# Patient Record
Sex: Male | Born: 1955 | Race: Black or African American | Hispanic: No | Marital: Married | Smoking: Former smoker
Health system: Southern US, Community
[De-identification: ages and names within clinical notes are randomized; demographics above are authoritative.]

## PROBLEM LIST (undated history)

## (undated) DIAGNOSIS — M1990 Unspecified osteoarthritis, unspecified site: Secondary | ICD-10-CM

## (undated) DIAGNOSIS — Z5189 Encounter for other specified aftercare: Secondary | ICD-10-CM

## (undated) DIAGNOSIS — T7840XA Allergy, unspecified, initial encounter: Secondary | ICD-10-CM

## (undated) DIAGNOSIS — R011 Cardiac murmur, unspecified: Secondary | ICD-10-CM

## (undated) DIAGNOSIS — E785 Hyperlipidemia, unspecified: Secondary | ICD-10-CM

## (undated) DIAGNOSIS — J45909 Unspecified asthma, uncomplicated: Secondary | ICD-10-CM

## (undated) DIAGNOSIS — I10 Essential (primary) hypertension: Secondary | ICD-10-CM

## (undated) DIAGNOSIS — K5900 Constipation, unspecified: Secondary | ICD-10-CM

## (undated) HISTORY — DX: Constipation, unspecified: K59.00

## (undated) HISTORY — DX: Essential (primary) hypertension: I10

## (undated) HISTORY — DX: Allergy, unspecified, initial encounter: T78.40XA

## (undated) HISTORY — PX: APPENDECTOMY: SHX54

## (undated) HISTORY — DX: Encounter for other specified aftercare: Z51.89

## (undated) HISTORY — PX: TONSILLECTOMY: SUR1361

## (undated) HISTORY — PX: KNEE SURGERY: SHX244

## (undated) HISTORY — DX: Unspecified osteoarthritis, unspecified site: M19.90

## (undated) HISTORY — DX: Hyperlipidemia, unspecified: E78.5

## (undated) HISTORY — PX: OTHER SURGICAL HISTORY: SHX169

## (undated) HISTORY — DX: Cardiac murmur, unspecified: R01.1

## (undated) HISTORY — DX: Unspecified asthma, uncomplicated: J45.909

---

## 1980-04-30 HISTORY — PX: KNEE SURGERY: SHX244

## 2005-07-21 ENCOUNTER — Emergency Department (HOSPITAL_COMMUNITY): Admission: EM | Admit: 2005-07-21 | Discharge: 2005-07-21 | Payer: Self-pay | Admitting: Emergency Medicine

## 2006-04-30 HISTORY — PX: COLONOSCOPY: SHX174

## 2007-02-06 ENCOUNTER — Ambulatory Visit: Payer: Self-pay | Admitting: Gastroenterology

## 2007-02-17 ENCOUNTER — Ambulatory Visit: Payer: Self-pay | Admitting: Gastroenterology

## 2009-04-15 ENCOUNTER — Encounter (INDEPENDENT_AMBULATORY_CARE_PROVIDER_SITE_OTHER): Payer: Self-pay | Admitting: *Deleted

## 2009-05-09 DIAGNOSIS — E669 Obesity, unspecified: Secondary | ICD-10-CM | POA: Insufficient documentation

## 2009-05-09 DIAGNOSIS — Z8679 Personal history of other diseases of the circulatory system: Secondary | ICD-10-CM | POA: Insufficient documentation

## 2009-05-09 DIAGNOSIS — K921 Melena: Secondary | ICD-10-CM | POA: Insufficient documentation

## 2009-05-09 DIAGNOSIS — I10 Essential (primary) hypertension: Secondary | ICD-10-CM | POA: Insufficient documentation

## 2009-05-12 ENCOUNTER — Ambulatory Visit: Payer: Self-pay | Admitting: Gastroenterology

## 2009-05-17 ENCOUNTER — Ambulatory Visit: Payer: Self-pay | Admitting: Gastroenterology

## 2009-05-17 LAB — CONVERTED CEMR LAB: Fecal Occult Bld: NEGATIVE

## 2010-06-01 NOTE — Procedures (Signed)
Summary: Colon   Colonoscopy  Procedure date:  02/17/2007  Findings:      Location:  [facility].   Patient Name: Zachary [name], Zachary [name] MRN:  Procedure Procedures: Colonoscopy CPT: [phone].  Personnel: Endoscopist: Vania Rea. Jarold Motto, MD.  Referred By: Ellamae Sia, MD.  Exam Location: Exam performed in Outpatient Clinic. Outpatient  Patient Consent: Procedure, Alternatives, Risks and Benefits discussed, consent obtained, from patient. Consent was obtained by the RN.  Indications  Increased Risk Screening: For family history of colorectal neoplasia, in  grandparent  History  Current Medications: Patient is not currently taking Coumadin.  Medical/ Surgical History: Hypertension,  Pre-Exam Physical: Performed Feb 17, 2007. Cardio-pulmonary exam, Rectal exam, Abdominal exam, Extremity exam, Mental status exam WNL.  Comments: Pt. history reviewed/updated, physical exam performed prior to initiation of sedation? yes Exam Exam: Extent of exam reached: Cecum, extent intended: Cecum.  The cecum was identified by appendiceal orifice and IC valve. Time to Cecum: 00:06: 06. Time for Withdrawl: 00:04:04. Colon retroflexion performed. Images taken. ASA Classification: II. Tolerance: excellent.  Monitoring: Pulse and BP monitoring, Oximetry used. Supplemental O2 given. at 2 Liters.  Colon Prep Used Golytely for colon prep. Prep results: excellent.  Sedation Meds: Patient assessed and found to be appropriate for moderate (conscious) sedation. Sedation was managed by the Endoscopist. Monitored Anesthesia Care. Fentanyl 125 mcg. given IV. Versed 12 given IV. Benadryl 25 given IV.  Instrument(s): CF 140L. Serial [id number].  Findings - NORMAL EXAM: Cecum to Rectum. Not Seen: Polyps. AVM's. Colitis. Tumors. Melanosis. Crohn's. Diverticulosis. Hemorrhoids.   Assessment Normal examination.  Events  Unplanned Interventions: No intervention was required.   Plans Medication Plan: Continue current medications.  Patient Education: Patient given standard instructions for: Patient instructed to get routine colonoscopy every 10 years.  Disposition: After procedure patient sent to recovery. After recovery patient sent home.  Scheduling/Referral: Follow-Up prn.    This report was created from the original endoscopy report, which was reviewed and signed by the above listed endoscopist.

## 2010-06-01 NOTE — Assessment & Plan Note (Signed)
Summary: HEM POS STOOLS/YF    History of Present Illness Visit Type: consult  Primary GI MD: Sheryn Bison MD FACP FAGA Primary Provider: Robert Bellow, MD Requesting Provider: [name], MD Chief Complaint: Heme positive stool  History of Present Illness:   This patient is a 55 year old African American male referred through the courtesy of Dr. [name] for evaluation of guaiac positive stool determined at the time of physical examination of December 14. Apparently since that initial exam, the patient is return Hemoccult cards are being guaiac negative, and hemoglobin was normal at 15. The patient described occasional hard stool but no real rectal bleeding, melena, or abdominal pain or upper gastrointestinal or hepatobiliary complaints. He had a negative screening colonoscopy by myself in October 2008. He has a family history of colon cancer in his grandmother at an elderly age. He does take daily aspirin and other medications for hypertension.   GI Review of Systems      Denies abdominal pain, acid reflux, belching, bloating, chest pain, dysphagia with liquids, dysphagia with solids, heartburn, loss of appetite, nausea, vomiting, vomiting blood, weight loss, and  weight gain.      Reports heme positive stool.     Denies anal fissure, black tarry stools, change in bowel habit, constipation, diarrhea, diverticulosis, fecal incontinence, hemorrhoids, irritable bowel syndrome, jaundice, light color stool, liver problems, rectal bleeding, and  rectal pain.    Current Medications (verified): 1)  Norvasc 5 Mg Tabs (Amlodipine Besylate) .Marland Kitchen.. 1 By Mouth Qd 2)  Metoprolol Tartrate 100 Mg Tabs (Metoprolol Tartrate) .Marland Kitchen.. 1 By Mouth Qd 3)  Cardura 4 Mg Tabs (Doxazosin Mesylate) .Marland Kitchen.. 1 By Mouth Qd 4)  Aspirin 325 Mg  Tabs (Aspirin) .... Two Tablets By Mouth Daily in The Morning  Allergies (verified): 1)  ! Penicillin  Past History:  Past medical, surgical, family and social histories  (including risk factors) reviewed for relevance to current acute and chronic problems.  Past Medical History: Reviewed history from 05/09/2009 and no changes required. Current Problems:  HEART MURMUR, HX OF (ICD-V12.50) OBESITY (ICD-278.00) HEMOCCULT POSITIVE STOOL (ICD-578.1) HYPERTENSION (ICD-401.9)  Past Surgical History: Appendectomy Left Knee Arthroscopy Tonsillectomy  Family History: Reviewed history from 05/09/2009 and no changes required. Family History of Diabetes: Father Family History of Colon Cancer:Maternal Uncle  Social History: Reviewed history from 05/09/2009 and no changes required. Occupation: Unemployed---Going to school  Married Childern  Patient is a former smoker.  Alcohol Use - no Illicit Drug Use - no  Smoking Status:  quit  Review of Systems       The patient complains of anxiety-new.  The patient denies allergy/sinus, anemia, arthritis/joint pain, back pain, blood in urine, breast changes/lumps, change in vision, confusion, cough, coughing up blood, depression-new, fainting, fatigue, fever, headaches-new, hearing problems, heart murmur, heart rhythm changes, itching, muscle pains/cramps, night sweats, nosebleeds, shortness of breath, skin rash, sleeping problems, sore throat, swelling of feet/legs, swollen lymph glands, thirst - excessive, urination - excessive, urination changes/pain, urine leakage, vision changes, and voice change.    Vital Signs:  Patient profile:   55 year old male Height:      72 inches Weight:      343 pounds BMI:     46.69 BSA:     2.68 Pulse rate:   92 / minute Pulse rhythm:   regular BP sitting:   136 / 84  (left arm) Cuff size:   regular  Vitals Entered By: Ok Anis CMA (May 12, 2009 8:59 AM)  Physical  Exam  General:  Well developed, well nourished, no acute distress.He is morbidly obese with a BMI of 47. Head:  Normocephalic and atraumatic. Eyes:  PERRLA, no icterus.exam deferred to patient's  ophthalmologist.   Lungs:  Clear throughout to auscultation. Heart:  Regular rate and rhythm; no murmurs, rubs,  or bruits. Abdomen:  Soft, nontender and nondistended. No masses, hepatosplenomegaly or hernias noted. Normal bowel sounds. Extremities:  No clubbing, cyanosis, edema or deformities noted. Neurologic:  Alert and  oriented x4;  grossly normal neurologically. Skin:  Intact without significant lesions or rashes. Psych:  Alert and cooperative. Normal mood and affect.   Impression & Recommendations:  Problem # 1:  HEMOCCULT POSITIVE STOOL (ICD-578.1) Assessment Improved He relates that this was most likely traumatic. I suspect he did have a false positive Hemoccult perhaps related to aspirin use. I reviewed his colonoscopy and this was a normal exam with a good prep. I will repeat HemeSelect cards for human hemoglobin and proceed accordingly. The patient is very likely to repeat colonoscopy at this time.  Problem # 2:  OBESITY (ICD-278.00) Assessment: Unchanged Consideration possible bariatric surgery should be entertained if primary care feels this is appropriate.  Patient Instructions: 1)  Copy sent to : Dr. [name] 2)  Please continue current medications 3)  Outpatient Hemeselect cards and repeat colonoscopy-endoscopy if these are positive.Marland Kitchen  4)  The medication list was reviewed and reconciled.  All changed / newly prescribed medications were explained.  A complete medication list was provided to the patient / caregiver.

## 2010-06-01 NOTE — Letter (Signed)
Summary: [facility]: Immunoassay Fecal Occult Blood (iFOB) Order Form  [facility]  [address] [place], [place] [address]   Phone: [phone]  Fax: [phone]      [facility]: Immunoassay Fecal Occult Blood (iFOB) Order Form   May 12, 2009 MRN: [id number]   Zachary [name] [birth year]   Physicican Name: Patterson_________________________  Diagnosis Code:_792.1_________________________      Ashok Cordia RN

## 2010-09-15 NOTE — H&P (Signed)
Zachary, [name] NO.:  [id number]   MEDICAL RECORD NO.:  [id number]          PATIENT TYPE:  EMS   LOCATION:  [facility]                         FACILITY:  [facility]   PHYSICIAN:  Nicki Guadalajara, M.D.     DATE OF BIRTH:  Jun 28, 1955   DATE OF ADMISSION:  07/21/2005  DATE OF DISCHARGE:  07/21/2005                                HISTORY & PHYSICAL   CHIEF COMPLAINT:  Chest pain.   HISTORY OF PRESENT ILLNESS:  [name] is a 55 year old male who was  referred to [place] by Dr. Lesle Chris at [facility]. He has been having  chest pain on and off for a couple of days. He describes a vague, midsternal  chest pressure. It lasts less than a minute. He recently had a friend die  suddenly of a heart attack at age 86. This clearly continues to upset the  patient. The patient himself has no prior history of coronary disease or  chest pain. He did have an echocardiogram in our office in the past that  showed normal LV function with LVH.   PAST MEDICAL HISTORY:  Remarkable for hypertension. He has had a remote  appendectomy and a remote tonsillectomy. He denies diabetes or  hyperlipidemia.   CURRENT MEDICATIONS:  1.  Doxazosin 4 mg a day.  2.  Toprol-XL 100 mg a day.  3.  Hydrochlorothiazide 25 mg a day.  4.  Wellbutrin 300 mg a day.   He is allergic to PENICILLIN.   SOCIAL HISTORY:  He is married. He has two daughters 67 and 58. He is a  nonsmoker. He works as a Landscape architect.   FAMILY HISTORY:  Remarkable in that his father died at 63 of complications  of diabetes and coronary disease. His mother is alive and well. His sister  had a history of hypertension.   REVIEW OF SYSTEMS:  He does have a history of BPH. He denies any GI bleeding  or melena. He has not had syncope or sustained tachycardia. He denies any  dyspnea on exertion. He does say he has been under some stress at work and  says he worked the last 24 hours straight.   PHYSICAL EXAMINATION:  VITAL SIGNS:  Blood  pressure 134/68, pulse 80,  temperature 98.8, respirations 12.  GENERAL:  He is a well-developed, morbidly-obese African-American male in no  acute distress.  HEENT:  Normocephalic. Extraocular movements are intact. Sclerae are  nonicteric. Lids and conjunctivae are within normal limits.  NECK:  Without bruit and without JVD.  CHEST:  Clear to auscultation and percussion  CARDIAC:  Reveals regular rate and rhythm without murmur, rub, or gallop.  Normal S1 and S2.  ABDOMEN:  Morbidly obese, nontender.  EXTREMITIES:  Without edema. Distal pulses are intact.   EKG shows sinus rhythm without acute changes. Initial troponin is negative.  Sodium 138, potassium 4.3, BUN 15, creatinine 1.2. Hemoglobin 16.3.   IMPRESSION:  1.  Chest pain, some atypical features.  2.  Family history of diabetes.  3.  Hypertension.  4.  Morbid obesity.  5.  Benign prostatic  hypertrophy.   PLAN:  The patient will be seen by Dr. Tresa Endo today in the emergency room.  The patient is anxious to go home tonight to attend an affair he has  scheduled with his daughter. Dr. Tresa Endo will decide whether he can go home  from the emergency room after another set of cardiac markers and have an  outpatient Cardiolite.      Abelino Derrick, P.A.    ______________________________  Nicki Guadalajara, M.D.    Lenard Lance  D:  07/21/2005  T:  07/23/2005  Job:  [id number]

## 2011-12-18 ENCOUNTER — Other Ambulatory Visit: Payer: Self-pay | Admitting: Internal Medicine

## 2011-12-18 NOTE — Telephone Encounter (Signed)
Chart pulled [id number]

## 2011-12-19 NOTE — Telephone Encounter (Signed)
Needs office visit.

## 2012-01-12 ENCOUNTER — Other Ambulatory Visit: Payer: Self-pay | Admitting: Internal Medicine

## 2012-01-13 ENCOUNTER — Other Ambulatory Visit: Payer: Self-pay | Admitting: Internal Medicine

## 2012-01-13 NOTE — Telephone Encounter (Signed)
Need chart

## 2012-01-14 NOTE — Telephone Encounter (Signed)
Patient's chart is at the nurses station in the pa pool pile.  [facility] [id number]

## 2012-01-19 ENCOUNTER — Other Ambulatory Visit: Payer: Self-pay | Admitting: Internal Medicine

## 2012-01-20 ENCOUNTER — Telehealth: Payer: Self-pay

## 2012-01-20 MED ORDER — METOPROLOL SUCCINATE ER 100 MG PO TB24: 100.0000 mg | ORAL_TABLET | Freq: Every day | ORAL | Status: DC

## 2012-01-20 NOTE — Telephone Encounter (Signed)
Pt is needing to talk with someone about his blood pressure medication and getting refilled for another month before coming in to be seen  Best number [phone]

## 2012-01-20 NOTE — Telephone Encounter (Signed)
Metoprolol needs to be refilled for one more month, then it will be in line with his other meds. Patient will be coming in in 4 weeks for his regular check up and med refills.  Uses [facility] on [address]....has four tabs left.

## 2012-01-20 NOTE — Telephone Encounter (Signed)
Left message for patient to call back  

## 2012-01-20 NOTE — Telephone Encounter (Signed)
Rx sent 

## 2012-01-21 NOTE — Telephone Encounter (Signed)
Called patient to advise  °

## 2012-01-21 NOTE — Telephone Encounter (Signed)
Chart pulled to PA pool at nurse station [phone]

## 2012-02-09 ENCOUNTER — Ambulatory Visit (INDEPENDENT_AMBULATORY_CARE_PROVIDER_SITE_OTHER): Payer: BC Managed Care – PPO | Admitting: Internal Medicine

## 2012-02-09 VITALS — BP 135/80 | HR 93 | Temp 98.1°F | Resp 16 | Ht 72.0 in | Wt 336.0 lb

## 2012-02-09 DIAGNOSIS — L259 Unspecified contact dermatitis, unspecified cause: Secondary | ICD-10-CM

## 2012-02-09 DIAGNOSIS — L309 Dermatitis, unspecified: Secondary | ICD-10-CM

## 2012-02-09 DIAGNOSIS — E669 Obesity, unspecified: Secondary | ICD-10-CM

## 2012-02-09 DIAGNOSIS — I10 Essential (primary) hypertension: Secondary | ICD-10-CM

## 2012-02-09 DIAGNOSIS — N400 Enlarged prostate without lower urinary tract symptoms: Secondary | ICD-10-CM

## 2012-02-09 DIAGNOSIS — E785 Hyperlipidemia, unspecified: Secondary | ICD-10-CM

## 2012-02-09 LAB — COMPREHENSIVE METABOLIC PANEL
ALT: 28 U/L (ref 0–53)
AST: 22 U/L (ref 0–37)
Albumin: 4 g/dL (ref 3.5–5.2)
Alkaline Phosphatase: 50 U/L (ref 39–117)
BUN: 13 mg/dL (ref 6–23)
CO2: 27 mEq/L (ref 19–32)
Creat: 0.87 mg/dL (ref 0.50–1.35)
Potassium: 4.4 mEq/L (ref 3.5–5.3)
Sodium: 136 mEq/L (ref 135–145)

## 2012-02-09 LAB — LIPID PANEL
LDL Cholesterol: 108 mg/dL — ABNORMAL HIGH (ref 0–99)
Total CHOL/HDL Ratio: 5 Ratio
VLDL: 23 mg/dL (ref 0–40)

## 2012-02-09 LAB — POCT CBC
HCT, POC: 45.2 % (ref 43.5–53.7)
MID (cbc): 0.5 (ref 0–0.9)
RBC: 5.1 M/uL (ref 4.69–6.13)
RDW, POC: 15 %

## 2012-02-09 MED ORDER — FLUOCINONIDE-E 0.05 % EX CREA: TOPICAL_CREAM | Freq: Two times a day (BID) | CUTANEOUS | Status: DC

## 2012-02-09 MED ORDER — AMLODIPINE BESYLATE 5 MG PO TABS: 5.0000 mg | ORAL_TABLET | Freq: Every day | ORAL | Status: DC

## 2012-02-09 MED ORDER — LOSARTAN POTASSIUM-HCTZ 50-12.5 MG PO TABS: 1.0000 | ORAL_TABLET | Freq: Every day | ORAL | Status: DC

## 2012-02-09 MED ORDER — METOPROLOL SUCCINATE ER 100 MG PO TB24: 100.0000 mg | ORAL_TABLET | Freq: Every day | ORAL | Status: DC

## 2012-02-09 MED ORDER — DOXAZOSIN MESYLATE 4 MG PO TABS: 4.0000 mg | ORAL_TABLET | Freq: Every day | ORAL | Status: DC

## 2012-02-09 NOTE — Patient Instructions (Addendum)
1) Follow exercise program as discussed and be mindful with progression as to prevent injuries.     2) will send your labs with recommendations

## 2012-02-09 NOTE — Progress Notes (Addendum)
Subjective:    Patient ID: Zachary [name], male    DOB: [birth year], 56 y.o.   MRN: [id number]  HPI He says he is here to have a form filled out for the [facility] that allows him to keep driving. He was put on a list of a few years ago for hypertension for some reason that he is unclear about. He was last cleared in this office 14 months ago by Dr.Daub-He has had no followup visit since. He continues medication for hypertension. He's been unable to attain significant weight loss. He is in school full-time or part-time with a job and expects to finish in 2016.He says that leaves no time for exercise.He is uncertain about past cholesterol levels. Old lab data  in the paper chart reveal LDL approaching 150. He had a normal hemoglobin A1c in August 2012.  Medications include amlodipine Hyzaar Toprol and Cardura. The latter drug is treating benign prostate hypertrophy. It has helped with nocturia.  He is unsure about his immunizations Declines flu vaccine Unsure about Pneumovax Had colonoscopy to evaluate Hemoccult-positive stool which he reports is normal/We have no reports in this chart. Although he gets his medicines from [place] it is unclear who his primary care provider actually is.  He is a bus driver  Review of Systems  Constitutional: Negative for fever, activity change, appetite change, fatigue and unexpected weight change.  HENT: Negative for hearing loss, trouble swallowing, neck pain and dental problem.   Eyes: Negative for visual disturbance.  Respiratory: Negative for cough, choking, chest tightness and shortness of breath.   Cardiovascular: Positive for leg swelling. Negative for chest pain and palpitations.       Occasional mild edema at the end of the day  Gastrointestinal: Negative for abdominal pain, diarrhea, constipation and blood in stool.  Genitourinary: Negative for frequency, enuresis and difficulty urinating.  Musculoskeletal: Negative for back pain, joint swelling and gait  problem.  Skin: Positive for rash.       For 4 weeks he has had an itchy rash along the left aspect of the hand that is somewhat vesicular/this is not improved with over-the-counter preparations  Neurological: Negative for dizziness and headaches.  Psychiatric/Behavioral: Negative for suicidal ideas, behavioral problems, confusion, disturbed wake/sleep cycle and dysphoric mood. The patient is not nervous/anxious.        Objective:   Physical Exam Morbidly obese-336 pounds BMI 45 Filed Vitals:   02/09/12 0818  BP: 135/80  Pulse: 93  Temp: 98.1 F (36.7 C)  Resp: 16  Pulse ox 99% HEENT clear No thyromegaly Heart regular without murmurs. No carotid bruits Lungs clear Extremities with trace edema at the ankles Mood boisterous/happy Oriented to time person and place Cranial nerves intact Gait normal Skin-left hand with mildly erythematous papular confluent rash over the hypothenar area       Assessment & Plan:  Problem #1 hypertension Problem #2 morbid obesity Problem #3 BPH Problem #4 eczema Problem #5 probable hyperlipidemia  Routine labs ordered Meds ordered this encounter  Medications  . metoprolol succinate (TOPROL-XL) 100 MG 24 hr tablet    Sig: Take 1 tablet (100 mg total) by mouth daily. Take with or immediately following a meal.    Dispense:  90 tablet    Refill:  3  . losartan-hydrochlorothiazide (HYZAAR) 50-12.5 MG per tablet    Sig: Take 1 tablet by mouth daily.    Dispense:  90 tablet    Refill:  3    Needs office visit and labs  .  doxazosin (CARDURA) 4 MG tablet    Sig: Take 1 tablet (4 mg total) by mouth at bedtime.    Dispense:  90 tablet    Refill:  3    Needs office visit  . amLODipine (NORVASC) 5 MG tablet    Sig: Take 1 tablet (5 mg total) by mouth daily.    Dispense:  90 tablet    Refill:  3    Needs office visit  . fluocinonide-emollient (LIDEX-E) 0.05 % cream    Sig: Apply topically 2 (two) times daily.    Dispense:  30 g    Refill:   0   Recommend followup for full physical by appointment at 104 Patient Instructions  1) Follow exercise program as discussed(Lauren Deretha [facility] and personal trainer went over an internet based program of gradually increasing intensity) and be mindful with progression as to prevent injuries.     2) will send your labs with recommendations  3) Begin decreasing calories-I discussed with him the seriousness of his metabolic risk factors and the likelihood of progression to diabetes and the significant risk for early death.  OV

## 2012-02-09 NOTE — Addendum Note (Signed)
Addended by: Tonye Pearson on: 02/09/2012 08:29 PM   Modules accepted: Level of Service

## 2012-02-11 ENCOUNTER — Encounter: Payer: Self-pay | Admitting: Internal Medicine

## 2012-04-21 ENCOUNTER — Other Ambulatory Visit: Payer: Self-pay | Admitting: Physician Assistant

## 2012-05-24 ENCOUNTER — Ambulatory Visit (INDEPENDENT_AMBULATORY_CARE_PROVIDER_SITE_OTHER): Payer: BC Managed Care – PPO | Admitting: Family Medicine

## 2012-05-24 VITALS — BP 180/102 | HR 88 | Temp 98.2°F | Resp 18 | Wt 336.0 lb

## 2012-05-24 DIAGNOSIS — R509 Fever, unspecified: Secondary | ICD-10-CM

## 2012-05-24 DIAGNOSIS — Z23 Encounter for immunization: Secondary | ICD-10-CM

## 2012-05-24 DIAGNOSIS — I10 Essential (primary) hypertension: Secondary | ICD-10-CM

## 2012-05-24 DIAGNOSIS — J029 Acute pharyngitis, unspecified: Secondary | ICD-10-CM

## 2012-05-24 LAB — POCT RAPID STREP A (OFFICE): Rapid Strep A Screen: NEGATIVE

## 2012-05-24 NOTE — Patient Instructions (Addendum)
Your strep test was negative,  Your should receive a call or letter about your lab results within the next week to 10 days. Sore throat lozenges if needed, drink plenty of fluids Keep a record of your blood pressures outside of the office and bring them to the next office visit, and recheck if your pressures remain elevated.    Return to the clinic or go to the nearest emergency room if any of your symptoms worsen or new symptoms occur.   Sore Throat Sore throats may be caused by bacteria and viruses. They may also be caused by:  Smoking.  Pollution.  Allergies. If a sore throat is due to strep infection (a bacterial infection), you may need:  A throat swab.  A culture test to verify the strep infection. You will need one of these:  An antibiotic shot.  Oral medicine for a full 10 days. Strep infection is very contagious. A doctor should check any close contacts who have a sore throat or fever. A sore throat caused by a virus infection will usually last only 3-4 days. Antibiotics will not treat a viral sore throat.  Infectious mononucleosis (a viral disease), however, can cause a sore throat that lasts for up to 3 weeks. Mononucleosis can be diagnosed with blood tests. You must have been sick for at least 1 week in order for the test to give accurate results. HOME CARE INSTRUCTIONS   To treat a sore throat, take mild pain medicine.  Increase your fluids.  Eat a soft diet.  Do not smoke.  Gargling with warm water or salt water (1 tsp. salt in 8 oz. water) can be helpful.  Try throat sprays or lozenges or sucking on hard candy to ease the symptoms. Call your doctor if your sore throat lasts longer than 1 week.  SEEK IMMEDIATE MEDICAL CARE IF:  You have difficulty breathing.  You have increased swelling in the throat.  You have pain so severe that you are unable to swallow fluids or your saliva.  You have a severe headache, a high fever, vomiting, or a red  rash. Document Released: 05/24/2004 Document Revised: 07/09/2011 Document Reviewed: 04/03/2007 [facility] Patient Information 2013 [place], [place].

## 2012-05-24 NOTE — Progress Notes (Signed)
Subjective:    Patient ID: Zachary [name], male    DOB: 1955/08/27, 57 y.o.   MRN: [id number]  HPI Zachary [name] is a 57 y.o. male  8 days ago - noticed fever of 102 and sore throat. Swollen lymph nodes initially.  Improved ov er past week, fever resolved 2 days later, but still some sore throat - dry/scratchy now. No recent fever.   HTN - usually controlled, didn't take meds yet this morning.   No flu vaccine this year. No known sick contacts.  Tx: alkaseltzer, otc pain pills.    Review of Systems  HENT: Positive for sore throat and postnasal drip. Negative for congestion and rhinorrhea.   Respiratory: Negative for cough, chest tightness and shortness of breath.   Cardiovascular: Negative for chest pain and palpitations.  Neurological: Negative for dizziness, syncope, speech difficulty, weakness, light-headedness and headaches.       Objective:   Physical Exam  Vitals reviewed. Constitutional: He is oriented to person, place, and time. He appears well-developed and well-nourished.  HENT:  Head: Normocephalic and atraumatic.  Right Ear: Tympanic membrane, external ear and ear canal normal.  Left Ear: Tympanic membrane, external ear and ear canal normal.  Nose: No rhinorrhea.  Mouth/Throat: Mucous membranes are normal. Posterior oropharyngeal erythema present. No oropharyngeal exudate, posterior oropharyngeal edema or tonsillar abscesses.  Eyes: Conjunctivae normal are normal. Pupils are equal, round, and reactive to light.  Neck: Trachea normal and normal range of motion. Neck supple. No thyromegaly present.    Cardiovascular: Normal rate, regular rhythm, normal heart sounds and intact distal pulses.   No murmur heard. Pulmonary/Chest: Effort normal and breath sounds normal. He has no wheezes. He has no rhonchi. He has no rales.  Abdominal: Soft. There is no tenderness.  Lymphadenopathy:    He has cervical adenopathy.  Neurological: He is alert and oriented to person,  place, and time.  Skin: Skin is warm and dry. No rash noted.  Psychiatric: He has a normal mood and affect. His behavior is normal.    Results for orders placed in visit on 05/24/12  POCT RAPID STREP A (OFFICE)      Component Value Range   Rapid Strep A Screen Negative  Negative      Assessment & Plan:  Zachary [name] is a 57 y.o. male 1. Sore throat  POCT rapid strep A, Throat culture ([facility])  2. HTN (hypertension)    3. Fever     Sore throat with initial fever and swollen lymph node, late presentation now improving and afebrile, resolving LAD and other symptoms.  Negative strep testing, but will send cx.  DDX includes influenza, but testing deferred as improved and outside timeframe of Tamiflu. Flu vaccine requested and given.  Sx care, rtc precautions discussed.   HTN - off meds this am, asx.  Rtc/er precautions. Restart meds. See below.   Patient Instructions  Your strep test was negative,  Your should receive a call or letter about your lab results within the next week to 10 days. Sore throat lozenges if needed, drink plenty of fluids Keep a record of your blood pressures outside of the office and bring them to the next office visit, and recheck if your pressures remain elevated.    Return to the clinic or go to the nearest emergency room if any of your symptoms worsen or new symptoms occur.   Sore Throat Sore throats may be caused by bacteria and viruses. They may also be caused  by:  Smoking.  Pollution.  Allergies. If a sore throat is due to strep infection (a bacterial infection), you may need:  A throat swab.  A culture test to verify the strep infection. You will need one of these:  An antibiotic shot.  Oral medicine for a full 10 days. Strep infection is very contagious. A doctor should check any close contacts who have a sore throat or fever. A sore throat caused by a virus infection will usually last only 3-4 days. Antibiotics will not treat a viral sore  throat.  Infectious mononucleosis (a viral disease), however, can cause a sore throat that lasts for up to 3 weeks. Mononucleosis can be diagnosed with blood tests. You must have been sick for at least 1 week in order for the test to give accurate results. HOME CARE INSTRUCTIONS   To treat a sore throat, take mild pain medicine.  Increase your fluids.  Eat a soft diet.  Do not smoke.  Gargling with warm water or salt water (1 tsp. salt in 8 oz. water) can be helpful.  Try throat sprays or lozenges or sucking on hard candy to ease the symptoms. Call your doctor if your sore throat lasts longer than 1 week.  SEEK IMMEDIATE MEDICAL CARE IF:  You have difficulty breathing.  You have increased swelling in the throat.  You have pain so severe that you are unable to swallow fluids or your saliva.  You have a severe headache, a high fever, vomiting, or a red rash. Document Released: 05/24/2004 Document Revised: 07/09/2011 Document Reviewed: 04/03/2007 [facility] Patient Information 2013 [place], [place].

## 2012-05-26 LAB — CULTURE, GROUP A STREP

## 2012-08-20 ENCOUNTER — Other Ambulatory Visit: Payer: Self-pay | Admitting: Physician Assistant

## 2013-02-17 ENCOUNTER — Other Ambulatory Visit: Payer: Self-pay | Admitting: Internal Medicine

## 2013-02-21 ENCOUNTER — Ambulatory Visit (INDEPENDENT_AMBULATORY_CARE_PROVIDER_SITE_OTHER): Payer: BC Managed Care – PPO | Admitting: Emergency Medicine

## 2013-02-21 VITALS — BP 126/86 | HR 76 | Temp 98.1°F | Resp 18 | Ht 72.5 in | Wt 335.6 lb

## 2013-02-21 DIAGNOSIS — Z23 Encounter for immunization: Secondary | ICD-10-CM

## 2013-02-21 DIAGNOSIS — R0683 Snoring: Secondary | ICD-10-CM

## 2013-02-21 DIAGNOSIS — I10 Essential (primary) hypertension: Secondary | ICD-10-CM

## 2013-02-21 DIAGNOSIS — E785 Hyperlipidemia, unspecified: Secondary | ICD-10-CM

## 2013-02-21 DIAGNOSIS — R0609 Other forms of dyspnea: Secondary | ICD-10-CM

## 2013-02-21 DIAGNOSIS — R0989 Other specified symptoms and signs involving the circulatory and respiratory systems: Secondary | ICD-10-CM

## 2013-02-21 DIAGNOSIS — R21 Rash and other nonspecific skin eruption: Secondary | ICD-10-CM

## 2013-02-21 LAB — POCT CBC
Lymph, poc: 1.8 (ref 0.6–3.4)
MCH, POC: 28.2 pg (ref 27–31.2)
MCHC: 31.3 g/dL — AB (ref 31.8–35.4)
MCV: 90.3 fL (ref 80–97)
POC Granulocyte: 3.9 (ref 2–6.9)
Platelet Count, POC: 181 10*3/uL (ref 142–424)
RDW, POC: 15.6 %
WBC: 6.2 10*3/uL (ref 4.6–10.2)

## 2013-02-21 LAB — COMPREHENSIVE METABOLIC PANEL
AST: 26 U/L (ref 0–37)
CO2: 28 mEq/L (ref 19–32)
Chloride: 102 mEq/L (ref 96–112)
Total Bilirubin: 0.8 mg/dL (ref 0.3–1.2)
Total Protein: 7.8 g/dL (ref 6.0–8.3)

## 2013-02-21 LAB — LIPID PANEL
LDL Cholesterol: 122 mg/dL — ABNORMAL HIGH (ref 0–99)
Triglycerides: 50 mg/dL (ref <150)
VLDL: 10 mg/dL (ref 0–40)

## 2013-02-21 MED ORDER — LOSARTAN POTASSIUM-HCTZ 50-12.5 MG PO TABS: 1.0000 | ORAL_TABLET | Freq: Every day | ORAL | Status: DC

## 2013-02-21 MED ORDER — BETAMETHASONE DIPROPIONATE AUG 0.05 % EX CREA: TOPICAL_CREAM | Freq: Two times a day (BID) | CUTANEOUS | Status: DC

## 2013-02-21 MED ORDER — METOPROLOL SUCCINATE ER 100 MG PO TB24: ORAL_TABLET | ORAL | Status: DC

## 2013-02-21 MED ORDER — DOXAZOSIN MESYLATE 4 MG PO TABS: ORAL_TABLET | ORAL | Status: DC

## 2013-02-21 MED ORDER — AMLODIPINE BESYLATE 5 MG PO TABS: ORAL_TABLET | ORAL | Status: DC

## 2013-02-21 NOTE — Progress Notes (Signed)
Subjective:  This chart was scribed for Zachary Staggers, MD by [name], Medical Scribe. This patient was seen in Room 10 and the patient's care was started at 8:29 AM.    Patient ID: Zachary [name], male    DOB: Sep 12, 1955, 57 y.o.   MRN: [id number]  HPI HPI Comments: Zachary [name] is a 57 y.o. male with a history of hypertension who presents to the [facility] needing an update on his prescription blood pressure medications, a flu shot, and a follow up for his medical problems.  The patient denies having a history of DM but states that he has a family history of DM.  He states that he does not currently use insulin.  He states that his cholesterol was slightly elevated last time he came for a physical.  He states that he was tested for sleep apnea 5 years ago at a clinic across the street at [facility] but the results were negative.  The patient states that his sister has sleep apnea.  The patient states that he had an EKG "a long time ago".  The patient states that he just started walking daily to increase his level of physical activity.  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  He also states that he is currently going to school for medical management.  He states that he is otherwise in good physical health. Patient also has a rash on the lateral side of his left hand he would like checked    Past Medical History  Diagnosis Date  . Asthma   . Hypertension   . Allergy   . Arthritis    Past Surgical History  Procedure Laterality Date  . Appendectomy     Family History  Problem Relation Age of Onset  . Hypertension Mother   . Stroke Father    History   Social History  . Marital Status: Married    Spouse Name: N/A    Number of Children: N/A  . Years of Education: N/A   Occupational History  . Not on file.   Social History Main Topics  . Smoking status: Former Smoker    Quit date: 02/09/1979  . Smokeless tobacco: Not on file  . Alcohol Use: No  . Drug Use: No  . Sexual Activity: Yes   Other Topics Concern  . Not on file   Social History Narrative  . No narrative on file   Allergies  Allergen Reactions  . Penicillins Rash   Filed Vitals:   02/21/13 0816  BP: 126/86  Pulse: 76  Temp: 98.1 F (36.7 C)  TempSrc: Oral  Resp: 18  Height: 6' 0.5" (1.842 m)  Weight: 335 lb 9.6 oz (152.227 kg)  SpO2: 98%     Review of Systems     Objective:   Physical Exam HEENT exam is unremarkable. His neck is supple. His chest was clear to auscultation and percussion. His cardiac exam irregular rate without murmurs rubs or gallops. His extremities are without edema.  DIAGNOSTIC STUDIES: Oxygen Saturation is 98% on room air, normal by my interpretation.    COORDINATION OF CARE: 8:35 AM- Discussed obtaining an EKG and blood work and refilling the prescriptions for the patient's blood pressure medication.  Discussed administering a flu shot in [facility].  The patient agreed to the treatment plan.   EKG left ventricular hypertrophy. Results for  orders placed in visit on 02/21/13  POCT CBC      Result Value Range   WBC 6.2  4.6 - 10.2 K/uL   Lymph, poc 1.8  0.6 - 3.4   POC LYMPH PERCENT 29.8  10 - 50 %L   MID (cbc) 0.5  0 - 0.9   POC MID % 7.3  0 - 12 %M   POC Granulocyte 3.9  2 - 6.9   Granulocyte percent 62.9  37 - 80 %G   RBC 5.24  4.69 - 6.13 M/uL   Hemoglobin 14.8  14.1 - 18.1 g/dL   HCT, POC 16.1  09.6 - 53.7 %   MCV 90.3  80 - 97 fL   MCH, POC 28.2  27 - 31.2 pg   MCHC 31.3 (*) 31.8 - 35.4 g/dL   RDW, POC 04.5     Platelet Count, POC 181  142 - 424 K/uL   MPV 9.6  0 - 99.8 fL  EKG shows left ventricular hypertrophy     Assessment & Plan:  All meds were refilled. The area on the side of his hand looks like a contact dermatitis and he was prescribed a steroid cream. KOH was done and pending. He declines to have a sleep study at the present  time but I do suspect he has sleep apnea. Problem List Items Addressed This Visit   None     I personally performed the services described in this documentation, which was scribed in my presence. The recorded information has been reviewed and is accurate.

## 2013-02-24 ENCOUNTER — Other Ambulatory Visit: Payer: Self-pay | Admitting: Emergency Medicine

## 2013-02-24 ENCOUNTER — Telehealth: Payer: Self-pay | Admitting: Family Medicine

## 2013-02-24 DIAGNOSIS — R9431 Abnormal electrocardiogram [ECG] [EKG]: Secondary | ICD-10-CM

## 2013-02-24 NOTE — Telephone Encounter (Signed)
Faxed a copy of EKG to Dr. Nadara Eaton on 02/24/2013

## 2013-03-21 ENCOUNTER — Other Ambulatory Visit: Payer: Self-pay | Admitting: Physician Assistant

## 2013-05-26 ENCOUNTER — Other Ambulatory Visit: Payer: Self-pay | Admitting: Physician Assistant

## 2013-07-25 ENCOUNTER — Ambulatory Visit (INDEPENDENT_AMBULATORY_CARE_PROVIDER_SITE_OTHER): Payer: BC Managed Care – PPO | Admitting: Family Medicine

## 2013-07-25 ENCOUNTER — Ambulatory Visit: Payer: BC Managed Care – PPO

## 2013-07-25 ENCOUNTER — Telehealth: Payer: Self-pay

## 2013-07-25 VITALS — BP 146/94 | HR 74 | Temp 97.8°F | Resp 18 | Ht 71.0 in | Wt 343.2 lb

## 2013-07-25 DIAGNOSIS — M25561 Pain in right knee: Secondary | ICD-10-CM

## 2013-07-25 DIAGNOSIS — M129 Arthropathy, unspecified: Secondary | ICD-10-CM

## 2013-07-25 DIAGNOSIS — M545 Low back pain, unspecified: Secondary | ICD-10-CM

## 2013-07-25 DIAGNOSIS — M25569 Pain in unspecified knee: Secondary | ICD-10-CM

## 2013-07-25 DIAGNOSIS — M1990 Unspecified osteoarthritis, unspecified site: Secondary | ICD-10-CM

## 2013-07-25 MED ORDER — TRAMADOL HCL 50 MG PO TABS: 50.0000 mg | ORAL_TABLET | Freq: Three times a day (TID) | ORAL | Status: DC | PRN

## 2013-07-25 NOTE — Progress Notes (Signed)
Zachary [name] is a 58 y.o. male who presents to Urgent Care today with complaints of soreness:  1.  Low back pain:  Present since October.  Presented here at that time, was prescirbed walking therapy to lose weight but his back bothered him too much to walk.  Did not bring up with physician at that time.  Back pain described as dull aching, worse with prolonged sitting and standing.  4/10 in severity.  Radiates to Left buttock.  Relieved with OTC analgesics.  No bladder/bowel incontinence  2.  Right knee pain:   Present x 1 week.  History of MCL tear in Left knee s/p footbal injury in college, never a problem with Right knee.  Describes sharp stabbing pain over medial aspect of knee.  Relieved with OTC alleve.  Getting better.  No locking, clicking, or giving out.  No injury, occurred suddenly and worse with prolonged inactivity  PMH reviewed.  Past Medical History  Diagnosis Date  . Asthma   . Hypertension   . Allergy   . Arthritis    Past Surgical History  Procedure Laterality Date  . Appendectomy      Medications reviewed. Current Outpatient Prescriptions  Medication Sig Dispense Refill  . amLODipine (NORVASC) 5 MG tablet Take one tablet daily  30 tablet  11  . doxazosin (CARDURA) 4 MG tablet 1 tablet daily  30 tablet  11  . losartan-hydrochlorothiazide (HYZAAR) 50-12.5 MG per tablet Take 1 tablet by mouth daily.  90 tablet  3  . metoprolol succinate (TOPROL-XL) 100 MG 24 hr tablet 1 tablet daily  30 tablet  11  . augmented betamethasone dipropionate (DIPROLENE AF) 0.05 % cream Apply topically 2 (two) times daily.  30 g  0   No current facility-administered medications for this visit.    ROS as above otherwise neg.   Physical Exam:  BP 146/94  Pulse 74  Temp(Src) 97.8 F (36.6 C) (Oral)  Resp 18  Ht 5\' 11"  (1.803 m)  Wt 343 lb 3.2 oz (155.674 kg)  BMI 47.89 kg/m2  SpO2 99% Gen:  Alert, cooperative patient who appears stated age in no acute distress.  Vital signs  reviewed. MSK:   Back:  Normal skin, Spine with normal alignment and no deformity.  No tenderness to vertebral process palpation.  Paraspinous muscles are not tender and with only minimal lumbar spasm BL.   Range of motion is full at neck and only minimally decreased forward flexion of lumbar sacral regions.  Straight leg raise is negative BL. Neuro:  Sensation and motor function 5/5 bilateral lower extremities.  Patellar and Achilles  DTR's +2 patellar BL.  He is able to walk on his heels and toes without difficulty.  Knees:  Left knee with surgical scars and post posterior drawer test.   Right knee:  No effusion or redness.  Negative post/ant drawer.  No joint laxity noted.  No medial or lateral joint tenderness.  McMurray's negative.  Lachmann's negative   [facility] reading (PRIMARY) by  Dr. Mingo Amber:  Some mild bone spurring noted.  No fracture.  No joint space narrowing..   Assessment and Plan:  1.  Lumbago: - likely arthritis/wear and tear - best thing for him is weight loss and activity, discussed this and he knows this.    2.  Right knee pain: - likely arthritis.  - discussed cortisone shot but he declines - continue anti-inflammatories.  Tramadol will help with pain.   - encouraged weight loss.

## 2013-07-25 NOTE — Patient Instructions (Signed)
You have some arthritis (wear and tear) of your back as well as some very mild bone spurring.  This is likely what's causing your pain.  Weight loss and stretching are the best things for this.   Tramadol for back and knee.  Sounds mostly like inflammation and possible arthritis in your knee as well.   It was good to see you today.

## 2014-02-12 ENCOUNTER — Other Ambulatory Visit: Payer: Self-pay

## 2014-02-23 ENCOUNTER — Other Ambulatory Visit: Payer: Self-pay | Admitting: Emergency Medicine

## 2014-03-13 ENCOUNTER — Other Ambulatory Visit: Payer: Self-pay | Admitting: Emergency Medicine

## 2014-03-20 ENCOUNTER — Ambulatory Visit (INDEPENDENT_AMBULATORY_CARE_PROVIDER_SITE_OTHER): Payer: BC Managed Care – PPO | Admitting: Emergency Medicine

## 2014-03-20 VITALS — BP 138/84 | HR 106 | Temp 99.3°F | Resp 18 | Ht 72.5 in | Wt 337.8 lb

## 2014-03-20 DIAGNOSIS — Z131 Encounter for screening for diabetes mellitus: Secondary | ICD-10-CM

## 2014-03-20 DIAGNOSIS — E785 Hyperlipidemia, unspecified: Secondary | ICD-10-CM

## 2014-03-20 DIAGNOSIS — Z125 Encounter for screening for malignant neoplasm of prostate: Secondary | ICD-10-CM

## 2014-03-20 DIAGNOSIS — R21 Rash and other nonspecific skin eruption: Secondary | ICD-10-CM

## 2014-03-20 DIAGNOSIS — Z23 Encounter for immunization: Secondary | ICD-10-CM

## 2014-03-20 DIAGNOSIS — I10 Essential (primary) hypertension: Secondary | ICD-10-CM

## 2014-03-20 LAB — COMPLETE METABOLIC PANEL WITH GFR
ALBUMIN: 4 g/dL (ref 3.5–5.2)
ALK PHOS: 55 U/L (ref 39–117)
ALT: 27 U/L (ref 0–53)
AST: 22 U/L (ref 0–37)
BILIRUBIN TOTAL: 0.5 mg/dL (ref 0.2–1.2)
BUN: 15 mg/dL (ref 6–23)
CO2: 22 mEq/L (ref 19–32)
Calcium: 9.3 mg/dL (ref 8.4–10.5)
Chloride: 103 mEq/L (ref 96–112)
Creat: 0.91 mg/dL (ref 0.50–1.35)
GFR, Est African American: >89 mL/min
GFR, Est Non African American: >89 mL/min
Glucose, Bld: 120 mg/dL — ABNORMAL HIGH (ref 70–99)
POTASSIUM: 3.9 meq/L (ref 3.5–5.3)
SODIUM: 137 meq/L (ref 135–145)
TOTAL PROTEIN: 7.3 g/dL (ref 6.0–8.3)

## 2014-03-20 LAB — POCT CBC
GRANULOCYTE PERCENT: 69.4 % (ref 37–80)
HCT, POC: 43.4 % — AB (ref 43.5–53.7)
HEMOGLOBIN: 14.3 g/dL (ref 14.1–18.1)
Lymph, poc: 1.8 (ref 0.6–3.4)
MCH: 27.9 pg (ref 27–31.2)
MCHC: 32.9 g/dL (ref 31.8–35.4)
MCV: 85 fL (ref 80–97)
MID (CBC): 0.2 (ref 0–0.9)
MPV: 7.5 fL (ref 0–99.8)
PLATELET COUNT, POC: 192 10*3/uL (ref 142–424)
POC Granulocyte: 4.4 (ref 2–6.9)
POC LYMPH PERCENT: 27.6 %L (ref 10–50)
POC MID %: 3 % (ref 0–12)
RBC: 5.11 M/uL (ref 4.69–6.13)
RDW, POC: 14.7 %
WBC: 6.4 10*3/uL (ref 4.6–10.2)

## 2014-03-20 LAB — LIPID PANEL
CHOL/HDL RATIO: 4.8 ratio
CHOLESTEROL: 160 mg/dL (ref 0–200)
HDL: 33 mg/dL — AB (ref >39)
LDL Cholesterol: 117 mg/dL — ABNORMAL HIGH (ref 0–99)
Triglycerides: 52 mg/dL (ref <150)
VLDL: 10 mg/dL (ref 0–40)

## 2014-03-20 LAB — GLUCOSE, POCT (MANUAL RESULT ENTRY): POC GLUCOSE: 121 mg/dL — AB (ref 70–99)

## 2014-03-20 LAB — POCT GLYCOSYLATED HEMOGLOBIN (HGB A1C): HEMOGLOBIN A1C: 5.9

## 2014-03-20 MED ORDER — AMLODIPINE BESYLATE 5 MG PO TABS: 5.0000 mg | ORAL_TABLET | Freq: Every day | ORAL | Status: DC

## 2014-03-20 MED ORDER — BETAMETHASONE DIPROPIONATE AUG 0.05 % EX CREA: TOPICAL_CREAM | Freq: Two times a day (BID) | CUTANEOUS | Status: DC

## 2014-03-20 MED ORDER — DOXAZOSIN MESYLATE 4 MG PO TABS: 4.0000 mg | ORAL_TABLET | Freq: Every day | ORAL | Status: DC

## 2014-03-20 MED ORDER — METOPROLOL SUCCINATE ER 100 MG PO TB24: 100.0000 mg | ORAL_TABLET | Freq: Every day | ORAL | Status: DC

## 2014-03-20 MED ORDER — LOSARTAN POTASSIUM-HCTZ 50-12.5 MG PO TABS: 1.0000 | ORAL_TABLET | Freq: Every day | ORAL | Status: DC

## 2014-03-20 NOTE — Patient Instructions (Signed)
Influenza Vaccine (Flu Vaccine, Inactivated or Recombinant) 2014-2015: What You Need to Know 1. Why get vaccinated? Influenza ("flu") is a contagious disease that spreads around the [place] every winter, usually between October and May. Flu is caused by influenza viruses, and is spread mainly by coughing, sneezing, and close contact. Anyone can get flu, but the risk of getting flu is highest among children. Symptoms come on suddenly and may last several days. They can include:  fever/chills  sore throat  muscle aches  fatigue  cough  headache  runny or stuffy nose Flu can make some people much sicker than others. These people include young children, people 65 and older, pregnant women, and people with certain health conditions-such as heart, lung or kidney disease, nervous system disorders, or a weakened immune system. Flu vaccination is especially important for these people, and anyone in close contact with them. Flu can also lead to pneumonia, and make existing medical conditions worse. It can cause diarrhea and seizures in children. Each year thousands of people in the [place] die from flu, and many more are hospitalized. Flu vaccine is the best protection against flu and its complications. Flu vaccine also helps prevent spreading flu from person to person. 2. Inactivated and recombinant flu vaccines You are getting an injectable flu vaccine, which is either an "inactivated" or "recombinant" vaccine. These vaccines do not contain any live influenza virus. They are given by injection with a needle, and often called the "flu shot."  A different live, attenuated (weakened) influenza vaccine is sprayed into the nostrils. This vaccine is described in a separate Vaccine Information Statement. Flu vaccination is recommended every year. Some children 6 months through 8 years of age might need two doses during one year. Flu viruses are always changing. Each year's flu vaccine is made  to protect against 3 or 4 viruses that are likely to cause disease that year. Flu vaccine cannot prevent all cases of flu, but it is the best defense against the disease.  It takes about 2 weeks for protection to develop after the vaccination, and protection lasts several months to a year. Some illnesses that are not caused by influenza virus are often mistaken for flu. Flu vaccine will not prevent these illnesses. It can only prevent influenza. Some inactivated flu vaccine contains a very small amount of a mercury-based preservative called thimerosal. Studies have shown that thimerosal in vaccines is not harmful, but flu vaccines that do not contain a preservative are available. 3. Some people should not get this vaccine Tell the person who gives you the vaccine:  If you have any severe, life-threatening allergies. If you ever had a life-threatening allergic reaction after a dose of flu vaccine, or have a severe allergy to any part of this vaccine, including (for example) an allergy to gelatin, antibiotics, or eggs, you may be advised not to get vaccinated. Most, but not all, types of flu vaccine contain a small amount of egg protein.  If you ever had Guillain-Barr Syndrome (a severe paralyzing illness, also called GBS). Some people with a history of GBS should not get this vaccine. This should be discussed with your doctor.  If you are not feeling well. It is usually okay to get flu vaccine when you have a mild illness, but you might be advised to wait until you feel better. You should come back when you are better. 4. Risks of a vaccine reaction With a vaccine, like any medicine, there is a chance of side   effects. These are usually mild and go away on their own. Problems that could happen after any vaccine:  Brief fainting spells can happen after any medical procedure, including vaccination. Sitting or lying down for about 15 minutes can help prevent fainting, and injuries caused by a fall. Tell  your doctor if you feel dizzy, or have vision changes or ringing in the ears.  Severe shoulder pain and reduced range of motion in the arm where a shot was given can happen, very rarely, after a vaccination.  Severe allergic reactions from a vaccine are very rare, estimated at less than 1 in a million doses. If one were to occur, it would usually be within a few minutes to a few hours after the vaccination. Mild problems following inactivated flu vaccine:  soreness, redness, or swelling where the shot was given  hoarseness  sore, red or itchy eyes  cough  fever  aches  headache  itching  fatigue If these problems occur, they usually begin soon after the shot and last 1 or 2 days. Moderate problems following inactivated flu vaccine:  Young children who get inactivated flu vaccine and pneumococcal vaccine (PCV13) at the same time may be at increased risk for seizures caused by fever. Ask your doctor for more information. Tell your doctor if a child who is getting flu vaccine has ever had a seizure. Inactivated flu vaccine does not contain live flu virus, so you cannot get the flu from this vaccine. As with any medicine, there is a very remote chance of a vaccine causing a serious injury or death. The safety of vaccines is always being monitored. For more information, visit: www.cdc.gov/vaccinesafety/ 5. What if there is a serious reaction? What should I look for?  Look for anything that concerns you, such as signs of a severe allergic reaction, very high fever, or behavior changes. Signs of a severe allergic reaction can include hives, swelling of the face and throat, difficulty breathing, a fast heartbeat, dizziness, and weakness. These would start a few minutes to a few hours after the vaccination. What should I do?  If you think it is a severe allergic reaction or other emergency that can't wait, call 9-1-1 and get the person to the nearest hospital. Otherwise, call your  doctor.  Afterward, the reaction should be reported to the Vaccine Adverse Event Reporting System (VAERS). Your doctor should file this report, or you can do it yourself through the VAERS web site at www.vaers.hhs.gov, or by calling 1-800-822-7967. VAERS does not give medical advice. 6. The National Vaccine Injury Compensation Program The National Vaccine Injury Compensation Program (VICP) is a federal program that was created to compensate people who may have been injured by certain vaccines. Persons who believe they may have been injured by a vaccine can learn about the program and about filing a claim by calling 1-800-338-2382 or visiting the VICP website at www.hrsa.gov/vaccinecompensation. There is a time limit to file a claim for compensation. 7. How can I learn more?  Ask your health care provider.  Call your local or state health department.  Contact the Centers for Disease Control and Prevention (CDC):  Call 1-800-232-4636 (1-800-CDC-INFO) or  Visit CDC's website at www.cdc.gov/flu CDC Vaccine Information Statement (Interim) Inactivated Influenza Vaccine (12/16/2012) Document Released: 02/08/2006 Document Revised: 08/31/2013 Document Reviewed: 04/03/2013 [employer] Patient Information 2015 [employer]. This information is not intended to replace advice given to you by your health care provider. Make sure you discuss any questions you have with your health   care provider.  

## 2014-03-20 NOTE — Progress Notes (Addendum)
Subjective:    Patient ID: Zachary [name], male    DOB: 03-Feb-1956, 58 y.o.   MRN: [id number] This chart was scribed for Zachary Queen, MD by Cathie Hoops, ED Scribe. The patient was seen in Room 4. The patient's care was started at 8:42 AM.   03/20/2014  Medication Refill and Request for lab   HPI HPI Comments: Zachary [name] is a 58 y.o. male who presents to the [facility] complaining of chronic, moderate and gradually worsening back pain onset March 2015. Pt notes his pain is worsened with ambulation and standing straight up. He denies pain while sitting or lying down. 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 Pt was seen by Dr. Annabell Sabal on 3/28 for back pain and received x-rays at this time with normal findings.  Pt states his weight is unchanged, although he attempts to eat healthy foods but denies regular exercise. Pt's father had Type II Diabetes in his 5s. Pt denies having a family history of prostate cancer. He had a normal colonoscopy in 2009 and was told to repeat in 10 years. He would like his PSA checked at this time. Pt denies chest pain, SOB, or pain radiating his legs.  Review of Systems  Constitutional: Negative for fever and chills.  Respiratory: Negative for shortness of breath.   Cardiovascular: Negative for chest pain.  Gastrointestinal: Negative for nausea and vomiting.  Musculoskeletal: Positive for back pain.    Past Medical History  Diagnosis Date  . Asthma   . Hypertension   . Allergy   . Arthritis    Past Surgical History  Procedure Laterality Date  . Appendectomy     Allergies  Allergen Reactions  . Penicillins Rash   Current Outpatient Prescriptions  Medication Sig Dispense Refill  . amLODipine (NORVASC) 5 MG tablet Take 1 tablet (5 mg total) by mouth daily. PATIENT NEEDS OFFICE VISIT FOR ADDITIONAL REFILLS 30 tablet 0  . augmented betamethasone dipropionate (DIPROLENE AF) 0.05 % cream Apply topically  2 (two) times daily. 30 g 0  . doxazosin (CARDURA) 4 MG tablet Take 1 tablet (4 mg total) by mouth daily. PATIENT NEEDS OFFICE VISIT FOR ADDITIONAL REFILLS 30 tablet 0  . losartan-hydrochlorothiazide (HYZAAR) 50-12.5 MG per tablet Take 1 tablet by mouth daily. PATIENT NEEDS OFFICE VISIT FOR ADDITIONAL REFILLS 30 tablet 0  . metoprolol succinate (TOPROL-XL) 100 MG 24 hr tablet Take 1 tablet (100 mg total) by mouth daily. PATIENT NEEDS OFFICE VISIT FOR ADDITIONAL REFILLS 30 tablet 0  . traMADol (ULTRAM) 50 MG tablet Take 1 tablet (50 mg total) by mouth every 8 (eight) hours as needed. 30 tablet 0   No current facility-administered medications for this visit.       Objective:    BP 138/84 mmHg  Pulse 106  Temp(Src) 99.3 F (37.4 C) (Oral)  Resp 18  Ht 6' 0.5" (1.842 m)  Wt 337 lb 12.8 oz (153.225 kg)  BMI 45.16 kg/m2  SpO2 97% Physical Exam  Constitutional: He is oriented to person, place, and time. He appears well-developed and well-nourished. No distress.  HENT:  Head: Normocephalic and atraumatic.  Eyes: Conjunctivae and EOM are normal.  Neck: Neck supple. No tracheal deviation present.  Cardiovascular: Normal rate, regular rhythm and normal heart sounds.  Exam reveals no gallop and no friction rub.   No murmur heard. Pulmonary/Chest: Effort normal and breath sounds normal. No respiratory distress.  Abdominal: Soft. There  is no tenderness.  Genitourinary:  Prostate is normal sized without nodularity or tenderness.  Musculoskeletal: Normal range of motion.  Neurological: He is alert and oriented to person, place, and time.  Skin: Skin is warm and dry.  Psychiatric: He has a normal mood and affect. His behavior is normal.  Nursing note and vitals reviewed.  Lab Results  Component Value Date   CHOL 170 02/21/2013   HDL 38* 02/21/2013   LDLCALC 122* 02/21/2013   TRIG 50 02/21/2013   CHOLHDL 4.5 02/21/2013   Results for orders placed or performed in visit on 03/20/14  POCT  CBC  Result Value Ref Range   WBC 6.4 4.6 - 10.2 K/uL   Lymph, poc 1.8 0.6 - 3.4   POC LYMPH PERCENT 27.6 10 - 50 %L   MID (cbc) 0.2 0 - 0.9   POC MID % 3.0 0 - 12 %M   POC Granulocyte 4.4 2 - 6.9   Granulocyte percent 69.4 37 - 80 %G   RBC 5.11 4.69 - 6.13 M/uL   Hemoglobin 14.3 14.1 - 18.1 g/dL   HCT, POC 43.4 (A) 43.5 - 53.7 %   MCV 85.0 80 - 97 fL   MCH, POC 27.9 27 - 31.2 pg   MCHC 32.9 31.8 - 35.4 g/dL   RDW, POC 14.7 %   Platelet Count, POC 192 142 - 424 K/uL   MPV 7.5 0 - 99.8 fL  POCT glucose (manual entry)  Result Value Ref Range   POC Glucose 121 (A) 70 - 99 mg/dl  POCT glycosylated hemoglobin (Hb A1C)  Result Value Ref Range   Hemoglobin A1C 5.9     Assessment & Plan:  8:52 AM- Patient informed of current plan for treatment and evaluation and agrees with plan at this time. Patient is borderline diabetic with a glucose of 121 and A1c of 5.9. He needs to work on exercise and weight loss. PSA was done and pending. He is up-to-date on his colonoscopy. Flu shot to be given.I personally performed the services described in this documentation, which was scribed in my presence. The recorded information has been reviewed and is accurate. I personally performed the services described in this documentation, which was scribed in my presence. The recorded information has been reviewed and is accurate.

## 2014-03-23 LAB — PSA: PSA: 1.01 ng/mL (ref <=4.00)

## 2015-04-08 ENCOUNTER — Ambulatory Visit (INDEPENDENT_AMBULATORY_CARE_PROVIDER_SITE_OTHER): Payer: 59 | Admitting: Family Medicine

## 2015-04-08 VITALS — BP 138/80 | HR 103 | Temp 98.6°F | Resp 18 | Ht 72.0 in | Wt 355.0 lb

## 2015-04-08 DIAGNOSIS — E669 Obesity, unspecified: Secondary | ICD-10-CM

## 2015-04-08 DIAGNOSIS — R21 Rash and other nonspecific skin eruption: Secondary | ICD-10-CM | POA: Diagnosis not present

## 2015-04-08 DIAGNOSIS — E8881 Metabolic syndrome: Secondary | ICD-10-CM

## 2015-04-08 DIAGNOSIS — Z125 Encounter for screening for malignant neoplasm of prostate: Secondary | ICD-10-CM

## 2015-04-08 DIAGNOSIS — I10 Essential (primary) hypertension: Secondary | ICD-10-CM

## 2015-04-08 DIAGNOSIS — R7303 Prediabetes: Secondary | ICD-10-CM

## 2015-04-08 LAB — COMPREHENSIVE METABOLIC PANEL
ALK PHOS: 53 U/L (ref 40–115)
ALT: 27 U/L (ref 9–46)
AST: 24 U/L (ref 10–35)
Albumin: 4.1 g/dL (ref 3.6–5.1)
BUN: 17 mg/dL (ref 7–25)
CO2: 26 mmol/L (ref 20–31)
CREATININE: 1.04 mg/dL (ref 0.70–1.33)
Calcium: 9.3 mg/dL (ref 8.6–10.3)
Chloride: 100 mmol/L (ref 98–110)
Glucose, Bld: 126 mg/dL — ABNORMAL HIGH (ref 65–99)
Potassium: 4.2 mmol/L (ref 3.5–5.3)
SODIUM: 138 mmol/L (ref 135–146)
Total Bilirubin: 0.6 mg/dL (ref 0.2–1.2)
Total Protein: 7.7 g/dL (ref 6.1–8.1)

## 2015-04-08 LAB — HEMOGLOBIN A1C
HEMOGLOBIN A1C: 6.3 % — AB (ref <5.7)
MEAN PLASMA GLUCOSE: 134 mg/dL — AB (ref <117)

## 2015-04-08 LAB — LIPID PANEL
Cholesterol: 164 mg/dL (ref 125–200)
HDL: 42 mg/dL (ref >=40)
LDL Cholesterol: 107 mg/dL (ref <130)
Total CHOL/HDL Ratio: 3.9 Ratio (ref <=5.0)
Triglycerides: 76 mg/dL (ref <150)
VLDL: 15 mg/dL (ref <30)

## 2015-04-08 MED ORDER — BETAMETHASONE DIPROPIONATE AUG 0.05 % EX CREA: TOPICAL_CREAM | Freq: Two times a day (BID) | CUTANEOUS | Status: DC

## 2015-04-08 MED ORDER — METOPROLOL SUCCINATE ER 100 MG PO TB24: 100.0000 mg | ORAL_TABLET | Freq: Every day | ORAL | Status: DC

## 2015-04-08 MED ORDER — AMLODIPINE BESYLATE 10 MG PO TABS: 10.0000 mg | ORAL_TABLET | Freq: Every day | ORAL | Status: DC

## 2015-04-08 MED ORDER — LOSARTAN POTASSIUM-HCTZ 50-12.5 MG PO TABS: 1.0000 | ORAL_TABLET | Freq: Every day | ORAL | Status: DC

## 2015-04-08 MED ORDER — DOXAZOSIN MESYLATE 4 MG PO TABS: 4.0000 mg | ORAL_TABLET | Freq: Every day | ORAL | Status: DC

## 2015-04-08 NOTE — Progress Notes (Signed)
Subjective:  This chart was scribed for Delman Cheadle, MD by Moises Blood, Medical Scribe. This patient was seen in Room 12 and the patient's care was started 8:28 AM.   Patient ID: Zachary [name], male    DOB: 1955-09-29, 59 y.o.   MRN: [id number] Chief Complaint  Patient presents with  . Medication Refill    amlodipine, doxazosin, losartan, and metoprolol  . Other    Lab work  . Flu Vaccine   HPI Zachary [name] is a 59 y.o. male who presents to [facility] for medication refill.  Medications He has about 2 more days worth of medication left. He has swelling in his legs but it's consistent with end of the day, and would resolve after elevating his legs at night. He noticed some wheezing but believes it's from his asthma. He notes having nocturia 1-2 a night. He denies any complications with his medications.   Lab Work He also wants lab work done, including PSA, and cholesterol.  He checks his sugar outside the office periodically.  His BP is a little high today, but he states that the upper number is always around 140.   Immunizations He also requested to have a flu shot.   Past Medical History  Diagnosis Date  . Asthma   . Hypertension   . Allergy   . Arthritis    Prior to Admission medications   Medication Sig Start Date End Date Taking? Authorizing Provider  amLODipine (NORVASC) 5 MG tablet Take 1 tablet (5 mg total) by mouth daily. PATIENT NEEDS OFFICE VISIT FOR ADDITIONAL REFILLS 03/20/14  Yes Darlyne Russian, MD  augmented betamethasone dipropionate (DIPROLENE AF) 0.05 % cream Apply topically 2 (two) times daily. 03/20/14  Yes Darlyne Russian, MD  doxazosin (CARDURA) 4 MG tablet Take 1 tablet (4 mg total) by mouth daily. PATIENT NEEDS OFFICE VISIT FOR ADDITIONAL REFILLS 03/20/14  Yes Darlyne Russian, MD  losartan-hydrochlorothiazide (HYZAAR) 50-12.5 MG per tablet Take 1 tablet by mouth daily. PATIENT NEEDS OFFICE VISIT FOR ADDITIONAL REFILLS 03/20/14  Yes Darlyne Russian, MD    metoprolol succinate (TOPROL-XL) 100 MG 24 hr tablet Take 1 tablet (100 mg total) by mouth daily. PATIENT NEEDS OFFICE VISIT FOR ADDITIONAL REFILLS 03/20/14  Yes Darlyne Russian, MD  traMADol (ULTRAM) 50 MG tablet Take 1 tablet (50 mg total) by mouth every 8 (eight) hours as needed. Patient not taking: Reported on 04/08/2015 07/25/13   Alveda Reasons, MD   Allergies  Allergen Reactions  . Penicillins Rash    Review of Systems  Constitutional: Negative for fever, chills and fatigue.  Respiratory: Negative for cough, shortness of breath and wheezing.   Cardiovascular: Negative for leg swelling.  Gastrointestinal: Negative for nausea, vomiting, diarrhea and constipation.  Neurological: Negative for dizziness, weakness and light-headedness.       Objective:   Physical Exam  Constitutional: He is oriented to person, place, and time. He appears well-developed and well-nourished. No distress.  HENT:  Head: Normocephalic and atraumatic.  Eyes: EOM are normal. Pupils are equal, round, and reactive to light.  Neck: Neck supple.  Cardiovascular: Regular rhythm.  Tachycardia present.   Murmur (questionable 2/6 ejection murmur) heard. Pulmonary/Chest: Effort normal and breath sounds normal. No respiratory distress. He has no wheezes.  Musculoskeletal: Normal range of motion. He exhibits edema (2+ pedal edema on left, trace on right).  Neurological: He is alert and oriented to person, place, and time.  Skin: Skin is warm and dry.  Psychiatric: He has a normal mood and affect. His behavior is normal.  Nursing note and vitals reviewed.   BP 148/78 mmHg  Pulse 99  Temp(Src) 98.6 F (37 C)  Resp 18  Ht 6' (1.829 m)  Wt 355 lb (161.027 kg)  BMI 48.14 kg/m2  SpO2 96%     Assessment & Plan:   1. Essential hypertension - increase amlodipine from 5 to 10, refilled losartan-hctz - could double if needed - same with cardura, cont toprol 100. Start checking bp outside office and call if elev so  we can increase med.  2. Screening for prostate cancer   3. Prediabetes - a1c increased from 5.9 -> 6.3  4. OBESITY - encouraged finding ways to increase exercises despite various periotic muscle strains/arthralgias. High risk of sleep apnea but currently asymptomatic.  5. Metabolic syndrome   6. Rash and nonspecific skin eruption - refilled top steroids  Encouraged pt to make appt for CPE within 6 mos - none prior in Epic. Uses [facility] for PCP.  Orders Placed This Encounter  Procedures  . Comprehensive metabolic panel    Order Specific Question:  Has the patient fasted?    Answer:  Yes  . Lipid panel    Order Specific Question:  Has the patient fasted?    Answer:  Yes  . PSA  . Hemoglobin A1c    Meds ordered this encounter  Medications  . doxazosin (CARDURA) 4 MG tablet    Sig: Take 1 tablet (4 mg total) by mouth daily. PATIENT NEEDS OFFICE VISIT FOR ADDITIONAL REFILLS    Dispense:  30 tablet    Refill:  11  . losartan-hydrochlorothiazide (HYZAAR) 50-12.5 MG tablet    Sig: Take 1 tablet by mouth daily. PATIENT NEEDS OFFICE VISIT FOR ADDITIONAL REFILLS    Dispense:  30 tablet    Refill:  11  . metoprolol succinate (TOPROL-XL) 100 MG 24 hr tablet    Sig: Take 1 tablet (100 mg total) by mouth daily. PATIENT NEEDS OFFICE VISIT FOR ADDITIONAL REFILLS    Dispense:  30 tablet    Refill:  11  . amLODipine (NORVASC) 10 MG tablet    Sig: Take 1 tablet (10 mg total) by mouth daily. PATIENT NEEDS OFFICE VISIT FOR ADDITIONAL REFILLS    Dispense:  30 tablet    Refill:  11  . augmented betamethasone dipropionate (DIPROLENE AF) 0.05 % cream    Sig: Apply topically 2 (two) times daily.    Dispense:  30 g    Refill:  11    I personally performed the services described in this documentation, which was scribed in my presence. The recorded information has been reviewed and considered, and addended by me as needed.  Delman Cheadle, MD MPH   By signing my name below, I, Moises Blood, attest that  this documentation has been prepared under the direction and in the presence of Delman Cheadle, MD. Electronically Signed: Moises Blood, [place]. 04/08/2015 , 8:28 AM .  Results for orders placed or performed in visit on 04/08/15  Comprehensive metabolic panel  Result Value Ref Range   Sodium 138 135 - 146 mmol/L   Potassium 4.2 3.5 - 5.3 mmol/L   Chloride 100 98 - 110 mmol/L   CO2 26 20 - 31 mmol/L   Glucose, Bld 126 (H) 65 - 99 mg/dL   BUN 17 7 - 25 mg/dL   Creat 1.04 0.70 - 1.33 mg/dL   Total Bilirubin 0.6 0.2 - 1.2 mg/dL  Alkaline Phosphatase 53 40 - 115 U/L   AST 24 10 - 35 U/L   ALT 27 9 - 46 U/L   Total Protein 7.7 6.1 - 8.1 g/dL   Albumin 4.1 3.6 - 5.1 g/dL   Calcium 9.3 8.6 - 10.3 mg/dL  Lipid panel  Result Value Ref Range   Cholesterol 164 125 - 200 mg/dL   Triglycerides 76 <150 mg/dL   HDL 42 >=40 mg/dL   Total CHOL/HDL Ratio 3.9 <=5.0 Ratio   VLDL 15 <30 mg/dL   LDL Cholesterol 107 <130 mg/dL  PSA  Result Value Ref Range   PSA  <=4.00 ng/mL  Hemoglobin A1c  Result Value Ref Range   Hgb A1c MFr Bld 6.3 (H) <5.7 %   Mean Plasma Glucose 134 (H) <117 mg/dL

## 2015-04-08 NOTE — Patient Instructions (Addendum)
You are currently on 5 different blood pressure medicines - 1 of them has 2 different pills in one.  We will double your amlodipine but please start checking your blood pressure at home and call if elevated so we can increase so of the others to get you under better control.  If we can't, it is time to start looking for abnormalities that could be causing the high blood pressure (secondary causes such as sleep apnea). Remember to eliminate salt as much as possible, drink plenty of water, get some exercise daily - even just walking for 45-60 minutes, and no alcohol.  Managing Your High Blood Pressure Blood pressure is a measurement of how forceful your blood is pressing against the walls of the arteries. Arteries are muscular tubes within the circulatory system. Blood pressure does not stay the same. Blood pressure rises when you are active, excited, or nervous; and it lowers during sleep and relaxation. If the numbers measuring your blood pressure stay above normal most of the time, you are at risk for health problems. High blood pressure (hypertension) is a long-term (chronic) condition in which blood pressure is elevated. A blood pressure reading is recorded as two numbers, such as 120 over 80 (or 120/80). The first, higher number is called the systolic pressure. It is a measure of the pressure in your arteries as the heart beats. The second, lower number is called the diastolic pressure. It is a measure of the pressure in your arteries as the heart relaxes between beats.  Keeping your blood pressure in a normal range is important to your overall health and prevention of health problems, such as heart disease and stroke. When your blood pressure is uncontrolled, your heart has to work harder than normal. High blood pressure is a very common condition in adults because blood pressure tends to rise with age. Men and women are equally likely to have hypertension but at different times in life. Before age 22, men  are more likely to have hypertension. After 58 years of age, women are more likely to have it. Hypertension is especially common in African Americans. This condition often has no signs or symptoms. The cause of the condition is usually not known. Your caregiver can help you come up with a plan to keep your blood pressure in a normal, healthy range. BLOOD PRESSURE STAGES Blood pressure is classified into four stages: normal, prehypertension, stage 1, and stage 2. Your blood pressure reading will be used to determine what type of treatment, if any, is necessary. Appropriate treatment options are tied to these four stages:  Normal  Systolic pressure (mm Hg): below 120.  Diastolic pressure (mm Hg): below 80. Prehypertension  Systolic pressure (mm Hg): 120 to 139.  Diastolic pressure (mm Hg): 80 to 89. Stage1  Systolic pressure (mm Hg): 140 to 159.  Diastolic pressure (mm Hg): 90 to 99. Stage2  Systolic pressure (mm Hg): 160 or above.  Diastolic pressure (mm Hg): 100 or above. RISKS RELATED TO HIGH BLOOD PRESSURE Managing your blood pressure is an important responsibility. Uncontrolled high blood pressure can lead to:  A heart attack.  A stroke.  A weakened blood vessel (aneurysm).  Heart failure.  Kidney damage.  Eye damage.  Metabolic syndrome.  Memory and concentration problems. HOW TO MANAGE YOUR BLOOD PRESSURE Blood pressure can be managed effectively with lifestyle changes and medicines (if needed). Your caregiver will help you come up with a plan to bring your blood pressure within a normal range. Your  plan should include the following: Education  Read all information provided by your caregivers about how to control blood pressure.  Educate yourself on the latest guidelines and treatment recommendations. New research is always being done to further define the risks and treatments for high blood pressure. Lifestylechanges  Control your weight.  Avoid  smoking.  Stay physically active.  Reduce the amount of salt in your diet.  Reduce stress.  Control any chronic conditions, such as high cholesterol or diabetes.  Reduce your alcohol intake. Medicines  Several medicines (antihypertensive medicines) are available, if needed, to bring blood pressure within a normal range. Communication  Review all the medicines you take with your caregiver because there may be side effects or interactions.  Talk with your caregiver about your diet, exercise habits, and other lifestyle factors that may be contributing to high blood pressure.  See your caregiver regularly. Your caregiver can help you create and adjust your plan for managing high blood pressure. RECOMMENDATIONS FOR TREATMENT AND FOLLOW-UP  The following recommendations are based on current guidelines for managing high blood pressure in nonpregnant adults. Use these recommendations to identify the proper follow-up period or treatment option based on your blood pressure reading. You can discuss these options with your caregiver.  Systolic pressure of 123456 to [id number] or diastolic pressure of 80 to 89: Follow up with your caregiver as directed.  Systolic pressure of [id number] to 0000000 or diastolic pressure of 90 to 100: Follow up with your caregiver within 2 months.  Systolic pressure above 0000000 or diastolic pressure above [id number]: Follow up with your caregiver within 1 month.  Systolic pressure above [id number] or diastolic pressure above [id number]: Consider antihypertensive therapy; follow up with your caregiver within 1 week.  Systolic pressure above [id number] or diastolic pressure above 123456: Begin antihypertensive therapy; follow up with your caregiver within 1 week.   This information is not intended to replace advice given to you by your health care provider. Make sure you discuss any questions you have with your health care provider.   Document Released: 01/09/2012 Document Reviewed: 01/09/2012 Elsevier Interactive  Patient Education 2016 [place] DASH stands for "Dietary Approaches to Stop Hypertension." The DASH eating plan is a healthy eating plan that has been shown to reduce high blood pressure (hypertension). Additional health benefits may include reducing the risk of type 2 diabetes mellitus, heart disease, and stroke. The DASH eating plan may also help with weight loss. WHAT DO I NEED TO KNOW ABOUT THE DASH EATING PLAN? For the DASH eating plan, you will follow these general guidelines:  Choose foods with a percent daily value for sodium of less than 5% (as listed on the food label).  Use salt-free seasonings or herbs instead of table salt or sea salt.  Check with your health care provider or pharmacist before using salt substitutes.  Eat lower-sodium products, often labeled as "lower sodium" or "no salt added."  Eat fresh foods.  Eat more vegetables, fruits, and low-fat dairy products.  Choose whole grains. Look for the word "whole" as the first word in the ingredient list.  Choose fish and skinless chicken or [place] more often than red meat. Limit fish, poultry, and meat to 6 oz (170 g) each day.  Limit sweets, desserts, sugars, and sugary drinks.  Choose heart-healthy fats.  Limit cheese to 1 oz (28 g) per day.  Eat more home-cooked food and less restaurant, buffet, and fast food.  Limit fried foods.  Cook foods  using methods other than frying.  Limit canned vegetables. If you do use them, rinse them well to decrease the sodium.  When eating at a restaurant, ask that your food be prepared with less salt, or no salt if possible. WHAT FOODS CAN I EAT? Seek help from a dietitian for individual calorie needs. Grains Whole grain or whole wheat bread. Brown rice. Whole grain or whole wheat pasta. Quinoa, bulgur, and whole grain cereals. Low-sodium cereals. Corn or whole wheat flour tortillas. Whole grain cornbread. Whole grain crackers. Low-sodium  crackers. Vegetables Fresh or frozen vegetables (raw, steamed, roasted, or grilled). Low-sodium or reduced-sodium tomato and vegetable juices. Low-sodium or reduced-sodium tomato sauce and paste. Low-sodium or reduced-sodium canned vegetables.  Fruits All fresh, canned (in natural juice), or frozen fruits. Meat and Other Protein Products Ground beef (85% or leaner), grass-fed beef, or beef trimmed of fat. Skinless chicken or [place]. Ground chicken or [place]. Pork trimmed of fat. All fish and seafood. Eggs. Dried beans, peas, or lentils. Unsalted nuts and seeds. Unsalted canned beans. Dairy Low-fat dairy products, such as skim or 1% milk, 2% or reduced-fat cheeses, low-fat ricotta or cottage cheese, or plain low-fat yogurt. Low-sodium or reduced-sodium cheeses. Fats and Oils Tub margarines without trans fats. Light or reduced-fat mayonnaise and salad dressings (reduced sodium). Avocado. Safflower, olive, or canola oils. Natural peanut or almond butter. Other Unsalted popcorn and pretzels. The items listed above may not be a complete list of recommended foods or beverages. Contact your dietitian for more options. WHAT FOODS ARE NOT RECOMMENDED? Grains White bread. White pasta. White rice. Refined cornbread. Bagels and croissants. Crackers that contain trans fat. Vegetables Creamed or fried vegetables. Vegetables in a cheese sauce. Regular canned vegetables. Regular canned tomato sauce and paste. Regular tomato and vegetable juices. Fruits Dried fruits. Canned fruit in light or heavy syrup. Fruit juice. Meat and Other Protein Products Fatty cuts of meat. Ribs, chicken wings, bacon, sausage, bologna, salami, chitterlings, fatback, hot dogs, bratwurst, and packaged luncheon meats. Salted nuts and seeds. Canned beans with salt. Dairy Whole or 2% milk, cream, half-and-half, and cream cheese. Whole-fat or sweetened yogurt. Full-fat cheeses or blue cheese. Nondairy creamers and whipped toppings.  Processed cheese, cheese spreads, or cheese curds. Condiments Onion and garlic salt, seasoned salt, table salt, and sea salt. Canned and packaged gravies. Worcestershire sauce. Tartar sauce. Barbecue sauce. Teriyaki sauce. Soy sauce, including reduced sodium. Steak sauce. Fish sauce. Oyster sauce. Cocktail sauce. Horseradish. Ketchup and mustard. Meat flavorings and tenderizers. Bouillon cubes. Hot sauce. Tabasco sauce. Marinades. Taco seasonings. Relishes. Fats and Oils Butter, stick margarine, lard, shortening, ghee, and bacon fat. Coconut, palm kernel, or palm oils. Regular salad dressings. Other Pickles and olives. Salted popcorn and pretzels. The items listed above may not be a complete list of foods and beverages to avoid. Contact your dietitian for more information. WHERE CAN I FIND MORE INFORMATION? National Heart, Lung, and Blood Institute: travelstabloid.com   This information is not intended to replace advice given to you by your health care provider. Make sure you discuss any questions you have with your health care provider.   Document Released: 04/05/2011 Document Revised: 05/07/2014 Document Reviewed: 02/18/2013 Elsevier Interactive Patient Education 2016 [place]. Diet for Metabolic Syndrome Metabolic syndrome is a disorder that includes at least three of these conditions: Abdominal obesity. Too much sugar in your blood. High blood pressure. Higher than normal amount of fat (lipids) in your blood. Lower than normal level of "good" cholesterol (HDL). Following a healthy  diet can help to keep metabolic syndrome under control. It can also help to prevent the development of conditions that are associated with metabolic syndrome, such as diabetes, heart disease, and stroke. Along with exercise, a healthy diet: Helps to improve the way that the body uses insulin. Promotes weight loss. A common goal for people with this condition is to lose at  least 7 to 10 percent of their starting weight. WHAT DO I NEED TO KNOW ABOUT THIS DIET? Use the glycemic index (GI) to plan your meals. The index tells you how quickly a food will raise your blood sugar. Choose foods that have low GI values. These foods take a longer time to raise blood sugar. Keep track of how many calories you take in. Eating the right amount of calories will help your achieve a healthy weight. You may want to follow a Mediterranean diet. This diet includes lots of vegetables, lean meats or fish, whole grains, fruits, and healthy oils and fats. WHAT FOODS CAN I EAT? Grains Stone-ground whole wheat. Pumpernickel bread. Whole-grain bread, crackers, tortillas, cereal, and pasta. Unsweetened oatmeal.Bulgur.Barley.Quinoa.Brown rice or wild rice. Vegetables Lettuce. Spinach. Peas. Beets. Cauliflower. Cabbage. Broccoli. Carrots. Tomatoes. Squash. Eggplant. Herbs. Peppers. Onions. Cucumbers. Brussels sprouts. Sweet potatoes. Yams. Beans. Lentils. Fruits Berries. Apples. Oranges. Grapes. Mango. Pomegranate. Kiwi. Cherries. Meats and Other Protein Sources Seafood and shellfish. Lean meats.Poultry. Tofu. Dairy Low-fat or fat-free dairy products, such as milk, yogurt, and cheese. Beverages Water. Low-fat milk. Milk alternatives, like soy milk or almond milk. Real fruit juice. Condiments Low-sugar or sugar-free ketchup, barbecue sauce, and mayonnaise. Mustard. Relish. Fats and Oils Avocado. Canola or olive oil. Nuts and nut butters.Seeds. The items listed above may not be a complete list of recommended foods or beverages. Contact your dietitian for more options.  WHAT FOODS ARE NOT RECOMMENDED? Red meat. Palm oil and coconut oil. Processed foods. Fried foods. Alcohol. Sweetened drinks, such as iced tea and soda. Sweets. Salty foods. The items listed above may not be a complete list of foods and beverages to avoid. Contact your dietitian for more information.   This information  is not intended to replace advice given to you by your health care provider. Make sure you discuss any questions you have with your health care provider.   Document Released: 08/31/2014 Document Reviewed: 08/31/2014 Elsevier Interactive Patient Education 2016 [place]. Metabolic Syndrome Metabolic syndrome is the presence of at least three factors that increase your risk of getting cardiovascular disease and diabetes. These factors are:  High blood sugar.  High blood triglyceride level.  High blood pressure.  Low levels of good blood cholesterol (high-density lipoprotein or HDL).  Excess weight around the waist. This factor is present with a waist measurement of:  More than 40 inches in men.  More than 35 inches in women. Metabolic syndrome is sometimes called insulin resistance syndrome and syndrome X. CAUSES The exact cause is not known, but genetics and lifestyle choices play a role. RISK FACTORS You are more likely to develop metabolic syndrome if:  You eat a diet high in calories and saturated fat.  You do not exercise regularly.  You are overweight.  You have a family history of metabolic syndrome.  You are Asian.  You are older in age.  You have insulin resistance.  You use any tobacco products, including cigarettes, chewing tobacco, or electronic cigarettes. SIGNS AND SYMPTOMS Metabolic syndrome has no specific symptoms. DIAGNOSIS To make a diagnosis, your health care provider will determine whether you  have at least three of the factors that make up metabolic syndrome by:  Taking your blood pressure.  Measuring your waist.  Ordering blood tests. TREATMENT Treatment may include:  Lifestyle changes to reduce your risk for heart disease and stroke, such as:  Exercise.  Weight loss.  Maintaining a healthy diet.  Quitting the use of any tobacco products, including cigarettes, chewing tobacco, or electronic cigarettes.  Medicines that:  Help  your body to maintain glucose control.  Reduce your blood pressure and your blood triglyceride levels. HOME CARE INSTRUCTIONS  Exercise regularly.  Maintain a healthy diet.  Do not use any tobacco products, including cigarettes, chewing tobacco, or electronic cigarettes. If you need help quitting, ask your health care provider.  Keep all follow-up visits as directed by your health care provider. This is important.  Measure your waist regularly and record the measurement. To measure your waist:  Stand up straight.  Breathe out.  Wrap the measuring tape around the part of your waist that is just above your hipbones.  Read the measurement. SEEK MEDICAL CARE IF:  You feel very tired.  You develop excessive thirst.  You pass large quantities of urine.  You put on weight around your waist.  You have headaches over and over again.  You have a dizzy spell. SEEK IMMEDIATE MEDICAL CARE IF:  You develop sudden blurred vision.  You develop a sudden dizzy spell.  You have sudden trouble speaking or swallowing.  You have sudden weakness in your arm or leg.  You have chest pains or trouble breathing.  You feel like your heartbeat is abnormal.  You faint.   This information is not intended to replace advice given to you by your health care provider. Make sure you discuss any questions you have with your health care provider.   Document Released: 07/24/2007 Document Revised: 05/07/2014 Document Reviewed: 11/20/2013 Elsevier Interactive Patient Education [employer].

## 2015-04-09 LAB — PSA: PSA: 0.68 ng/mL (ref <=4.00)

## 2015-06-11 ENCOUNTER — Other Ambulatory Visit: Payer: Self-pay | Admitting: Emergency Medicine

## 2015-08-05 ENCOUNTER — Ambulatory Visit (INDEPENDENT_AMBULATORY_CARE_PROVIDER_SITE_OTHER): Payer: 59 | Admitting: Physician Assistant

## 2015-08-05 VITALS — BP 144/102 | HR 116 | Temp 98.3°F | Resp 18 | Ht 71.0 in | Wt 332.0 lb

## 2015-08-05 DIAGNOSIS — I10 Essential (primary) hypertension: Secondary | ICD-10-CM

## 2015-08-05 NOTE — Progress Notes (Signed)
[facility] [address] [place] [address] [phone]  Date:  08/05/2015   Name:  Zachary [name]   DOB:  May 22, 1955   MRN:  [id number]  PCP:  No primary care provider on file.    Chief Complaint: OTHER   History of Present Illness:  This is a 60 y.o. male with PMH HTN who is presenting needing DMV forms filled out. Pt used to be DOT certified and drove a bus. He was always seen at least yearly for high blood pressure. 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 However, yesterday he was driving his personal vehicle and was pulled over for speeding. The officer let him know his driver license was expired d/t medical issues. He went to the [facility] office and he had been put on some sort of medical list d/t his blood pressure. He is needing forms out to clear him for driving a personal vehicle. His blood pressure is generally well controlled with meds although he states he did forget to take his meds today. BP today 144/102. BP has been controlled in the past at [facility] - ranges 126-146/80-94 over the past 2-3 years. He takes hyzaar, doxazosin, metoprolol and amlodipine. He has never had any other issues with his heart. No CHF. No sob/palps/cp with rest or exertion. No hx syncope.  Review of Systems:  Review of Systems See HPI  Patient Active Problem List   Diagnosis Date Noted  . OBESITY 05/09/2009  . Essential hypertension 05/09/2009  . HEMOCCULT POSITIVE STOOL 05/09/2009  . HEART MURMUR, HX OF 05/09/2009    Prior to Admission medications   Medication Sig Start Date End Date Taking? Authorizing Provider  amLODipine (NORVASC) 10 MG tablet Take 1 tablet (10 mg total) by mouth daily. PATIENT NEEDS OFFICE VISIT FOR ADDITIONAL REFILLS 04/08/15  Yes Shawnee Knapp, MD  doxazosin (CARDURA) 4 MG tablet Take 1 tablet (4 mg total) by mouth daily. PATIENT NEEDS OFFICE VISIT FOR ADDITIONAL REFILLS 04/08/15  Yes Shawnee Knapp, MD   losartan-hydrochlorothiazide [facility]) 50-12.5 MG tablet Take 1 tablet by mouth daily. PATIENT NEEDS OFFICE VISIT FOR ADDITIONAL REFILLS 04/08/15  Yes Shawnee Knapp, MD  metoprolol succinate (TOPROL-XL) 100 MG 24 hr tablet Take 1 tablet (100 mg total) by mouth daily. PATIENT NEEDS OFFICE VISIT FOR ADDITIONAL REFILLS 04/08/15  Yes Shawnee Knapp, MD    Allergies  Allergen Reactions  . Penicillins Rash    Past Surgical History  Procedure Laterality Date  . Appendectomy      Social History  Substance Use Topics  . Smoking status: Former Smoker    Quit date: 02/09/1979  . Smokeless tobacco: None  . Alcohol Use: No    Family History  Problem Relation Age of Onset  . Hypertension Mother   . Stroke Father     Medication list has been reviewed and updated.  Physical Examination:  Physical Exam  Constitutional: He is oriented to person, place, and time. He appears well-developed and well-nourished. No distress.  HENT:  Head: Normocephalic and atraumatic.  Right Ear: Hearing normal.  Left Ear: Hearing normal.  Nose: Nose normal.  Eyes: Conjunctivae and lids are normal. Right eye exhibits no discharge. Left eye exhibits no discharge. No scleral icterus.  Pulmonary/Chest: Effort normal. No respiratory distress.  Musculoskeletal: Normal range of motion.  Neurological: He is alert and oriented to person, place, and time.  Skin: Skin is warm, dry  and intact. No lesion and no rash noted.  Psychiatric: He has a normal mood and affect. His speech is normal and behavior is normal. Thought content normal.    BP 144/102 mmHg  Pulse 116  Temp(Src) 98.3 F (36.8 C) (Oral)  Resp 18  Ht 5\' 11"  (1.803 m)  Wt 332 lb (150.594 kg)  BMI 46.33 kg/m2  SpO2 98%  Assessment and Plan:  1. Essential hypertension DMV forms filled out. There is no reason pt should have driving restrictions on his personal vehicle d/t blood pressure. 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   Benjaman Pott Drenda Freeze, MHS [facility]  08/05/2015

## 2015-10-09 IMAGING — CR DG LUMBAR SPINE COMPLETE 4+V
5 series · 5 of 5 positions shown · non-contrast
Comparison: None.

CLINICAL DATA: Low back pain.

EXAM:
LUMBAR SPINE - COMPLETE 4+ VIEW

[AP]
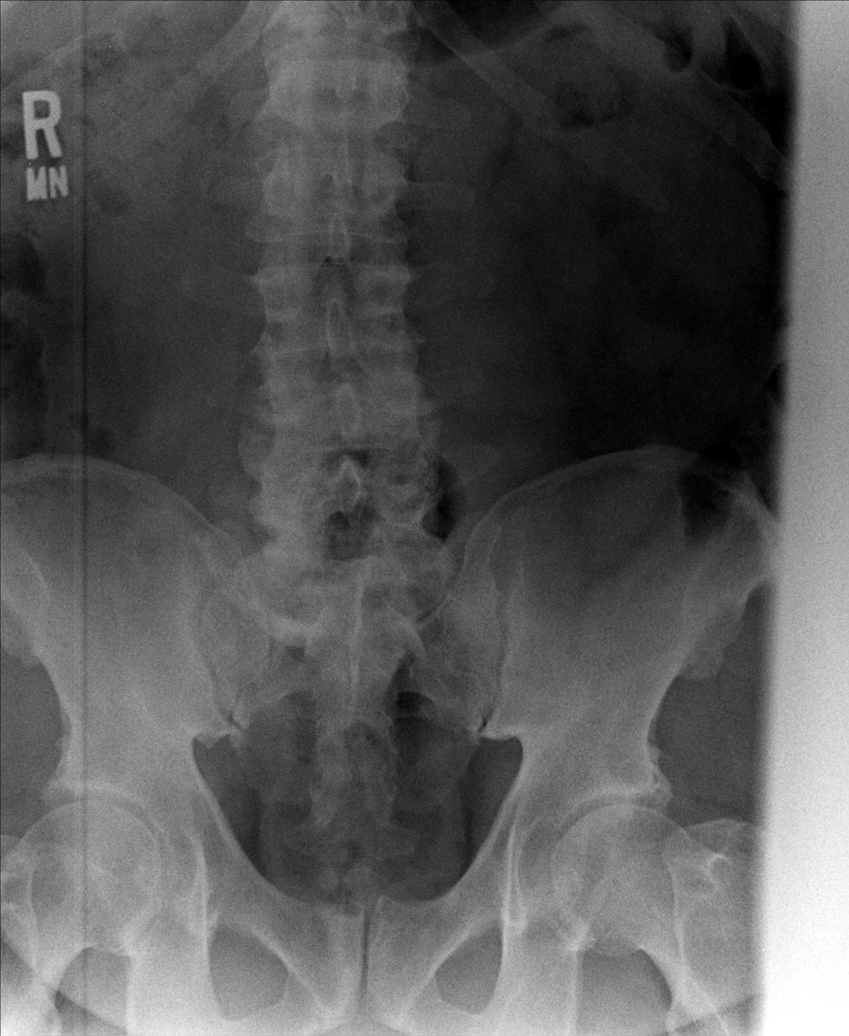

[rpo]
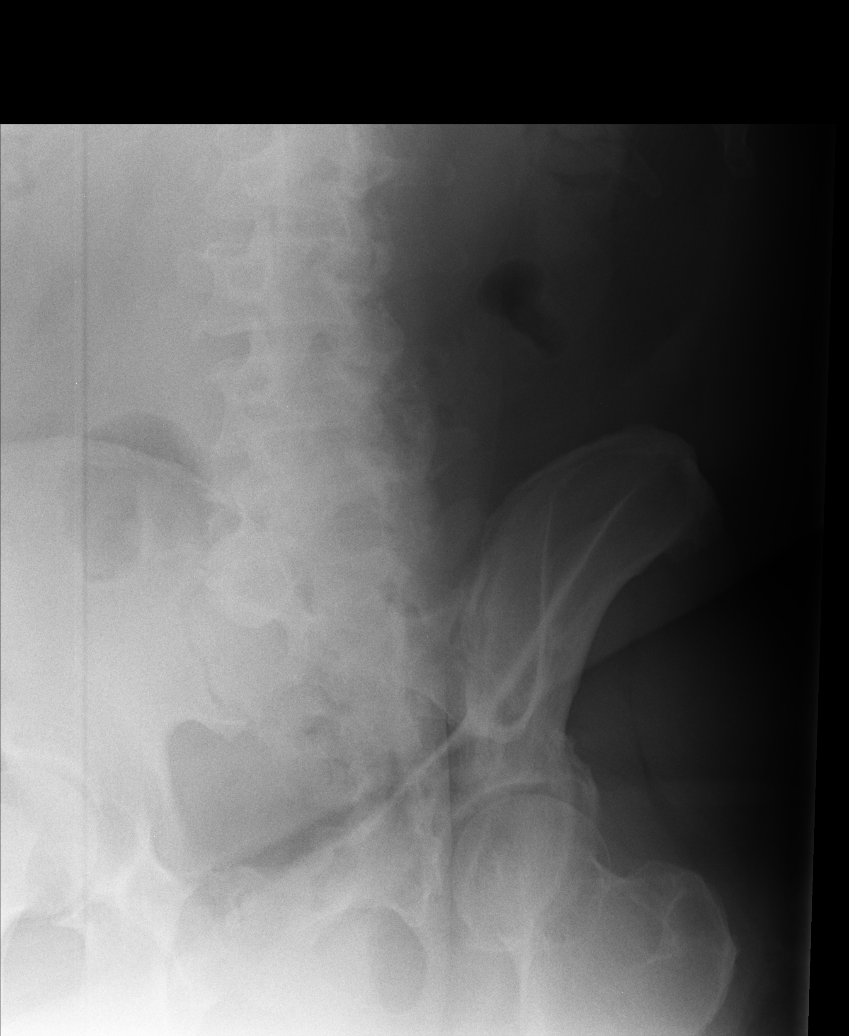

[lpo]
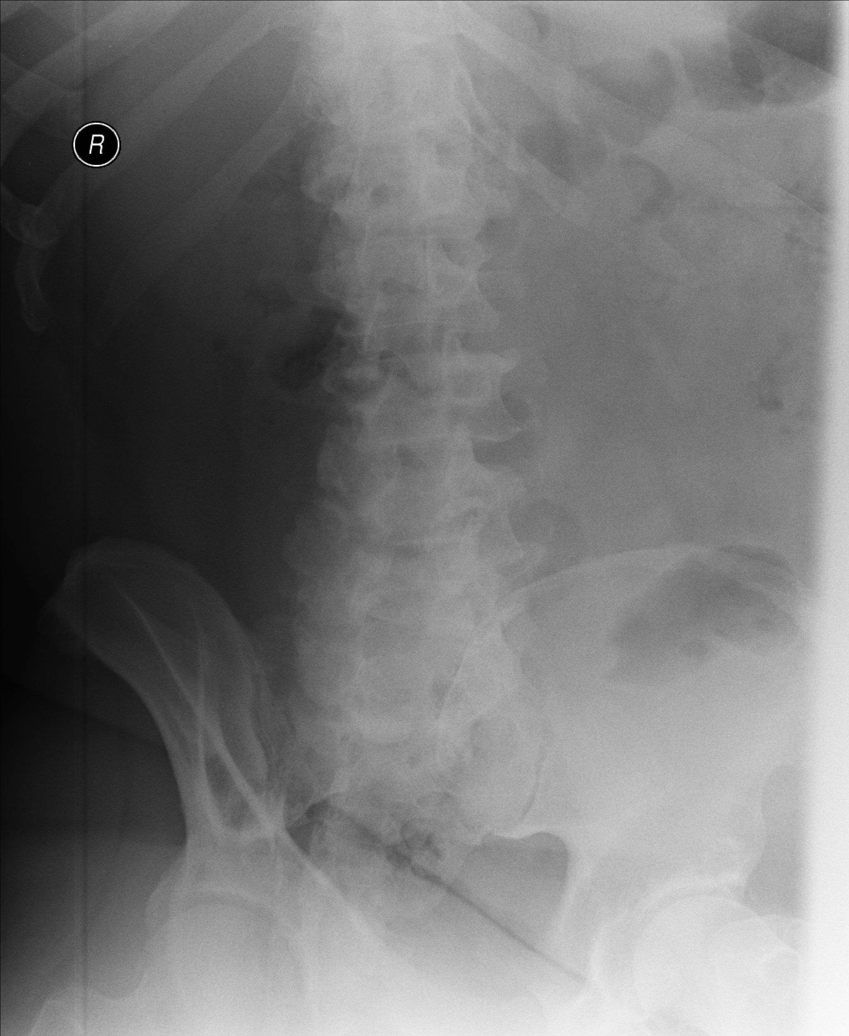

[lateral]
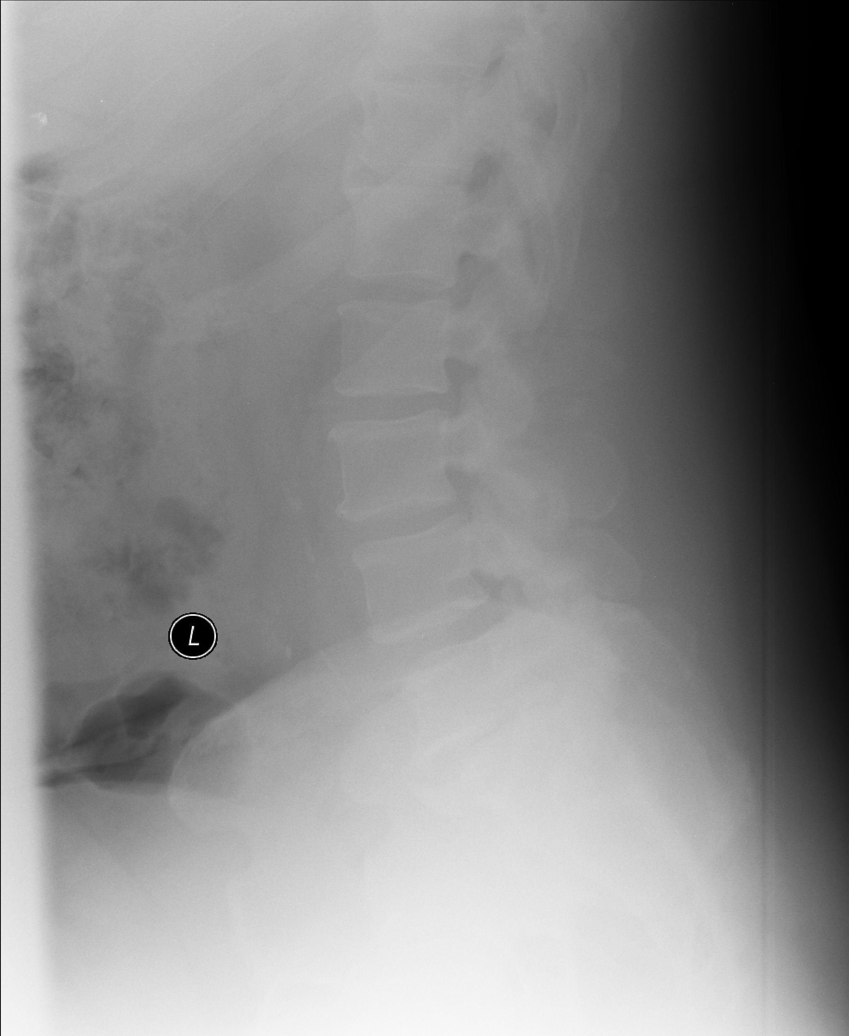

[l5 s1]
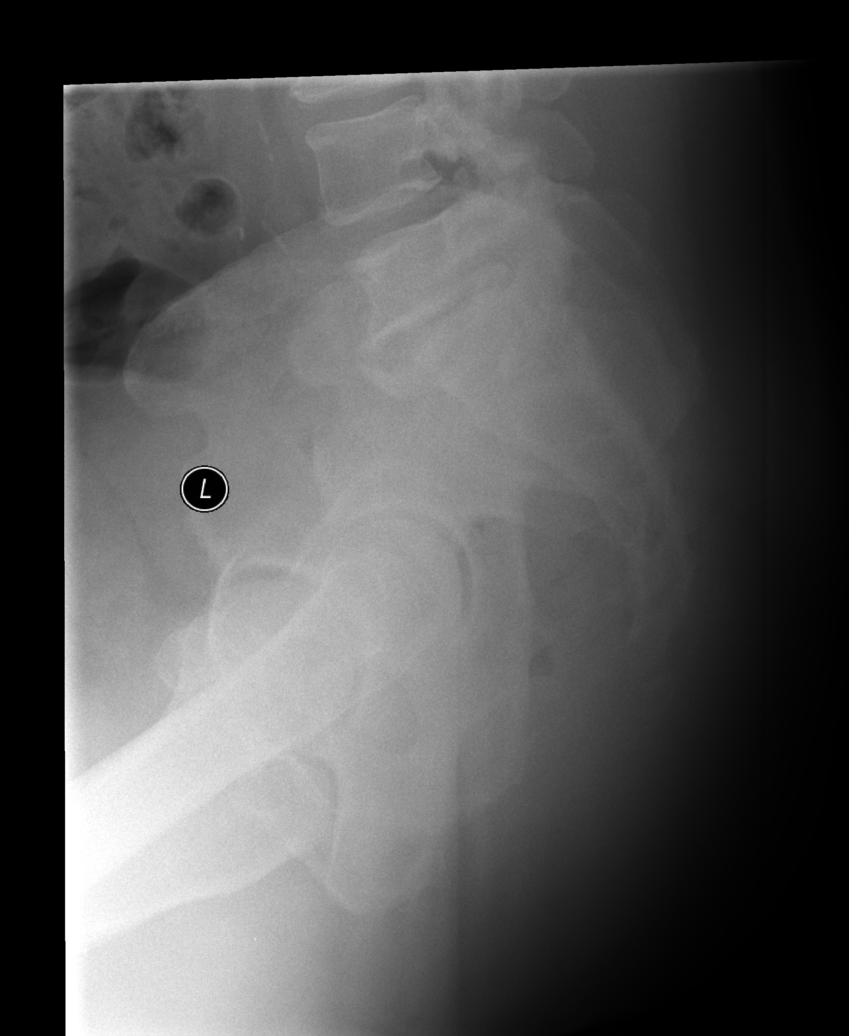

[5 of 5 positions shown; findings below may reference images not displayed]

FINDINGS: Degenerative disc disease changes at L5-S1 with disc space narrowing
and spurring. Advanced degenerative facet disease and bony
overgrowth at L3-4 through L5-S1. Normal alignment. No fracture. SI
joints are symmetric and unremarkable.
IMPRESSION: Mild to moderate degenerative disc disease at L5-S1. Advanced
degenerative facet disease in the mid and lower lumbar spine. No
acute findings.

## 2015-10-22 ENCOUNTER — Ambulatory Visit (INDEPENDENT_AMBULATORY_CARE_PROVIDER_SITE_OTHER): Payer: 59 | Admitting: Osteopathic Medicine

## 2015-10-22 VITALS — BP 125/65 | HR 115 | Temp 98.0°F | Resp 18 | Ht 71.75 in | Wt 313.5 lb

## 2015-10-22 DIAGNOSIS — R7303 Prediabetes: Secondary | ICD-10-CM

## 2015-10-22 DIAGNOSIS — N61 Mastitis without abscess: Secondary | ICD-10-CM

## 2015-10-22 DIAGNOSIS — E8881 Metabolic syndrome: Secondary | ICD-10-CM | POA: Diagnosis not present

## 2015-10-22 DIAGNOSIS — I10 Essential (primary) hypertension: Secondary | ICD-10-CM | POA: Diagnosis not present

## 2015-10-22 LAB — POCT GLYCOSYLATED HEMOGLOBIN (HGB A1C): HEMOGLOBIN A1C: 5.8

## 2015-10-22 MED ORDER — SULFAMETHOXAZOLE-TRIMETHOPRIM 800-160 MG PO TABS: 1.0000 | ORAL_TABLET | Freq: Two times a day (BID) | ORAL | Status: DC

## 2015-10-22 NOTE — Patient Instructions (Addendum)
  If antibiotics are not helping the concerning are on the left breast, and/or if you develop worsening pain or fever/chills, please call us right away, we may need to place a referral for you to have the area drained under ultrasound guidance.   Blood pressure is good - continue current medications and bring your blood pressure cuff to your next office visit.   Prediabetes - consider metformin medication, otherwise good work with diet/exercise changes, would repeat hemoglobin A1c in 6 months   IF you received an x-ray today, you will receive an invoice from [facility]. Please contact [facility] at [phone] with questions or concerns regarding your invoice.   IF you received labwork today, you will receive an invoice from [employer]. Please contact [facility] at [phone] with questions or concerns regarding your invoice.   Our billing staff will not be able to assist you with questions regarding bills from these companies.  You will be contacted with the lab results as soon as they are available. The fastest way to get your results is to activate your My Chart account. Instructions are located on the last page of this paperwork. If you have not heard from [place] regarding the results in 2 weeks, please contact this office.

## 2015-10-22 NOTE — Progress Notes (Signed)
HPI: Zachary [name] is a 60 y.o. male who presents to [facility] today for chief complaint of:  Chief Complaint  Patient presents with  . Cyst    on left side of chest-slightly painful. Noticed 2 days ago  . hypertension, patient not sure    CYST . Location:  L chest/breast tissue few centimeters from nipple on superior/lateral  . Quality: red bump, deep pain . Duration: 2 days . Context: has had these before . Modifying factors: tried to pop it on his own but just made it red/painful . Assoc signs/symptoms: no fever/chills, no nipple discharge   HTN: takes Toprol and Cardura in am, Norvasc and Hyzaar in pm.  No chest pain/pressure.   Past medical, social and family history reviewed: Past Medical History  Diagnosis Date  . Asthma   . Hypertension   . Allergy   . Arthritis    Past Surgical History  Procedure Laterality Date  . Appendectomy     Social History  Substance Use Topics  . Smoking status: Former Smoker    Quit date: 02/09/1979  . Smokeless tobacco: Not on file  . Alcohol Use: No   Family History  Problem Relation Age of Onset  . Hypertension Mother   . Stroke Father     Current Outpatient Prescriptions  Medication Sig Dispense Refill  . amLODipine (NORVASC) 10 MG tablet Take 1 tablet (10 mg total) by mouth daily. PATIENT NEEDS OFFICE VISIT FOR ADDITIONAL REFILLS 30 tablet 11  . doxazosin (CARDURA) 4 MG tablet Take 1 tablet (4 mg total) by mouth daily. PATIENT NEEDS OFFICE VISIT FOR ADDITIONAL REFILLS 30 tablet 11  . losartan-hydrochlorothiazide (HYZAAR) 50-12.5 MG tablet Take 1 tablet by mouth daily. PATIENT NEEDS OFFICE VISIT FOR ADDITIONAL REFILLS 30 tablet 11  . metoprolol succinate (TOPROL-XL) 100 MG 24 hr tablet Take 1 tablet (100 mg total) by mouth daily. PATIENT NEEDS OFFICE VISIT FOR ADDITIONAL REFILLS 30 tablet 11   No current facility-administered medications for this visit.   Allergies  Allergen Reactions  .  Penicillins Rash      Review of Systems: CONSTITUTIONAL:  No  fever, no chills HEAD/EYES/EARS/NOSE/THROAT: No  headache, no vision change, CARDIAC: No  chest pain, No  pressure, No palpitations RESPIRATORY: No  cough, No  shortness of breath MUSCULOSKELETAL: No  myalgia/arthralgia SKIN: (+) rash/wounds/concerning lesions HEM/ONC No  abnormal lymph node   Exam:  BP 125/65 mmHg  Pulse 115  Temp(Src) 98 F (36.7 C) (Oral)  Resp 18  Ht 5' 11.75" (1.822 m)  Wt 313 lb 8 oz (142.203 kg)  BMI 42.84 kg/m2  SpO2 98% Constitutional: VS see above. General Appearance: alert, well-developed, well-nourished, NAD Eyes: Normal lids and conjunctive, non-icteric sclera Ears, Nose, Mouth, Throat: MMM Neck: No masses, trachea midline. No thyroid enlargement/tenderness/mass appreciated. No lymphadenopathy Respiratory: Normal respiratory effort. no wheeze, no rhonchi, no rales Cardiovascular: S1/S2 normal, 1/6 systolic murmur, no rub/gallop auscultated. RRR. No lower extremity edema. Skin: (+) erythema/tenderness to L 2:00 breast close to nipple, no nipple discharge, no other abnormal breast lump. Skin is otherwise warm, dry, intact. No rash/ulcer. No concerning nevi or subq nodules on limited exam.   Psychiatric: Normal judgment/insight. Normal mood and affect. Oriented x3.    Results for orders placed or performed in visit on 10/22/15 (from the past 72 hour(s))  POCT glycosylated hemoglobin (Hb A1C)     Status: None   Collection Time: 10/22/15  8:49 AM  Result Value Ref  Range   Hemoglobin A1C 5.8      Labs reviewed: 03/2015 lipids, CMP, CBC, PSA ok except mild hyperglycemia  ASSESSMENT/PLAN:  Prediabetes - Plan: POCT glycosylated hemoglobin (Hb A1C), POCT glycosylated hemoglobin (Hb A1C)  Essential hypertension  Metabolic syndrome  Mastitis - Plan: sulfamethoxazole-trimethoprim (BACTRIM DS,SEPTRA DS) 800-160 MG tablet    Visit summary printed and instructions reviewed with the  patient. All questions answered. ER/RTC precautions reviewed.  Return in about 6 months (around 04/22/2016), or sooner if needed/as directed if skin infection doesn't resolve with antibiotics, for FOLLOW UP BLOOD PRESSURE and PREDIABETES.

## 2016-01-06 ENCOUNTER — Other Ambulatory Visit: Payer: Self-pay | Admitting: Osteopathic Medicine

## 2016-01-06 DIAGNOSIS — N61 Mastitis without abscess: Secondary | ICD-10-CM

## 2016-04-06 ENCOUNTER — Ambulatory Visit (INDEPENDENT_AMBULATORY_CARE_PROVIDER_SITE_OTHER): Payer: 59 | Admitting: Family Medicine

## 2016-04-06 VITALS — BP 132/80 | HR 88 | Temp 97.5°F | Resp 17 | Ht 72.5 in | Wt 323.0 lb

## 2016-04-06 DIAGNOSIS — R7303 Prediabetes: Secondary | ICD-10-CM | POA: Diagnosis not present

## 2016-04-06 DIAGNOSIS — I10 Essential (primary) hypertension: Secondary | ICD-10-CM | POA: Diagnosis not present

## 2016-04-06 MED ORDER — LOSARTAN POTASSIUM-HCTZ 50-12.5 MG PO TABS: 1.0000 | ORAL_TABLET | Freq: Every day | ORAL | 11 refills | Status: DC

## 2016-04-06 MED ORDER — DOXAZOSIN MESYLATE 4 MG PO TABS: 4.0000 mg | ORAL_TABLET | Freq: Every day | ORAL | 11 refills | Status: DC

## 2016-04-06 MED ORDER — AMLODIPINE BESYLATE 10 MG PO TABS: 10.0000 mg | ORAL_TABLET | Freq: Every day | ORAL | 11 refills | Status: DC

## 2016-04-06 MED ORDER — METOPROLOL SUCCINATE ER 100 MG PO TB24: 100.0000 mg | ORAL_TABLET | Freq: Every day | ORAL | 11 refills | Status: DC

## 2016-04-06 NOTE — Patient Instructions (Addendum)
Return for follow-up in 12 months or sooner if needed.   IF you received an x-ray today, you will receive an invoice from [facility]. Please contact [facility] at [phone] with questions or concerns regarding your invoice.   IF you received labwork today, you will receive an invoice from [employer]. Please contact [facility] at [phone] with questions or concerns regarding your invoice.   Our billing staff will not be able to assist you with questions regarding bills from these companies.  You will be contacted with the lab results as soon as they are available. The fastest way to get your results is to activate your My Chart account. Instructions are located on the last page of this paperwork. If you have not heard from [place] regarding the results in 2 weeks, please contact this office.      Hypertension Hypertension is another name for high blood pressure. High blood pressure forces your heart to work harder to pump blood. A blood pressure reading has two numbers, which includes a higher number over a lower number (example: 110/72). Follow these instructions at home:  Have your blood pressure rechecked by your doctor.  Only take medicine as told by your doctor. Follow the directions carefully. The medicine does not work as well if you skip doses. Skipping doses also puts you at risk for problems.  Do not smoke.  Monitor your blood pressure at home as told by your doctor. Contact a doctor if:  You think you are having a reaction to the medicine you are taking.  You have repeat headaches or feel dizzy.  You have puffiness (swelling) in your ankles.  You have trouble with your vision. Get help right away if:  You get a very bad headache and are confused.  You feel weak, numb, or faint.  You get chest or belly (abdominal) pain.  You throw up (vomit).  You cannot breathe very well. This information is not intended to replace  advice given to you by your health care provider. Make sure you discuss any questions you have with your health care provider. Document Released: 10/03/2007 Document Revised: 09/22/2015 Document Reviewed: 02/06/2013 Elsevier Interactive Patient Education  2017 [employer].

## 2016-04-06 NOTE — Progress Notes (Signed)
Patient ID: JADARRIAN BRUZEK, male    DOB: 05/24/1955, 60 y.o.   MRN: [id number]  PCP: Kathlen Brunswick, PA-C  Chief Complaint  Patient presents with  . Medication Refill    all on list     Subjective:   HPI 60 year old male presents for hypertension follow-up. He is established here at [facility] but presents as a new patient to me today.  Pt reports home monitoring of blood pressure with the highest systolic readings in [id number] and diastolic readings in the low 80's. He also routinely checks his pulse and reports readings in 60-70. Denies shortness of breath, chest pain, or headache.  During prior visit, pt was dx as prediabetes and since that time he routinely checks his blood sugar. Average fasting pressure is 110-120. Reports routine physical activity include push-ups and walking up and down steps. 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  Social History   Social History  . Marital status: Married    Spouse name: N/A  . Number of children: N/A  . Years of education: N/A   Occupational History  . Not on file.   Social History Main Topics  . Smoking status: Former Smoker    Quit date: 02/09/1979  . Smokeless tobacco: Not on file  . Alcohol use No  . Drug use: No  . Sexual activity: Yes   Other Topics Concern  . Not on file   Social History Narrative  . No narrative on file    Family History  Problem Relation Age of Onset  . Hypertension Mother   . Stroke Father    Review of Systems See HPI   Patient Active Problem List   Diagnosis Date Noted  . OBESITY 05/09/2009  . Essential hypertension 05/09/2009  . HEMOCCULT POSITIVE STOOL 05/09/2009  . HEART MURMUR, HX OF 05/09/2009     Prior to Admission medications   Medication Sig Start Date End Date Taking? Authorizing Provider  amLODipine (NORVASC) 10 MG tablet Take 1 tablet (10 mg total) by mouth daily. PATIENT NEEDS OFFICE VISIT FOR ADDITIONAL REFILLS 04/08/15  Yes Shawnee Knapp, MD  doxazosin (CARDURA) 4 MG tablet Take  1 tablet (4 mg total) by mouth daily. PATIENT NEEDS OFFICE VISIT FOR ADDITIONAL REFILLS 04/08/15  Yes Shawnee Knapp, MD  losartan-hydrochlorothiazide [facility]) 50-12.5 MG tablet Take 1 tablet by mouth daily. PATIENT NEEDS OFFICE VISIT FOR ADDITIONAL REFILLS 04/08/15  Yes Shawnee Knapp, MD  metoprolol succinate (TOPROL-XL) 100 MG 24 hr tablet Take 1 tablet (100 mg total) by mouth daily. PATIENT NEEDS OFFICE VISIT FOR ADDITIONAL REFILLS 04/08/15  Yes Shawnee Knapp, MD     Allergies  Allergen Reactions  . Penicillins Rash       Objective:  Physical Exam  Constitutional: He is oriented to person, place, and time. He appears well-developed and well-nourished.  HENT:  Head: Normocephalic and atraumatic.  Right Ear: External ear normal.  Left Ear: External ear normal.  Nose: Nose normal.  Eyes: Pupils are equal, round, and reactive to light.  Neck: Normal range of motion. Neck supple.  Cardiovascular: Normal rate, regular rhythm and intact distal pulses.   Murmur heard. Pulmonary/Chest: Effort normal and breath sounds normal.  Musculoskeletal: Normal range of motion.  Neurological: He is alert and oriented to person, place, and time.  Skin: Skin is warm and dry.  Psychiatric: He has a normal mood and affect. His behavior is normal. Judgment and thought content normal.    Vitals:   04/06/16 0804  BP: 132/80  Pulse: 88  Resp: 17  Temp: 97.5 F (36.4 C)    Assessment & Plan:  1. Essential hypertension 2. Prediabetes  60 year old AA male in today for a antihypertension medication refill. Hypertension appears to be well-controlled according to today's readings and patient is routinely monitoring pressures at home with the highest readings in low 123456 systolic and diastolic range [id number].  Pt was dx with prediabetes over  1 year ago and routinely checks blood sugar with readings 110-120. Checking CMP, lipid panel, thyroid function, and hemoglobin A1C today. If labs are consistent with previous,  will not make any changes to medication regimen. If lipids are elevated will consider statin therapy as patient is obese and has HTN which increases risk for a cardiovascular event.  Will follow-up with labs.  Return for follow-up in 12 months.  Carroll Sage. Kenton Kingfisher, MSN, FNP-[facility]

## 2016-04-07 LAB — CBC WITH DIFFERENTIAL/PLATELET
BASOS ABS: 0 10*3/uL (ref 0.0–0.2)
Basos: 0 %
EOS (ABSOLUTE): 0.1 10*3/uL (ref 0.0–0.4)
EOS: 1 %
HEMOGLOBIN: 15.4 g/dL (ref 13.0–17.7)
Hematocrit: 44.5 % (ref 37.5–51.0)
IMMATURE GRANS (ABS): 0 10*3/uL (ref 0.0–0.1)
IMMATURE GRANULOCYTES: 0 %
LYMPHS: 18 %
Lymphocytes Absolute: 1.1 10*3/uL (ref 0.7–3.1)
MCH: 29.4 pg (ref 26.6–33.0)
MCHC: 34.6 g/dL (ref 31.5–35.7)
MCV: 85 fL (ref 79–97)
MONOCYTES: 9 %
Monocytes Absolute: 0.5 10*3/uL (ref 0.1–0.9)
NEUTROS PCT: 72 %
Neutrophils Absolute: 4.1 10*3/uL (ref 1.4–7.0)
Platelets: 189 10*3/uL (ref 150–379)
RBC: 5.23 x10E6/uL (ref 4.14–5.80)
RDW: 15.1 % (ref 12.3–15.4)
WBC: 5.8 10*3/uL (ref 3.4–10.8)

## 2016-04-07 LAB — CMP14+EGFR
ALBUMIN: 4.4 g/dL (ref 3.5–5.5)
ALK PHOS: 59 IU/L (ref 39–117)
ALT: 28 IU/L (ref 0–44)
AST: 29 IU/L (ref 0–40)
Albumin/Globulin Ratio: 1.3 (ref 1.2–2.2)
BUN / CREAT RATIO: 21 — AB (ref 9–20)
BUN: 20 mg/dL (ref 6–24)
Bilirubin Total: 0.5 mg/dL (ref 0.0–1.2)
CALCIUM: 9.4 mg/dL (ref 8.7–10.2)
CO2: 21 mmol/L (ref 18–29)
CREATININE: 0.94 mg/dL (ref 0.76–1.27)
Chloride: 99 mmol/L (ref 96–106)
GFR calc Af Amer: 102 mL/min/{1.73_m2} (ref >59)
GFR, EST NON AFRICAN AMERICAN: 88 mL/min/{1.73_m2} (ref >59)
GLUCOSE: 116 mg/dL — AB (ref 65–99)
Globulin, Total: 3.5 g/dL (ref 1.5–4.5)
Potassium: 4.3 mmol/L (ref 3.5–5.2)
Sodium: 142 mmol/L (ref 134–144)
Total Protein: 7.9 g/dL (ref 6.0–8.5)

## 2016-04-07 LAB — LIPID PANEL
CHOLESTEROL TOTAL: 175 mg/dL (ref 100–199)
Chol/HDL Ratio: 4 ratio units (ref 0.0–5.0)
HDL: 44 mg/dL (ref >39)
LDL Calculated: 122 mg/dL — ABNORMAL HIGH (ref 0–99)
TRIGLYCERIDES: 46 mg/dL (ref 0–149)
VLDL CHOLESTEROL CAL: 9 mg/dL (ref 5–40)

## 2016-04-07 LAB — THYROID PANEL WITH TSH
Free Thyroxine Index: 1.9 (ref 1.2–4.9)
T3 Uptake Ratio: 28 % (ref 24–39)
T4 TOTAL: 6.7 ug/dL (ref 4.5–12.0)
TSH: 0.665 u[IU]/mL (ref 0.450–4.500)

## 2016-04-07 LAB — HEMOGLOBIN A1C
ESTIMATED AVERAGE GLUCOSE: 117 mg/dL
Hgb A1c MFr Bld: 5.7 % — ABNORMAL HIGH (ref 4.8–5.6)

## 2016-12-24 DIAGNOSIS — Z23 Encounter for immunization: Secondary | ICD-10-CM | POA: Diagnosis not present

## 2017-03-20 ENCOUNTER — Encounter: Payer: Self-pay | Admitting: Gastroenterology

## 2017-03-25 ENCOUNTER — Encounter: Payer: Self-pay | Admitting: Family Medicine

## 2017-03-25 ENCOUNTER — Ambulatory Visit (INDEPENDENT_AMBULATORY_CARE_PROVIDER_SITE_OTHER): Payer: 59 | Admitting: Family Medicine

## 2017-03-25 ENCOUNTER — Other Ambulatory Visit: Payer: Self-pay

## 2017-03-25 VITALS — BP 170/90 | HR 115 | Temp 97.9°F | Resp 16 | Ht 71.85 in | Wt 337.0 lb

## 2017-03-25 DIAGNOSIS — Z0000 Encounter for general adult medical examination without abnormal findings: Secondary | ICD-10-CM | POA: Diagnosis not present

## 2017-03-25 DIAGNOSIS — Z13220 Encounter for screening for lipoid disorders: Secondary | ICD-10-CM

## 2017-03-25 DIAGNOSIS — Z125 Encounter for screening for malignant neoplasm of prostate: Secondary | ICD-10-CM

## 2017-03-25 DIAGNOSIS — E66813 Obesity, class 3: Secondary | ICD-10-CM

## 2017-03-25 DIAGNOSIS — Z1211 Encounter for screening for malignant neoplasm of colon: Secondary | ICD-10-CM | POA: Diagnosis not present

## 2017-03-25 DIAGNOSIS — Z1159 Encounter for screening for other viral diseases: Secondary | ICD-10-CM

## 2017-03-25 DIAGNOSIS — Z114 Encounter for screening for human immunodeficiency virus [HIV]: Secondary | ICD-10-CM

## 2017-03-25 DIAGNOSIS — Z6841 Body Mass Index (BMI) 40.0 and over, adult: Secondary | ICD-10-CM

## 2017-03-25 DIAGNOSIS — Z23 Encounter for immunization: Secondary | ICD-10-CM | POA: Diagnosis not present

## 2017-03-25 DIAGNOSIS — R6882 Decreased libido: Secondary | ICD-10-CM

## 2017-03-25 DIAGNOSIS — N529 Male erectile dysfunction, unspecified: Secondary | ICD-10-CM

## 2017-03-25 DIAGNOSIS — R7303 Prediabetes: Secondary | ICD-10-CM | POA: Diagnosis not present

## 2017-03-25 DIAGNOSIS — I10 Essential (primary) hypertension: Secondary | ICD-10-CM

## 2017-03-25 MED ORDER — AMLODIPINE BESYLATE 10 MG PO TABS: 10.0000 mg | ORAL_TABLET | Freq: Every day | ORAL | 1 refills | Status: DC

## 2017-03-25 MED ORDER — METOPROLOL SUCCINATE ER 100 MG PO TB24: 100.0000 mg | ORAL_TABLET | Freq: Every day | ORAL | 1 refills | Status: DC

## 2017-03-25 MED ORDER — LOSARTAN POTASSIUM-HCTZ 100-12.5 MG PO TABS: 1.0000 | ORAL_TABLET | Freq: Every day | ORAL | 1 refills | Status: DC

## 2017-03-25 MED ORDER — DOXAZOSIN MESYLATE 4 MG PO TABS: 4.0000 mg | ORAL_TABLET | Freq: Every day | ORAL | 1 refills | Status: DC

## 2017-03-25 MED ORDER — ZOSTER VAC RECOMB ADJUVANTED 50 MCG/0.5ML IM SUSR: 0.5000 mL | Freq: Once | INTRAMUSCULAR | 1 refills | Status: AC

## 2017-03-25 NOTE — Addendum Note (Signed)
Addended by: Merri Ray R on: 03/25/2017 07:34 PM   Modules accepted: Level of Service

## 2017-03-25 NOTE — Progress Notes (Signed)
Subjective:  By signing my name below, I, Essence Howell, attest that this documentation has been prepared under the direction and in the presence of Wendie Agreste, MD Electronically Signed: Ladene Artist, ED Scribe 03/25/2017 at 8:26 AM.   Patient ID: Zachary [name], male    DOB: Dec 29, 1955, 61 y.o.   MRN: [id number]  Chief Complaint  Patient presents with  . Annual Exam   HPI Zachary [name] is a 61 y.o. male who presents to Primary Care at [facility] for an annual exam. H/o HTN, obesity.   HTN Last seen 03/2016 by Molli Barrows, NP. BP was controlled at 132/80 at that time. Amlodipine 10 mg, Cardura 4 mg qd, Toprol-XL 100 mg qd and Hyzaar 50-12.5 mg qd. Pt has been compliant with all medications but states he skipped a dose of Hyzaar yesterday in order to take his last dose today. He reports home BP readings of 130-140/80-85 with a pulse averaging 78 at home. Pt has noticed decreased libido for the past 7 months but is able to maintain erections; he is unsure if this is a side-effect from medications. Pt suspects that elevated BP may be due to recent pelvic, back and bilateral hand pain. Denies light-headedness, dizziness, HA, sob, cp, blood in stools, melena.  Pre-DM Lab Results  Component Value Date   HGBA1C 5.7 (H) 04/06/2016  Pt reports morning fasting glucose of 115-121 over the past 2-3 weeks, but reports checking his glucose after eating lunch on 2 different meters with a reading of 82.  Wt Readings from Last 3 Encounters:  03/25/17 (!) 337 lb (152.9 kg)  04/06/16 (!) 323 lb (146.5 kg)  10/22/15 (!) 313 lb 8 oz (142.2 kg)  Body mass index is 45.9 kg/m.   Hyperlipidemia Lab Results  Component Value Date   CHOL 175 04/06/2016   HDL 44 04/06/2016   LDLCALC 122 (H) 04/06/2016   TRIG 46 04/06/2016   CHOLHDL 4.0 04/06/2016   Lab Results  Component Value Date   ALT 28 04/06/2016   AST 29 04/06/2016   ALKPHOS 59 04/06/2016   BILITOT 0.5 04/06/2016    Arthralgias/Myalias  Pt reports recent pelvic, back and bilateral thumb pain in the mornings. He suspects pain may be arthritis. Plans to return to f/u on this issue.   CA Screening Colonoscopy: 01/2007, normal. Repeat 10 years Prostate CA Screening: Lab Results  Component Value Date   PSA 0.68 04/08/2015   PSA 1.01 03/20/2014   PSA 0.92 02/09/2012   Immunizations Immunization History  Administered Date(s) Administered  . Influenza, Seasonal, Injecte, Preservative Fre 05/24/2012  . Influenza,inj,Quad PF,6+ Mos 02/21/2013, 03/20/2014  Tetanus: due today Shingles: plans to have this done at [facility] but they ran out of vaccine Flu: done at an outside pharmacy  Hep C Screening and HIV Screening: agrees to have done at this visit  Depression Screening Depression screen [facility] 2/9 03/25/2017 04/06/2016 10/22/2015 08/05/2015 04/08/2015  Decreased Interest 0 0 0 0 0  Down, Depressed, Hopeless 0 0 0 0 0  PHQ - 2 Score 0 0 0 0 0     Visual Acuity Screening   Right eye Left eye Both eyes  Without correction:     With correction: 20/20 20/20 20/15    Exercise: walks 6 flights of stairs/daily   Patient Active Problem List   Diagnosis Date Noted  . OBESITY 05/09/2009  . Essential hypertension 05/09/2009  . HEMOCCULT POSITIVE STOOL 05/09/2009  . HEART MURMUR, HX OF 05/09/2009   Past  Medical History:  Diagnosis Date  . Allergy   . Arthritis   . Asthma   . Hypertension    Past Surgical History:  Procedure Laterality Date  . APPENDECTOMY     Allergies  Allergen Reactions  . Penicillins Rash   Prior to Admission medications   Medication Sig Start Date End Date Taking? Authorizing Provider  amLODipine (NORVASC) 10 MG tablet Take 1 tablet (10 mg total) by mouth daily. PATIENT NEEDS OFFICE VISIT FOR ADDITIONAL REFILLS 04/06/16   Scot Jun, FNP  doxazosin (CARDURA) 4 MG tablet Take 1 tablet (4 mg total) by mouth daily. PATIENT NEEDS OFFICE VISIT FOR ADDITIONAL REFILLS 04/06/16    Scot Jun, FNP  losartan-hydrochlorothiazide (HYZAAR) 50-12.5 MG tablet Take 1 tablet by mouth daily. PATIENT NEEDS OFFICE VISIT FOR ADDITIONAL REFILLS 04/06/16   Scot Jun, FNP  metoprolol succinate (TOPROL-XL) 100 MG 24 hr tablet Take 1 tablet (100 mg total) by mouth daily. PATIENT NEEDS OFFICE VISIT FOR ADDITIONAL REFILLS 04/06/16   Scot Jun, FNP   Social History   Socioeconomic History  . Marital status: Married    Spouse name: Not on file  . Number of children: Not on file  . Years of education: Not on file  . Highest education level: Not on file  Social Needs  . Financial resource strain: Not on file  . Food insecurity - worry: Not on file  . Food insecurity - inability: Not on file  . Transportation needs - medical: Not on file  . Transportation needs - non-medical: Not on file  Occupational History  . Not on file  Tobacco Use  . Smoking status: Former Smoker    Last attempt to quit: 02/09/1979    Years since quitting: 38.1  . Smokeless tobacco: Never Used  Substance and Sexual Activity  . Alcohol use: No    Alcohol/week: 0.0 oz  . Drug use: No  . Sexual activity: Yes  Other Topics Concern  . Not on file  Social History Narrative  . Not on file   Review of Systems  Constitutional: Negative for fatigue and unexpected weight change.  Eyes: Negative for visual disturbance.  Respiratory: Negative for cough, chest tightness and shortness of breath.   Cardiovascular: Negative for chest pain, palpitations and leg swelling.  Gastrointestinal: Negative for abdominal pain and blood in stool.  Musculoskeletal: Positive for arthralgias, back pain and myalgias.  Neurological: Negative for dizziness, light-headedness and headaches.      Objective:   Physical Exam  Constitutional: He is oriented to person, place, and time. He appears well-developed and well-nourished.  HENT:  Head: Normocephalic and atraumatic.  Right Ear: External ear normal.   Left Ear: External ear normal.  Mouth/Throat: Oropharynx is clear and moist.  Eyes: Conjunctivae and EOM are normal. Pupils are equal, round, and reactive to light.  Neck: Normal range of motion. Neck supple. No JVD present. Carotid bruit is not present. No thyromegaly present.  Cardiovascular: Normal rate, regular rhythm, normal heart sounds and intact distal pulses.  No murmur heard. Pulmonary/Chest: Effort normal and breath sounds normal. No respiratory distress. He has no wheezes. He has no rales.  Abdominal: Soft. He exhibits no distension. There is no tenderness. Hernia confirmed negative in the right inguinal area and confirmed negative in the left inguinal area.  Genitourinary: Prostate normal.  Musculoskeletal: Normal range of motion. He exhibits no edema or tenderness.  Lymphadenopathy:    He has no cervical adenopathy.  Neurological: He is  alert and oriented to person, place, and time. He has normal reflexes.  Skin: Skin is warm and dry.  Psychiatric: He has a normal mood and affect. His behavior is normal.  Vitals reviewed.  Vitals:   03/25/17 0812  BP: (!) 170/90  Pulse: (!) 115  Resp: 16  Temp: 97.9 F (36.6 C)  TempSrc: Oral  SpO2: 96%  Weight: (!) 337 lb (152.9 kg)  Height: 5' 11.85" (1.825 m)      Assessment & Plan:   Zachary [name] is a 61 y.o. male Annual physical exam  - -anticipatory guidance as below in AVS, screening labs above. Health maintenance items as above in HPI discussed/recommended as applicable.   Prediabetes - Plan: Hemoglobin A1c  - Importance of weight loss discussed. Recheck A1c based on elevated home readings.  Essential hypertension - Plan: Comprehensive metabolic panel, metoprolol succinate (TOPROL-XL) 100 MG 24 hr tablet, doxazosin (CARDURA) 4 MG tablet, amLODipine (NORVASC) 10 MG tablet, losartan-hydrochlorothiazide (HYZAAR) 100-12.5 MG tablet  -Elevated even with missing 1 dose yesterday suspect more medication needed by home  readings.  -Change losartan HCTZ to 100/12.5 mg combo. Continue Toprol, Cardura, and Norvasc at same doses. Labs pending  Screen for colon cancer - Plan: Ambulatory referral to Gastroenterology  Need for Tdap vaccination - Plan: Tdap vaccine greater than or equal to 7yo IM  Need for hepatitis C screening test - Plan: Hepatitis C antibody  Prostate cancer screening, Screening for prostate cancer - Plan: PSA  -We discussed pros and cons of prostate cancer screening, and after this discussion, he chose to have screening done. PSA obtained, and no concerning findings on DRE.   Encounter for screening for HIV - Plan: HIV antibody  Screening for hyperlipidemia - Plan: Lipid panel  Class 3 severe obesity with body mass index (BMI) of 45.0 to 49.9 in adult, unspecified obesity type, unspecified whether serious comorbidity present ([place])  - Weight loss discussed with diet/increased activity/exercise. If his level BMI would consider meeting with a nutritionist or bariatric specialist. Plan to follow-up in the next few weeks and can discuss further at that time.  Need for shingles vaccine - Plan: Zoster Vaccine Adjuvanted [facility]) injection sent to pharmacy  Decreased libido - Plan: Testosterone Erectile dysfunction, unspecified erectile dysfunction type - Plan: Testosterone  -Check testosterone, return to discuss symptoms further. Does admit to some increased stress/anxiety recently, maybe contributing to libido and erectile dysfunction. Also will check blood sugar as above and improved BP control may also help. Recheck next few weeks.   He also noted some various arthralgias, but plans to return for separate visit to discuss due to other problems addressed at this visit in addition to physical.   Meds ordered this encounter  Medications  . Zoster Vaccine Adjuvanted [facility]) injection    Sig: Inject 0.5 mLs into the muscle once for 1 dose. Repeat in 2-6 months.    Dispense:  0.5 mL     Refill:  1  . metoprolol succinate (TOPROL-XL) 100 MG 24 hr tablet    Sig: Take 1 tablet (100 mg total) by mouth daily.    Dispense:  90 tablet    Refill:  1  . doxazosin (CARDURA) 4 MG tablet    Sig: Take 1 tablet (4 mg total) by mouth daily.    Dispense:  90 tablet    Refill:  1  . amLODipine (NORVASC) 10 MG tablet    Sig: Take 1 tablet (10 mg total) by mouth daily.  Dispense:  90 tablet    Refill:  1  . losartan-hydrochlorothiazide (HYZAAR) 100-12.5 MG tablet    Sig: Take 1 tablet by mouth daily.    Dispense:  90 tablet    Refill:  1   Patient Instructions   Blood pressure is running a little too high. Increased dose of combo pill and recheck in next few weeks. We can discuss different aches at that time as well. Return to the clinic or go to the nearest emergency room if any of your symptoms worsen or new symptoms occur.  I will check diabetes test today. Can discuss that further next visit as well.   I will check testosterone level for decreased drive, but please return to discuss those symptoms further once we have level back.   150 minutes exercise per week minimum.  We can discuss weight loss options further next visit, such as nutritionist or meeting with bariatric specialist.   Keeping you healthy  Get these tests  Blood pressure- Have your blood pressure checked once a year by your healthcare provider.  Normal blood pressure is 120/80  Weight- Have your body mass index (BMI) calculated to screen for obesity.  BMI is a measure of body fat based on height and weight. You can also calculate your own BMI at [email or web address].  Cholesterol- Have your cholesterol checked every year.  Diabetes- Have your blood sugar checked regularly if you have high blood pressure, high cholesterol, have a family history of diabetes or if you are overweight.  Screening for Colon Cancer- Colonoscopy starting at age 80.  Screening may begin sooner depending on your family history  and other health conditions. Follow up colonoscopy as directed by your Gastroenterologist.  Screening for Prostate Cancer- Both blood work (PSA) and a rectal exam help screen for Prostate Cancer.  Screening begins at age 56 with African-American men and at age 21 with Caucasian men.  Screening may begin sooner depending on your family history.  Take these medicines  Aspirin- One aspirin daily can help prevent Heart disease and Stroke.  Flu shot- Every fall.  Tetanus- Every 10 years.  Zostavax- Once after the age of 54 to prevent Shingles.  Pneumonia shot- Once after the age of 48; if you are younger than 10, ask your healthcare provider if you need a Pneumonia shot.  Take these steps  Don't smoke- If you do smoke, talk to your doctor about quitting.  For tips on how to quit, go to www.smokefree.gov or call 1-800-QUIT-NOW.  Be physically active- Exercise 5 days a week for at least 30 minutes.  If you are not already physically active start slow and gradually work up to 30 minutes of moderate physical activity.  Examples of moderate activity include walking briskly, mowing the yard, dancing, swimming, bicycling, etc.  Eat a healthy diet- Eat a variety of healthy food such as fruits, vegetables, low fat milk, low fat cheese, yogurt, lean meant, poultry, fish, beans, tofu, etc. For more information go to www.thenutritionsource.org  Drink alcohol in moderation- Limit alcohol intake to less than two drinks a day. Never drink and drive.  Dentist- Brush and floss twice daily; visit your dentist twice a year.  Depression- Your emotional health is as important as your physical health. If you're feeling down, or losing interest in things you would normally enjoy please talk to your healthcare provider.  Eye exam- Visit your eye doctor every year.  Safe sex- If you may be exposed to a sexually transmitted infection, use  a condom.  Seat belts- Seat belts can save your life; always wear  one.  Smoke/Carbon Monoxide detectors- These detectors need to be installed on the appropriate level of your home.  Replace batteries at least once a year.  Skin cancer- When out in the sun, cover up and use sunscreen 15 SPF or higher.  Violence- If anyone is threatening you, please tell your healthcare provider.  Living Will/ Health care power of attorney- Speak with your healthcare provider and family.   IF you received an x-ray today, you will receive an invoice from [facility]. Please contact [facility] at [phone] with questions or concerns regarding your invoice.   IF you received labwork today, you will receive an invoice from [place]. Please contact [facility] at [phone] with questions or concerns regarding your invoice.   Our billing staff will not be able to assist you with questions regarding bills from these companies.  You will be contacted with the lab results as soon as they are available. The fastest way to get your results is to activate your My Chart account. Instructions are located on the last page of this paperwork. If you have not heard from [place] regarding the results in 2 weeks, please contact this office.      I personally performed the services described in this documentation, which was scribed in my presence. The recorded information has been reviewed and considered for accuracy and completeness, addended by me as needed, and agree with information above.  Signed,   Merri Ray, MD Primary Care at [facility].  03/25/17 9:17 AM

## 2017-03-25 NOTE — Patient Instructions (Addendum)
Blood pressure is running a little too high. Increased dose of combo pill and recheck in next few weeks. We can discuss different aches at that time as well. Return to the clinic or go to the nearest emergency room if any of your symptoms worsen or new symptoms occur.  I will check diabetes test today. Can discuss that further next visit as well.   I will check testosterone level for decreased drive, but please return to discuss those symptoms further once we have level back.   150 minutes exercise per week minimum.  We can discuss weight loss options further next visit, such as nutritionist or meeting with bariatric specialist.   Keeping you healthy  Get these tests  Blood pressure- Have your blood pressure checked once a year by your healthcare provider.  Normal blood pressure is 120/80  Weight- Have your body mass index (BMI) calculated to screen for obesity.  BMI is a measure of body fat based on height and weight. You can also calculate your own BMI at [email or web address].  Cholesterol- Have your cholesterol checked every year.  Diabetes- Have your blood sugar checked regularly if you have high blood pressure, high cholesterol, have a family history of diabetes or if you are overweight.  Screening for Colon Cancer- Colonoscopy starting at age 48.  Screening may begin sooner depending on your family history and other health conditions. Follow up colonoscopy as directed by your Gastroenterologist.  Screening for Prostate Cancer- Both blood work (PSA) and a rectal exam help screen for Prostate Cancer.  Screening begins at age 50 with African-American men and at age 28 with Caucasian men.  Screening may begin sooner depending on your family history.  Take these medicines  Aspirin- One aspirin daily can help prevent Heart disease and Stroke.  Flu shot- Every fall.  Tetanus- Every 10 years.  Zostavax- Once after the age of 42 to prevent Shingles.  Pneumonia shot- Once after the  age of 46; if you are younger than 61, ask your healthcare provider if you need a Pneumonia shot.  Take these steps  Don't smoke- If you do smoke, talk to your doctor about quitting.  For tips on how to quit, go to www.smokefree.gov or call 1-800-QUIT-NOW.  Be physically active- Exercise 5 days a week for at least 30 minutes.  If you are not already physically active start slow and gradually work up to 30 minutes of moderate physical activity.  Examples of moderate activity include walking briskly, mowing the yard, dancing, swimming, bicycling, etc.  Eat a healthy diet- Eat a variety of healthy food such as fruits, vegetables, low fat milk, low fat cheese, yogurt, lean meant, poultry, fish, beans, tofu, etc. For more information go to www.thenutritionsource.org  Drink alcohol in moderation- Limit alcohol intake to less than two drinks a day. Never drink and drive.  Dentist- Brush and floss twice daily; visit your dentist twice a year.  Depression- Your emotional health is as important as your physical health. If you're feeling down, or losing interest in things you would normally enjoy please talk to your healthcare provider.  Eye exam- Visit your eye doctor every year.  Safe sex- If you may be exposed to a sexually transmitted infection, use a condom.  Seat belts- Seat belts can save your life; always wear one.  Smoke/Carbon Monoxide detectors- These detectors need to be installed on the appropriate level of your home.  Replace batteries at least once a year.  Skin cancer- When out in the  sun, cover up and use sunscreen 15 SPF or higher.  Violence- If anyone is threatening you, please tell your healthcare provider.  Living Will/ Health care power of attorney- Speak with your healthcare provider and family.   IF you received an x-ray today, you will receive an invoice from [facility]. Please contact [facility] at [phone] with questions or concerns regarding  your invoice.   IF you received labwork today, you will receive an invoice from [place]. Please contact [facility] at [phone] with questions or concerns regarding your invoice.   Our billing staff will not be able to assist you with questions regarding bills from these companies.  You will be contacted with the lab results as soon as they are available. The fastest way to get your results is to activate your My Chart account. Instructions are located on the last page of this paperwork. If you have not heard from [place] regarding the results in 2 weeks, please contact this office.

## 2017-03-26 LAB — LIPID PANEL
CHOLESTEROL TOTAL: 175 mg/dL (ref 100–199)
Chol/HDL Ratio: 3.8 ratio (ref 0.0–5.0)
HDL: 46 mg/dL (ref >39)
LDL CALC: 108 mg/dL — AB (ref 0–99)
TRIGLYCERIDES: 104 mg/dL (ref 0–149)
VLDL CHOLESTEROL CAL: 21 mg/dL (ref 5–40)

## 2017-03-26 LAB — COMPREHENSIVE METABOLIC PANEL
ALK PHOS: 65 IU/L (ref 39–117)
ALT: 31 IU/L (ref 0–44)
AST: 23 IU/L (ref 0–40)
Albumin/Globulin Ratio: 1.2 (ref 1.2–2.2)
Albumin: 4.2 g/dL (ref 3.6–4.8)
BILIRUBIN TOTAL: 0.3 mg/dL (ref 0.0–1.2)
BUN/Creatinine Ratio: 18 (ref 10–24)
BUN: 17 mg/dL (ref 8–27)
CHLORIDE: 102 mmol/L (ref 96–106)
CO2: 19 mmol/L — AB (ref 20–29)
CREATININE: 0.93 mg/dL (ref 0.76–1.27)
Calcium: 9.6 mg/dL (ref 8.6–10.2)
GFR calc Af Amer: 103 mL/min/{1.73_m2} (ref >59)
GFR calc non Af Amer: 89 mL/min/{1.73_m2} (ref >59)
Globulin, Total: 3.4 g/dL (ref 1.5–4.5)
Glucose: 156 mg/dL — ABNORMAL HIGH (ref 65–99)
Potassium: 4.1 mmol/L (ref 3.5–5.2)
Sodium: 138 mmol/L (ref 134–144)
Total Protein: 7.6 g/dL (ref 6.0–8.5)

## 2017-03-26 LAB — TESTOSTERONE: TESTOSTERONE: 222 ng/dL — AB (ref 264–916)

## 2017-03-26 LAB — HEMOGLOBIN A1C
Est. average glucose Bld gHb Est-mCnc: 131 mg/dL
Hgb A1c MFr Bld: 6.2 % — ABNORMAL HIGH (ref 4.8–5.6)

## 2017-03-26 LAB — HEPATITIS C ANTIBODY: Hep C Virus Ab: <0.1 s/co ratio (ref 0.0–0.9)

## 2017-03-26 LAB — HIV ANTIBODY (ROUTINE TESTING W REFLEX): HIV Screen 4th Generation wRfx: NONREACTIVE

## 2017-03-26 LAB — PSA: Prostate Specific Ag, Serum: 0.8 ng/mL (ref 0.0–4.0)

## 2017-04-18 ENCOUNTER — Other Ambulatory Visit: Payer: Self-pay

## 2017-04-18 ENCOUNTER — Encounter: Payer: Self-pay | Admitting: Family Medicine

## 2017-04-18 ENCOUNTER — Ambulatory Visit: Payer: 59 | Admitting: Family Medicine

## 2017-04-18 VITALS — BP 132/74 | HR 80 | Temp 98.1°F | Resp 18 | Ht 71.85 in | Wt 341.0 lb

## 2017-04-18 DIAGNOSIS — M25562 Pain in left knee: Secondary | ICD-10-CM | POA: Diagnosis not present

## 2017-04-18 DIAGNOSIS — R7989 Other specified abnormal findings of blood chemistry: Secondary | ICD-10-CM

## 2017-04-18 DIAGNOSIS — G8929 Other chronic pain: Secondary | ICD-10-CM

## 2017-04-18 DIAGNOSIS — I10 Essential (primary) hypertension: Secondary | ICD-10-CM

## 2017-04-18 NOTE — Patient Instructions (Addendum)
I will recheck testosterone levels today. If still low, can meet with endocrinology.   Blood pressure looks better on new dose of meds. No more changes for now.   L knee pain may be related to some arthritis. Tylenol may be safer than nsaids. You can also try glucosamine over the counter to see if that helps.  If not improving in next 6 weeks or worse sooner - return for xrays and possible brace, or orthopaedic evaluation if needed.   Return to the clinic or go to the nearest emergency room if any of your symptoms worsen or new symptoms occur.     IF you received an x-ray today, you will receive an invoice from [facility]. Please contact [facility] at [phone] with questions or concerns regarding your invoice.   IF you received labwork today, you will receive an invoice from [place]. Please contact [facility] at [phone] with questions or concerns regarding your invoice.   Our billing staff will not be able to assist you with questions regarding bills from these companies.  You will be contacted with the lab results as soon as they are available. The fastest way to get your results is to activate your My Chart account. Instructions are located on the last page of this paperwork. If you have not heard from [place] regarding the results in 2 weeks, please contact this office.

## 2017-04-18 NOTE — Progress Notes (Signed)
Subjective:  By signing my name below, I, Essence Howell, attest that this documentation has been prepared under the direction and in the presence of Wendie Agreste, MD Electronically Signed: Ladene Artist, ED Scribe 04/18/2017 at 9:23 AM.   Patient ID: Zachary [name], male    DOB: Dec 20, 1955, 61 y.o.   MRN: [id number]  Chief Complaint  Patient presents with  . Follow-up    follow up on medication    HPI Zachary [name] is a 61 y.o. male who presents to Primary Care at [facility] for f/u. Recently seen 11/26 for CPE and other concerns addressed.  HTN Elevated at last OV. Changed Losartan-HCTZ to 100-12.5. Continued Toprol, Cardura and Norvasc at same doses. Also tachycardic at that time with pulse of 115. He checks his BP at home with readings of 117-130s/70s-80s. States he has been doing more walking up stairs. Denies light-headedness, dizziness, cp, sob, any other side-effects.  Low Testosterone Reading of 222 at last visit that was drawn at 9:20 AM. He was having decreased libido which started at age 71 and some difficulty with erections when discussed last visit. Pt is in a monogamous relationship. Denies change in relationship or at work.  Various Arthralgias Thought to be arthritis. Reports L knee pain and stiffness in the morning s/p surgery while in college. Pt reports increased pain with ambulating but his knee does not lock or give away. He takes Aspirin and ibuprofen, has not tried Tylenol. He has tried glucosamine several years ago. Unknown relief.   Patient Active Problem List   Diagnosis Date Noted  . OBESITY 05/09/2009  . Essential hypertension 05/09/2009  . HEMOCCULT POSITIVE STOOL 05/09/2009  . HEART MURMUR, HX OF 05/09/2009   Past Medical History:  Diagnosis Date  . Allergy   . Arthritis   . Asthma   . Hypertension    Past Surgical History:  Procedure Laterality Date  . APPENDECTOMY     Allergies  Allergen Reactions  . Penicillins Rash   Prior to  Admission medications   Medication Sig Start Date End Date Taking? Authorizing Provider  amLODipine (NORVASC) 10 MG tablet Take 1 tablet (10 mg total) by mouth daily. 03/25/17   Wendie Agreste, MD  doxazosin (CARDURA) 4 MG tablet Take 1 tablet (4 mg total) by mouth daily. 03/25/17   Wendie Agreste, MD  losartan-hydrochlorothiazide (HYZAAR) 100-12.5 MG tablet Take 1 tablet by mouth daily. 03/25/17   Wendie Agreste, MD  metoprolol succinate (TOPROL-XL) 100 MG 24 hr tablet Take 1 tablet (100 mg total) by mouth daily. 03/25/17   Wendie Agreste, MD   Social History   Socioeconomic History  . Marital status: Married    Spouse name: Not on file  . Number of children: Not on file  . Years of education: Not on file  . Highest education level: Not on file  Social Needs  . Financial resource strain: Not on file  . Food insecurity - worry: Not on file  . Food insecurity - inability: Not on file  . Transportation needs - medical: Not on file  . Transportation needs - non-medical: Not on file  Occupational History  . Not on file  Tobacco Use  . Smoking status: Former Smoker    Last attempt to quit: 02/09/1979    Years since quitting: 38.2  . Smokeless tobacco: Never Used  Substance and Sexual Activity  . Alcohol use: No    Alcohol/week: 0.0 oz  . Drug use: No  .  Sexual activity: Yes  Other Topics Concern  . Not on file  Social History Narrative  . Not on file   Review of Systems  Constitutional: Negative for fatigue and unexpected weight change.  Eyes: Negative for visual disturbance.  Respiratory: Negative for cough, chest tightness and shortness of breath.   Cardiovascular: Negative for chest pain, palpitations and leg swelling.  Gastrointestinal: Negative for abdominal pain and blood in stool.  Musculoskeletal: Positive for arthralgias.  Neurological: Negative for dizziness, light-headedness and headaches.      Objective:   Physical Exam  Constitutional: He is  oriented to person, place, and time. He appears well-developed and well-nourished.  HENT:  Head: Normocephalic and atraumatic.  Eyes: EOM are normal. Pupils are equal, round, and reactive to light.  Neck: No JVD present. Carotid bruit is not present.  Cardiovascular: Normal rate, regular rhythm and normal heart sounds.  No murmur heard. Pulmonary/Chest: Effort normal and breath sounds normal. He has no rales.  Musculoskeletal: He exhibits edema.  Trace non-pitting pedal edema. L knee: FROM. Skin intact. No significant effusion. Well healed scar over medial and lateral knee. No focal bony tenderness. Neg McMurray's. Neg Lachman.  Neurological: He is alert and oriented to person, place, and time.  Skin: Skin is warm and dry.  Psychiatric: He has a normal mood and affect.  Vitals reviewed.  Vitals:   04/18/17 0914  BP: 132/74  Pulse: 80  Resp: 18  Temp: 98.1 F (36.7 C)  TempSrc: Oral  SpO2: 95%  Weight: (!) 341 lb (154.7 kg)  Height: 5' 11.85" (1.825 m)      Assessment & Plan:  Zachary [name] is a 61 y.o. male Low testosterone in male - Plan: Testosterone, Free, Total, SHBG  - With associated decreased libido, erectile dysfunction.  -Slightly low on previous testing, repeat testing as above, then endocrinology eval if persistently low.  -Continue walking/exercise for weight loss to also help with blood pressure control and glycemic control.  Chronic pain of left knee  -Remote history of surgery, suspected osteoarthritis based on symptoms. Initial trial of Tylenol in preference over NSAIDs, trial of glucosamine, recheck in 6 weeks if not improving for x-rays and possible brace versus Ortho eval that time. Sooner if worse  Essential hypertension  -Improved on changed dose of losartan HCTZ. Continue same, with Norvasc and Toprol at same doses, as well as continuing Cardura 4 mg daily.   No orders of the defined types were placed in this encounter.  Patient Instructions   I  will recheck testosterone levels today. If still low, can meet with endocrinology.   Blood pressure looks better on new dose of meds. No more changes for now.   L knee pain may be related to some arthritis. Tylenol may be safer than nsaids. You can also try glucosamine over the counter to see if that helps.  If not improving in next 6 weeks or worse sooner - return for xrays and possible brace, or orthopaedic evaluation if needed.   Return to the clinic or go to the nearest emergency room if any of your symptoms worsen or new symptoms occur.     IF you received an x-ray today, you will receive an invoice from [facility]. Please contact [facility] at [phone] with questions or concerns regarding your invoice.   IF you received labwork today, you will receive an invoice from [place]. Please contact [facility] at [phone] with questions or concerns regarding your invoice.   Our billing staff  will not be able to assist you with questions regarding bills from these companies.  You will be contacted with the lab results as soon as they are available. The fastest way to get your results is to activate your My Chart account. Instructions are located on the last page of this paperwork. If you have not heard from [place] regarding the results in 2 weeks, please contact this office.     I personally performed the services described in this documentation, which was scribed in my presence. The recorded information has been reviewed and considered for accuracy and completeness, addended by me as needed, and agree with information above.  Signed,   Merri Ray, MD [facility] [place].  04/18/17 9:40 AM

## 2017-04-19 LAB — TESTOSTERONE, FREE, TOTAL, SHBG
SEX HORMONE BINDING: 64.2 nmol/L (ref 19.3–76.4)
TESTOSTERONE FREE: 7.4 pg/mL (ref 6.6–18.1)
Testosterone: 407 ng/dL (ref 264–916)

## 2017-04-24 ENCOUNTER — Encounter: Payer: Self-pay | Admitting: Family Medicine

## 2017-10-04 ENCOUNTER — Encounter: Payer: Self-pay | Admitting: Emergency Medicine

## 2017-10-04 ENCOUNTER — Other Ambulatory Visit: Payer: Self-pay

## 2017-10-04 ENCOUNTER — Ambulatory Visit: Payer: 59 | Admitting: Emergency Medicine

## 2017-10-04 VITALS — BP 142/70 | HR 83 | Temp 98.5°F | Resp 16 | Ht 71.0 in | Wt 280.0 lb

## 2017-10-04 DIAGNOSIS — R7303 Prediabetes: Secondary | ICD-10-CM

## 2017-10-04 DIAGNOSIS — I10 Essential (primary) hypertension: Secondary | ICD-10-CM | POA: Diagnosis not present

## 2017-10-04 DIAGNOSIS — R7989 Other specified abnormal findings of blood chemistry: Secondary | ICD-10-CM | POA: Diagnosis not present

## 2017-10-04 MED ORDER — METOPROLOL SUCCINATE ER 100 MG PO TB24: 100.0000 mg | ORAL_TABLET | Freq: Every day | ORAL | 1 refills | Status: DC

## 2017-10-04 MED ORDER — AMLODIPINE BESYLATE 10 MG PO TABS: 10.0000 mg | ORAL_TABLET | Freq: Every day | ORAL | 1 refills | Status: DC

## 2017-10-04 MED ORDER — LOSARTAN POTASSIUM-HCTZ 100-12.5 MG PO TABS: 1.0000 | ORAL_TABLET | Freq: Every day | ORAL | 1 refills | Status: DC

## 2017-10-04 MED ORDER — DOXAZOSIN MESYLATE 4 MG PO TABS: 4.0000 mg | ORAL_TABLET | Freq: Every day | ORAL | 1 refills | Status: DC

## 2017-10-04 NOTE — Patient Instructions (Addendum)
   IF you received an x-ray today, you will receive an invoice from [facility]. Please contact [facility] at [phone] with questions or concerns regarding your invoice.   IF you received labwork today, you will receive an invoice from [facility]. Please contact [facility] at [phone] with questions or concerns regarding your invoice.   Our billing staff will not be able to assist you with questions regarding bills from these companies.  You will be contacted with the lab results as soon as they are available. The fastest way to get your results is to activate your My Chart account. Instructions are located on the last page of this paperwork. If you have not heard from us regarding the results in 2 weeks, please contact this office.     Hypertension Hypertension, commonly called high blood pressure, is when the force of blood pumping through the arteries is too strong. The arteries are the blood vessels that carry blood from the heart throughout the body. Hypertension forces the heart to work harder to pump blood and may cause arteries to become narrow or stiff. Having untreated or uncontrolled hypertension can cause heart attacks, strokes, kidney disease, and other problems. A blood pressure reading consists of a higher number over a lower number. Ideally, your blood pressure should be below 120/80. The first ("top") number is called the systolic pressure. It is a measure of the pressure in your arteries as your heart beats. The second ("bottom") number is called the diastolic pressure. It is a measure of the pressure in your arteries as the heart relaxes. What are the causes? The cause of this condition is not known. What increases the risk? Some risk factors for high blood pressure are under your control. Others are not. Factors you can change  Smoking.  Having type 2 diabetes mellitus, high cholesterol, or both.  Not getting enough exercise or physical  activity.  Being overweight.  Having too much fat, sugar, calories, or salt (sodium) in your diet.  Drinking too much alcohol. Factors that are difficult or impossible to change  Having chronic kidney disease.  Having a family history of high blood pressure.  Age. Risk increases with age.  Race. You may be at higher risk if you are African-American.  Gender. Men are at higher risk than women before age 45. After age 65, women are at higher risk than men.  Having obstructive sleep apnea.  Stress. What are the signs or symptoms? Extremely high blood pressure (hypertensive crisis) may cause:  Headache.  Anxiety.  Shortness of breath.  Nosebleed.  Nausea and vomiting.  Severe chest pain.  Jerky movements you cannot control (seizures).  How is this diagnosed? This condition is diagnosed by measuring your blood pressure while you are seated, with your arm resting on a surface. The cuff of the blood pressure monitor will be placed directly against the skin of your upper arm at the level of your heart. It should be measured at least twice using the same arm. Certain conditions can cause a difference in blood pressure between your right and left arms. Certain factors can cause blood pressure readings to be lower or higher than normal (elevated) for a short period of time:  When your blood pressure is higher when you are in a health care provider's office than when you are at home, this is called white coat hypertension. Most people with this condition do not need medicines.  When your blood pressure is higher at home than when you   are in a health care provider's office, this is called masked hypertension. Most people with this condition may need medicines to control blood pressure.  If you have a high blood pressure reading during one visit or you have normal blood pressure with other risk factors:  You may be asked to return on a different day to have your blood pressure  checked again.  You may be asked to monitor your blood pressure at home for 1 week or longer.  If you are diagnosed with hypertension, you may have other blood or imaging tests to help your health care provider understand your overall risk for other conditions. How is this treated? This condition is treated by making healthy lifestyle changes, such as eating healthy foods, exercising more, and reducing your alcohol intake. Your health care provider may prescribe medicine if lifestyle changes are not enough to get your blood pressure under control, and if:  Your systolic blood pressure is above 130.  Your diastolic blood pressure is above 80.  Your personal target blood pressure may vary depending on your medical conditions, your age, and other factors. Follow these instructions at home: Eating and drinking  Eat a diet that is high in fiber and potassium, and low in sodium, added sugar, and fat. An example eating plan is called the DASH (Dietary Approaches to Stop Hypertension) diet. To eat this way: ? Eat plenty of fresh fruits and vegetables. Try to fill half of your plate at each meal with fruits and vegetables. ? Eat whole grains, such as whole wheat pasta, brown rice, or whole grain bread. Fill about one quarter of your plate with whole grains. ? Eat or drink low-fat dairy products, such as skim milk or low-fat yogurt. ? Avoid fatty cuts of meat, processed or cured meats, and poultry with skin. Fill about one quarter of your plate with lean proteins, such as fish, chicken without skin, beans, eggs, and tofu. ? Avoid premade and processed foods. These tend to be higher in sodium, added sugar, and fat.  Reduce your daily sodium intake. Most people with hypertension should eat less than 1,500 mg of sodium a day.  Limit alcohol intake to no more than 1 drink a day for nonpregnant women and 2 drinks a day for men. One drink equals 12 oz of beer, 5 oz of wine, or 1 oz of hard  liquor. Lifestyle  Work with your health care provider to maintain a healthy body weight or to lose weight. Ask what an ideal weight is for you.  Get at least 30 minutes of exercise that causes your heart to beat faster (aerobic exercise) most days of the week. Activities may include walking, swimming, or biking.  Include exercise to strengthen your muscles (resistance exercise), such as pilates or lifting weights, as part of your weekly exercise routine. Try to do these types of exercises for 30 minutes at least 3 days a week.  Do not use any products that contain nicotine or tobacco, such as cigarettes and e-cigarettes. If you need help quitting, ask your health care provider.  Monitor your blood pressure at home as told by your health care provider.  Keep all follow-up visits as told by your health care provider. This is important. Medicines  Take over-the-counter and prescription medicines only as told by your health care provider. Follow directions carefully. Blood pressure medicines must be taken as prescribed.  Do not skip doses of blood pressure medicine. Doing this puts you at risk for problems and   can make the medicine less effective.  Ask your health care provider about side effects or reactions to medicines that you should watch for. Contact a health care provider if:  You think you are having a reaction to a medicine you are taking.  You have headaches that keep coming back (recurring).  You feel dizzy.  You have swelling in your ankles.  You have trouble with your vision. Get help right away if:  You develop a severe headache or confusion.  You have unusual weakness or numbness.  You feel faint.  You have severe pain in your chest or abdomen.  You vomit repeatedly.  You have trouble breathing. Summary  Hypertension is when the force of blood pumping through your arteries is too strong. If this condition is not controlled, it may put you at risk for serious  complications.  Your personal target blood pressure may vary depending on your medical conditions, your age, and other factors. For most people, a normal blood pressure is less than 120/80.  Hypertension is treated with lifestyle changes, medicines, or a combination of both. Lifestyle changes include weight loss, eating a healthy, low-sodium diet, exercising more, and limiting alcohol. This information is not intended to replace advice given to you by your health care provider. Make sure you discuss any questions you have with your health care provider. Document Released: 04/16/2005 Document Revised: 03/14/2016 Document Reviewed: 03/14/2016 [employer] Interactive Patient Education  2018 [employer]  

## 2017-10-04 NOTE — Progress Notes (Signed)
Zachary [name] 62 y.o.   Chief Complaint  Patient presents with  . Medication Refill    HISTORY OF PRESENT ILLNESS: This is a 62 y.o. male with history of hypertension here for medication refill.  Also requesting blood work.  Has a history of low testosterone.  Doing well without any particular complaints.  HPI   Prior to Admission medications   Medication Sig Start Date End Date Taking? Authorizing Provider  amLODipine (NORVASC) 10 MG tablet Take 1 tablet (10 mg total) by mouth daily. 03/25/17  Yes Zachary Agreste, MD  doxazosin (CARDURA) 4 MG tablet Take 1 tablet (4 mg total) by mouth daily. 03/25/17  Yes Zachary Agreste, MD  losartan-hydrochlorothiazide (HYZAAR) 100-12.5 MG tablet Take 1 tablet by mouth daily. 03/25/17  Yes Zachary Agreste, MD  metoprolol succinate (TOPROL-XL) 100 MG 24 hr tablet Take 1 tablet (100 mg total) by mouth daily. 03/25/17  Yes Zachary Agreste, MD    Allergies  Allergen Reactions  . Penicillins Rash    Patient Active Problem List   Diagnosis Date Noted  . OBESITY 05/09/2009  . Essential hypertension 05/09/2009  . HEMOCCULT POSITIVE STOOL 05/09/2009  . HEART MURMUR, HX OF 05/09/2009    Past Medical History:  Diagnosis Date  . Allergy   . Arthritis   . Asthma   . Hypertension     Past Surgical History:  Procedure Laterality Date  . APPENDECTOMY      Social History   Socioeconomic History  . Marital status: Married    Spouse name: Not on file  . Number of children: Not on file  . Years of education: Not on file  . Highest education level: Not on file  Occupational History  . Not on file  Social Needs  . Financial resource strain: Not on file  . Food insecurity:    Worry: Not on file    Inability: Not on file  . Transportation needs:    Medical: Not on file    Non-medical: Not on file  Tobacco Use  . Smoking status: Former Smoker    Last attempt to quit: 02/09/1979    Years since quitting: 38.6  . Smokeless  tobacco: Never Used  Substance and Sexual Activity  . Alcohol use: No    Alcohol/week: 0.0 oz  . Drug use: No  . Sexual activity: Yes  Lifestyle  . Physical activity:    Days per week: Not on file    Minutes per session: Not on file  . Stress: Not on file  Relationships  . Social connections:    Talks on phone: Not on file    Gets together: Not on file    Attends religious service: Not on file    Active member of club or organization: Not on file    Attends meetings of clubs or organizations: Not on file    Relationship status: Not on file  . Intimate partner violence:    Fear of current or ex partner: Not on file    Emotionally abused: Not on file    Physically abused: Not on file    Forced sexual activity: Not on file  Other Topics Concern  . Not on file  Social History Narrative  . Not on file    Family History  Problem Relation Age of Onset  . Hypertension Mother   . Stroke Father      Review of Systems  Constitutional: Positive for weight loss. Negative for chills and fever.  HENT: Negative.  Negative for hearing loss and sore throat.   Eyes: Negative.  Negative for blurred vision and double vision.  Respiratory: Negative.  Negative for cough and shortness of breath.   Cardiovascular: Negative.  Negative for chest pain, palpitations and leg swelling.  Gastrointestinal: Negative.  Negative for abdominal pain, nausea and vomiting.  Genitourinary: Negative.  Negative for dysuria.  Musculoskeletal: Positive for back pain and joint pain.  Skin: Negative for rash.  Neurological: Negative.  Negative for dizziness and headaches.  Endo/Heme/Allergies: Negative.   All other systems reviewed and are negative.  Vitals:   10/04/17 0813  BP: (!) 142/70  Pulse: 83  Resp: 16  Temp: 98.5 F (36.9 C)  SpO2: 98%     Physical Exam  Constitutional: He is oriented to person, place, and time. He appears well-developed and well-nourished.  HENT:  Head: Normocephalic and  atraumatic.  Nose: Nose normal.  Mouth/Throat: Oropharynx is clear and moist.  Eyes: Pupils are equal, round, and reactive to light. Conjunctivae and EOM are normal.  Neck: Normal range of motion. Neck supple. No thyromegaly present.  Cardiovascular: Normal rate, regular rhythm and normal heart sounds.  Pulmonary/Chest: Effort normal and breath sounds normal.  Abdominal: Soft. He exhibits no distension. There is no tenderness.  Musculoskeletal: Normal range of motion.  Lymphadenopathy:    He has no cervical adenopathy.  Neurological: He is alert and oriented to person, place, and time. No sensory deficit. He exhibits normal muscle tone.  Skin: Skin is warm and dry. Capillary refill takes less than 2 seconds.  Psychiatric: He has a normal mood and affect. His behavior is normal.  Vitals reviewed.  A total of 25 minutes was spent in the room with the patient, greater than 50% of which was in counseling/coordination of care regarding hypertension, management, medications, need for follow-up.   ASSESSMENT & PLAN: Zachary [name] was seen today for medication refill.  Diagnoses and all orders for this visit:  Essential hypertension -     CBC with Differential/Platelet -     Comprehensive metabolic panel -     metoprolol succinate (TOPROL-XL) 100 MG 24 hr tablet; Take 1 tablet (100 mg total) by mouth daily. -     losartan-hydrochlorothiazide (HYZAAR) 100-12.5 MG tablet; Take 1 tablet by mouth daily. -     doxazosin (CARDURA) 4 MG tablet; Take 1 tablet (4 mg total) by mouth daily. -     amLODipine (NORVASC) 10 MG tablet; Take 1 tablet (10 mg total) by mouth daily.  Low testosterone in male -     TestT+TestF+SHBG  Prediabetes -     Lipid panel -     Hemoglobin A1c    Patient Instructions       IF you received an x-ray today, you will receive an invoice from [facility]. Please contact [facility] at [phone] with questions or concerns regarding your invoice.   IF  you received labwork today, you will receive an invoice from [place]. Please contact [facility] at [phone] with questions or concerns regarding your invoice.   Our billing staff will not be able to assist you with questions regarding bills from these companies.  You will be contacted with the lab results as soon as they are available. The fastest way to get your results is to activate your My Chart account. Instructions are located on the last page of this paperwork. If you have not heard from [place] regarding the results in 2 weeks, please contact this office.  Hypertension Hypertension, commonly called high blood pressure, is when the force of blood pumping through the arteries is too strong. The arteries are the blood vessels that carry blood from the heart throughout the body. Hypertension forces the heart to work harder to pump blood and may cause arteries to become narrow or stiff. Having untreated or uncontrolled hypertension can cause heart attacks, strokes, kidney disease, and other problems. A blood pressure reading consists of a higher number over a lower number. Ideally, your blood pressure should be below 120/80. The first ("top") number is called the systolic pressure. It is a measure of the pressure in your arteries as your heart beats. The second ("bottom") number is called the diastolic pressure. It is a measure of the pressure in your arteries as the heart relaxes. What are the causes? The cause of this condition is not known. What increases the risk? Some risk factors for high blood pressure are under your control. Others are not. Factors you can change  Smoking.  Having type 2 diabetes mellitus, high cholesterol, or both.  Not getting enough exercise or physical activity.  Being overweight.  Having too much fat, sugar, calories, or salt (sodium) in your diet.  Drinking too much alcohol. Factors that are difficult or impossible to change  Having chronic kidney  disease.  Having a family history of high blood pressure.  Age. Risk increases with age.  Race. You may be at higher risk if you are African-American.  Gender. Men are at higher risk than women before age 56. After age 55, women are at higher risk than men.  Having obstructive sleep apnea.  Stress. What are the signs or symptoms? Extremely high blood pressure (hypertensive crisis) may cause:  Headache.  Anxiety.  Shortness of breath.  Nosebleed.  Nausea and vomiting.  Severe chest pain.  Jerky movements you cannot control (seizures).  How is this diagnosed? This condition is diagnosed by measuring your blood pressure while you are seated, with your arm resting on a surface. The cuff of the blood pressure monitor will be placed directly against the skin of your upper arm at the level of your heart. It should be measured at least twice using the same arm. Certain conditions can cause a difference in blood pressure between your right and left arms. Certain factors can cause blood pressure readings to be lower or higher than normal (elevated) for a short period of time:  When your blood pressure is higher when you are in a health care provider's office than when you are at home, this is called white coat hypertension. Most people with this condition do not need medicines.  When your blood pressure is higher at home than when you are in a health care provider's office, this is called masked hypertension. Most people with this condition may need medicines to control blood pressure.  If you have a high blood pressure reading during one visit or you have normal blood pressure with other risk factors:  You may be asked to return on a different day to have your blood pressure checked again.  You may be asked to monitor your blood pressure at home for 1 week or longer.  If you are diagnosed with hypertension, you may have other blood or imaging tests to help your health care provider  understand your overall risk for other conditions. How is this treated? This condition is treated by making healthy lifestyle changes, such as eating healthy foods, exercising more, and reducing your alcohol intake.  Your health care provider may prescribe medicine if lifestyle changes are not enough to get your blood pressure under control, and if:  Your systolic blood pressure is above 130.  Your diastolic blood pressure is above 80.  Your personal target blood pressure may vary depending on your medical conditions, your age, and other factors. Follow these instructions at home: Eating and drinking  Eat a diet that is high in fiber and potassium, and low in sodium, added sugar, and fat. An example eating plan is called the DASH (Dietary Approaches to Stop Hypertension) diet. To eat this way: ? Eat plenty of fresh fruits and vegetables. Try to fill half of your plate at each meal with fruits and vegetables. ? Eat whole grains, such as whole wheat pasta, brown rice, or whole grain bread. Fill about one quarter of your plate with whole grains. ? Eat or drink low-fat dairy products, such as skim milk or low-fat yogurt. ? Avoid fatty cuts of meat, processed or cured meats, and poultry with skin. Fill about one quarter of your plate with lean proteins, such as fish, chicken without skin, beans, eggs, and tofu. ? Avoid premade and processed foods. These tend to be higher in sodium, added sugar, and fat.  Reduce your daily sodium intake. Most people with hypertension should eat less than 1,500 mg of sodium a day.  Limit alcohol intake to no more than 1 drink a day for nonpregnant women and 2 drinks a day for men. One drink equals 12 oz of beer, 5 oz of wine, or 1 oz of hard liquor. Lifestyle  Work with your health care provider to maintain a healthy body weight or to lose weight. Ask what an ideal weight is for you.  Get at least 30 minutes of exercise that causes your heart to beat faster  (aerobic exercise) most days of the week. Activities may include walking, swimming, or biking.  Include exercise to strengthen your muscles (resistance exercise), such as pilates or lifting weights, as part of your weekly exercise routine. Try to do these types of exercises for 30 minutes at least 3 days a week.  Do not use any products that contain nicotine or tobacco, such as cigarettes and e-cigarettes. If you need help quitting, ask your health care provider.  Monitor your blood pressure at home as told by your health care provider.  Keep all follow-up visits as told by your health care provider. This is important. Medicines  Take over-the-counter and prescription medicines only as told by your health care provider. Follow directions carefully. Blood pressure medicines must be taken as prescribed.  Do not skip doses of blood pressure medicine. Doing this puts you at risk for problems and can make the medicine less effective.  Ask your health care provider about side effects or reactions to medicines that you should watch for. Contact a health care provider if:  You think you are having a reaction to a medicine you are taking.  You have headaches that keep coming back (recurring).  You feel dizzy.  You have swelling in your ankles.  You have trouble with your vision. Get help right away if:  You develop a severe headache or confusion.  You have unusual weakness or numbness.  You feel faint.  You have severe pain in your chest or abdomen.  You vomit repeatedly.  You have trouble breathing. Summary  Hypertension is when the force of blood pumping through your arteries is too strong. If this condition is not  controlled, it may put you at risk for serious complications.  Your personal target blood pressure may vary depending on your medical conditions, your age, and other factors. For most people, a normal blood pressure is less than 120/80.  Hypertension is treated with  lifestyle changes, medicines, or a combination of both. Lifestyle changes include weight loss, eating a healthy, low-sodium diet, exercising more, and limiting alcohol. This information is not intended to replace advice given to you by your health care provider. Make sure you discuss any questions you have with your health care provider. Document Released: 04/16/2005 Document Revised: 03/14/2016 Document Reviewed: 03/14/2016 Elsevier Interactive Patient Education  2018 Elsevier Inc.      Agustina Caroli, MD [facility]

## 2017-10-07 ENCOUNTER — Encounter: Payer: Self-pay | Admitting: Emergency Medicine

## 2017-10-07 ENCOUNTER — Other Ambulatory Visit: Payer: Self-pay | Admitting: Emergency Medicine

## 2017-10-07 DIAGNOSIS — R7989 Other specified abnormal findings of blood chemistry: Secondary | ICD-10-CM

## 2017-10-08 LAB — COMPREHENSIVE METABOLIC PANEL
ALBUMIN: 4.2 g/dL (ref 3.6–4.8)
ALK PHOS: 61 IU/L (ref 39–117)
ALT: 24 IU/L (ref 0–44)
AST: 24 IU/L (ref 0–40)
Albumin/Globulin Ratio: 1.4 (ref 1.2–2.2)
BUN/Creatinine Ratio: 14 (ref 10–24)
BUN: 12 mg/dL (ref 8–27)
Bilirubin Total: 0.6 mg/dL (ref 0.0–1.2)
CALCIUM: 9.3 mg/dL (ref 8.6–10.2)
CO2: 18 mmol/L — AB (ref 20–29)
CREATININE: 0.87 mg/dL (ref 0.76–1.27)
Chloride: 104 mmol/L (ref 96–106)
GFR calc Af Amer: 108 mL/min/{1.73_m2} (ref >59)
GFR calc non Af Amer: 93 mL/min/{1.73_m2} (ref >59)
Globulin, Total: 3.1 g/dL (ref 1.5–4.5)
Glucose: 91 mg/dL (ref 65–99)
Potassium: 3.9 mmol/L (ref 3.5–5.2)
Sodium: 140 mmol/L (ref 134–144)
Total Protein: 7.3 g/dL (ref 6.0–8.5)

## 2017-10-08 LAB — HEMOGLOBIN A1C
ESTIMATED AVERAGE GLUCOSE: 114 mg/dL
HEMOGLOBIN A1C: 5.6 % (ref 4.8–5.6)

## 2017-10-08 LAB — LIPID PANEL
CHOLESTEROL TOTAL: 154 mg/dL (ref 100–199)
Chol/HDL Ratio: 2.8 ratio (ref 0.0–5.0)
HDL: 55 mg/dL (ref >39)
LDL CALC: 93 mg/dL (ref 0–99)
Triglycerides: 28 mg/dL (ref 0–149)
VLDL Cholesterol Cal: 6 mg/dL (ref 5–40)

## 2017-10-08 LAB — TESTT+TESTF+SHBG
Sex Hormone Binding: 70 nmol/L (ref 19.3–76.4)
TESTOSTERONE, TOTAL: 430.5 ng/dL (ref 264.0–916.0)
Testosterone, Free: 4.8 pg/mL — ABNORMAL LOW (ref 6.6–18.1)

## 2017-10-08 LAB — CBC WITH DIFFERENTIAL/PLATELET
BASOS ABS: 0 10*3/uL (ref 0.0–0.2)
Basos: 0 %
EOS (ABSOLUTE): 0.1 10*3/uL (ref 0.0–0.4)
Eos: 2 %
HEMOGLOBIN: 13.4 g/dL (ref 13.0–17.7)
Hematocrit: 39.7 % (ref 37.5–51.0)
IMMATURE GRANULOCYTES: 0 %
Immature Grans (Abs): 0 10*3/uL (ref 0.0–0.1)
LYMPHS ABS: 1.5 10*3/uL (ref 0.7–3.1)
Lymphs: 25 %
MCH: 28.8 pg (ref 26.6–33.0)
MCHC: 33.8 g/dL (ref 31.5–35.7)
MCV: 85 fL (ref 79–97)
MONOCYTES: 11 %
Monocytes Absolute: 0.7 10*3/uL (ref 0.1–0.9)
Neutrophils Absolute: 3.9 10*3/uL (ref 1.4–7.0)
Neutrophils: 62 %
Platelets: 185 10*3/uL (ref 150–450)
RBC: 4.65 x10E6/uL (ref 4.14–5.80)
RDW: 15.3 % (ref 12.3–15.4)
WBC: 6.2 10*3/uL (ref 3.4–10.8)

## 2017-10-28 DIAGNOSIS — E291 Testicular hypofunction: Secondary | ICD-10-CM | POA: Diagnosis not present

## 2017-10-28 DIAGNOSIS — E782 Mixed hyperlipidemia: Secondary | ICD-10-CM | POA: Diagnosis not present

## 2017-10-28 DIAGNOSIS — R6882 Decreased libido: Secondary | ICD-10-CM | POA: Diagnosis not present

## 2018-01-06 ENCOUNTER — Ambulatory Visit: Payer: 59 | Admitting: Family Medicine

## 2018-01-20 ENCOUNTER — Other Ambulatory Visit: Payer: Self-pay

## 2018-01-20 ENCOUNTER — Ambulatory Visit: Payer: 59 | Admitting: Family Medicine

## 2018-01-20 ENCOUNTER — Encounter: Payer: Self-pay | Admitting: Family Medicine

## 2018-01-20 VITALS — BP 136/68 | HR 74 | Temp 97.9°F | Ht 71.0 in | Wt 291.8 lb

## 2018-01-20 DIAGNOSIS — Z23 Encounter for immunization: Secondary | ICD-10-CM | POA: Diagnosis not present

## 2018-01-20 DIAGNOSIS — I10 Essential (primary) hypertension: Secondary | ICD-10-CM | POA: Diagnosis not present

## 2018-01-20 NOTE — Addendum Note (Signed)
Addended by: Kittie Plater, Kaysen Sefcik HUA on: 01/20/2018 09:05 AM   Modules accepted: Orders

## 2018-01-20 NOTE — Progress Notes (Signed)
I,Arielle J Pollard,acting as a scribe for Wendie Agreste, MD.,have documented all relevant documentation on the behalf of Wendie Agreste, MD,as directed by  Wendie Agreste, MD while in the presence of Wendie Agreste, MD.   Subjective:    Patient ID: Zachary [name], male    DOB: 06-Apr-1956, 62 y.o.   MRN: [id number]  Chief Complaint  Patient presents with  . Hypertension    3 m f/u     HPI Zachary [name] is a 62 y.o. male who presents to Primary Care at [facility]   HTN BP Readings from Last 3 Encounters:  01/20/18 136/68  10/04/17 (!) 142/70  04/18/17 132/74   Lab Results  Component Value Date   CREATININE 0.87 10/04/2017   BUN 12 10/04/2017   NA 140 10/04/2017   K 3.9 10/04/2017   CL 104 10/04/2017   CO2 18 (L) 10/04/2017    Lab Results  Component Value Date   CREATININE 0.87 10/04/2017   He continues on:  norvastin 10 mg qd  Cardura 4 mg qd losartin-hcl 100-12.5 mg qd Toprol-XL 100 mg qd  He denies any new side effects since his last visit. He uses an at home BP monitor and he has been getting reading of 140/70-80.    Weight/obesity: Wt Readings from Last 3 Encounters:  01/20/18 291 lb 12.8 oz (132.4 kg)  10/04/17 280 lb (127 kg)  04/18/17 (!) 341 lb (154.7 kg)    BMI Readings from Last 3 Encounters:  01/20/18 40.70 kg/m  10/04/17 39.05 kg/m  04/18/17 46.44 kg/m   He exercises 5 days per week. He eats a [employer] in the morning, but he seldomly eats fast food during the week. He tries to eat more fruits and vegetables. He is not taking testosterone. He is taking a cruise in November.    Patient Active Problem List   Diagnosis Date Noted  . OBESITY 05/09/2009  . Essential hypertension 05/09/2009  . HEMOCCULT POSITIVE STOOL 05/09/2009  . HEART MURMUR, HX OF 05/09/2009   Past Medical History:  Diagnosis Date  . Allergy   . Arthritis   . Asthma   . Hypertension    Past Surgical History:  Procedure Laterality Date  . APPENDECTOMY      Allergies  Allergen Reactions  . Penicillins Rash   Prior to Admission medications   Medication Sig Start Date End Date Taking? Authorizing Provider  amLODipine (NORVASC) 10 MG tablet Take 1 tablet (10 mg total) by mouth daily. 10/04/17   Horald Pollen, MD  doxazosin (CARDURA) 4 MG tablet Take 1 tablet (4 mg total) by mouth daily. 10/04/17   Horald Pollen, MD  losartan-hydrochlorothiazide [facility]) 100-12.5 MG tablet Take 1 tablet by mouth daily. 10/04/17   Horald Pollen, MD  metoprolol succinate (TOPROL-XL) 100 MG 24 hr tablet Take 1 tablet (100 mg total) by mouth daily. 10/04/17   Horald Pollen, MD   Social History   Socioeconomic History  . Marital status: Married    Spouse name: Not on file  . Number of children: Not on file  . Years of education: Not on file  . Highest education level: Not on file  Occupational History  . Not on file  Social Needs  . Financial resource strain: Not on file  . Food insecurity:    Worry: Not on file    Inability: Not on file  . Transportation needs:    Medical: Not on file    Non-medical:  Not on file  Tobacco Use  . Smoking status: Former Smoker    Last attempt to quit: 02/09/1979    Years since quitting: 38.9  . Smokeless tobacco: Never Used  Substance and Sexual Activity  . Alcohol use: No    Alcohol/week: 0.0 standard drinks  . Drug use: No  . Sexual activity: Yes  Lifestyle  . Physical activity:    Days per week: Not on file    Minutes per session: Not on file  . Stress: Not on file  Relationships  . Social connections:    Talks on phone: Not on file    Gets together: Not on file    Attends religious service: Not on file    Active member of club or organization: Not on file    Attends meetings of clubs or organizations: Not on file    Relationship status: Not on file  . Intimate partner violence:    Fear of current or ex partner: Not on file    Emotionally abused: Not on file    Physically  abused: Not on file    Forced sexual activity: Not on file  Other Topics Concern  . Not on file  Social History Narrative  . Not on file    Review of Systems  Constitutional: Negative for fatigue and unexpected weight change.  Eyes: Negative for visual disturbance.  Respiratory: Negative for cough, chest tightness and shortness of breath.   Cardiovascular: Negative for chest pain, palpitations and leg swelling.  Gastrointestinal: Negative for abdominal pain and blood in stool.  Neurological: Negative for dizziness, light-headedness and headaches.       Objective:   Physical Exam  Constitutional: He is oriented to person, place, and time. He appears well-developed and well-nourished.  HENT:  Head: Normocephalic and atraumatic.  Eyes: Pupils are equal, round, and reactive to light. EOM are normal.  Neck: No JVD present. Carotid bruit is not present.  Cardiovascular: Normal rate, regular rhythm and normal heart sounds.  No murmur heard. Pulmonary/Chest: Effort normal and breath sounds normal. He has no rales.  Musculoskeletal: He exhibits no edema.  Neurological: He is alert and oriented to person, place, and time.  Skin: Skin is warm and dry.  Psychiatric: He has a normal mood and affect.  Vitals reviewed.   Vitals:   01/20/18 0836 01/20/18 0838  BP: (!) 150/82 136/68  Pulse: 74   Temp: 97.9 F (36.6 C)   SpO2: 97%        Assessment & Plan:  Zachary [name] is a 62 y.o. male Essential hypertension  -Stable on recheck.  Expect this to continue to improve with weight loss.  Commended on his efforts from weight loss last year to this year.  Slight bump in weight since last visit, but will monitor with repeat testing in 3 months at a physical.  Phone number provided for gastroenterologist for him to schedule colonoscopy.  No med changes at this time.   No orders of the defined types were placed in this encounter.  Patient Instructions     Call [facility] to schedule  colonoscopy. [phone]  No change in med doses for now.  I expect those readings to improve as weight improves. Congrats on the weight loss and keep up the good work!  Recheck in 3 months for physical  If you have lab work done today you will be contacted with your lab results within the next 2 weeks.  If you have not heard from  [place] then please contact us. The fastest way to get your results is to register for My Chart.   IF you received an x-ray today, you will receive an invoice from [facility]iology. Please contact [facility] Radiology at [phone] with questions or concerns regarding your invoice.   IF you received labwork today, you will receive an invoice from [place]. Please contact [facility] at [phone] with questions or concerns regarding your invoice.   Our billing staff will not be able to assist you with questions regarding bills from these companies.  You will be contacted with the lab results as soon as they are available. The fastest way to get your results is to activate your My Chart account. Instructions are located on the last page of this paperwork. If you have not heard from [place] regarding the results in 2 weeks, please contact this office.       I personally performed the services described in this documentation, which was scribed in my presence. The recorded information has been reviewed and considered for accuracy and completeness, addended by me as needed, and agree with information above.  Signed,   Merri Ray, MD [facility].  01/20/18 8:59 AM

## 2018-01-20 NOTE — Patient Instructions (Addendum)
   Call [facility] to schedule colonoscopy. [phone]  No change in med doses for now.  I expect those readings to improve as weight improves. Congrats on the weight loss and keep up the good work!  Recheck in 3 months for physical  If you have lab work done today you will be contacted with your lab results within the next 2 weeks.  If you have not heard from [place] then please contact us. The fastest way to get your results is to register for My Chart.   IF you received an x-ray today, you will receive an invoice from [facility]. Please contact [facility] Radiology at [phone] with questions or concerns regarding your invoice.   IF you received labwork today, you will receive an invoice from [place]. Please contact [facility] at [phone] with questions or concerns regarding your invoice.   Our billing staff will not be able to assist you with questions regarding bills from these companies.  You will be contacted with the lab results as soon as they are available. The fastest way to get your results is to activate your My Chart account. Instructions are located on the last page of this paperwork. If you have not heard from [place] regarding the results in 2 weeks, please contact this office.

## 2018-04-10 ENCOUNTER — Other Ambulatory Visit: Payer: Self-pay | Admitting: Emergency Medicine

## 2018-04-10 DIAGNOSIS — I10 Essential (primary) hypertension: Secondary | ICD-10-CM

## 2018-04-11 NOTE — Telephone Encounter (Signed)
Requested Prescriptions  Pending Prescriptions Disp Refills  . losartan-hydrochlorothiazide (HYZAAR) 100-12.5 MG tablet [Pharmacy Med Name: LOSARTAN/HCT 100-12.5MG  TAB] 90 tablet 0    Sig: TAKE 1 TABLET BY MOUTH ONCE DAILY     Cardiovascular: ARB + Diuretic Combos Failed - 04/10/2018 10:36 AM      Failed - K in normal range and within 180 days    Potassium  Date Value Ref Range Status  10/04/2017 3.9 3.5 - 5.2 mmol/L Final    Comment:    Specimen received in contact with cells. No visible hemolysis present. However GLUC may be decreased and K increased. Clinical correlation indicated.          Failed - Na in normal range and within 180 days    Sodium  Date Value Ref Range Status  10/04/2017 140 134 - 144 mmol/L Final         Failed - Cr in normal range and within 180 days    Creat  Date Value Ref Range Status  04/08/2015 1.04 0.70 - 1.33 mg/dL Final   Creatinine, Ser  Date Value Ref Range Status  10/04/2017 0.87 0.76 - 1.27 mg/dL Final         Failed - Ca in normal range and within 180 days    Calcium  Date Value Ref Range Status  10/04/2017 9.3 8.6 - 10.2 mg/dL Final         Passed - Patient is not pregnant      Passed - Last BP in normal range    BP Readings from Last 1 Encounters:  01/20/18 136/68         Passed - Valid encounter within last 6 months    Recent Outpatient Visits          2 months ago Essential hypertension   [facility] [place], MD   6 months ago Essential hypertension   [facility], Ines Bloomer, MD   11 months ago Low testosterone in male   [facility], Ranell Patrick, MD   1 year ago Annual physical exam   [facility], Ranell Patrick, MD   2 years ago Essential hypertension   [facility], Carroll Sage, FNP      Future Appointments            In 1 month Carlota Raspberry, Ranell Patrick, MD [facility] at [place], [facility]

## 2018-04-29 ENCOUNTER — Other Ambulatory Visit: Payer: Self-pay | Admitting: Family Medicine

## 2018-04-29 DIAGNOSIS — I10 Essential (primary) hypertension: Secondary | ICD-10-CM

## 2018-04-29 MED ORDER — METOPROLOL SUCCINATE ER 100 MG PO TB24: 100.0000 mg | ORAL_TABLET | Freq: Every day | ORAL | 1 refills | Status: DC

## 2018-04-29 MED ORDER — DOXAZOSIN MESYLATE 4 MG PO TABS: 4.0000 mg | ORAL_TABLET | Freq: Every day | ORAL | 1 refills | Status: DC

## 2018-04-29 MED ORDER — AMLODIPINE BESYLATE 10 MG PO TABS: 10.0000 mg | ORAL_TABLET | Freq: Every day | ORAL | 1 refills | Status: DC

## 2018-04-29 NOTE — Telephone Encounter (Signed)
Copied from [place] [id number]. Topic: Quick Communication - Rx Refill/Question >> Apr 29, 2018 10:53 AM Waylan Rocher, Lumin L wrote: Medication: amLODipine (NORVASC) 10 MG tablet, doxazosin (CARDURA) 4 MG tablet, metoprolol succinate (TOPROL-XL) 100 MG 24 hr tablet  Has the patient contacted their pharmacy? Yes.   (Agent: If no, request that the patient contact the pharmacy for the refill.) (Agent: If yes, when and what did the pharmacy advise?) 0 refills  Preferred Pharmacy (with phone number or street name): [facility], [place] [address] Phone: [phone] Fax: [phone]  Agent: Please be advised that RX refills may take up to 3 business days. We ask that you follow-up with your pharmacy.

## 2018-05-13 ENCOUNTER — Encounter: Payer: 59 | Admitting: Family Medicine

## 2018-06-13 ENCOUNTER — Other Ambulatory Visit: Payer: Self-pay

## 2018-06-13 ENCOUNTER — Encounter: Payer: Self-pay | Admitting: Family Medicine

## 2018-06-13 ENCOUNTER — Ambulatory Visit (INDEPENDENT_AMBULATORY_CARE_PROVIDER_SITE_OTHER): Payer: 59 | Admitting: Family Medicine

## 2018-06-13 VITALS — BP 140/78 | HR 79 | Temp 98.3°F | Ht 71.0 in | Wt 312.2 lb

## 2018-06-13 DIAGNOSIS — R7989 Other specified abnormal findings of blood chemistry: Secondary | ICD-10-CM | POA: Diagnosis not present

## 2018-06-13 DIAGNOSIS — I10 Essential (primary) hypertension: Secondary | ICD-10-CM

## 2018-06-13 DIAGNOSIS — Z0000 Encounter for general adult medical examination without abnormal findings: Secondary | ICD-10-CM

## 2018-06-13 DIAGNOSIS — Z0001 Encounter for general adult medical examination with abnormal findings: Secondary | ICD-10-CM

## 2018-06-13 DIAGNOSIS — R7303 Prediabetes: Secondary | ICD-10-CM

## 2018-06-13 DIAGNOSIS — Z1211 Encounter for screening for malignant neoplasm of colon: Secondary | ICD-10-CM

## 2018-06-13 DIAGNOSIS — Z13220 Encounter for screening for lipoid disorders: Secondary | ICD-10-CM

## 2018-06-13 DIAGNOSIS — Z125 Encounter for screening for malignant neoplasm of prostate: Secondary | ICD-10-CM | POA: Diagnosis not present

## 2018-06-13 NOTE — Progress Notes (Signed)
Subjective:    Patient ID: Zachary [name], male    DOB: 1955/06/21, 63 y.o.   MRN: [id number]  HPI Zachary [name] is a 63 y.o. male Presents today for: Chief Complaint  Patient presents with  . Annual Exam    CPE   Hypertension: BP Readings from Last 3 Encounters:  06/13/18 140/78  01/20/18 136/68  10/04/17 (!) 142/70   Lab Results  Component Value Date   CREATININE 0.87 10/04/2017  Currently on amlodipine 10 mg daily, doxazosin 4 mg daily, losartan HCT 100/12.5 mg daily and metoprolol 100 mg daily.  No home bp readings. Feeling well, no new side effects. Not having chest pains, no dyspnea. Min edema in ankles in evening, resolves by morning.   Low testosterone: Most recent testing in June 2019 with total testosterone 430, free testosterone 4.8, SHBG 70.  Was seen by specialist - told was normal.  Obesity with prediabetes history.  Wt Readings from Last 3 Encounters:  06/13/18 (!) 312 lb 3.2 oz (141.6 kg)  01/20/18 291 lb 12.8 oz (132.4 kg)  10/04/17 280 lb (127 kg)   Body mass index is 43.54 kg/m. Unfortunately has had significant weight gain as above since September, 21 pound weight gain.  increased 11 pounds from June to September last year.  Last A1c of 5.6 in June 2019.  Has been exercising at gym 3 days per week - 1/5 hrs - same as last visit.  Went on cruise and then with holidays has gained weight. Thinks some may be muscle but overeating as well.  Max weight 360 years ago.  Fasting blood sugar -114 this morning.   Cancer screening: Due for colonoscopy - has not had.  PSA 0.8 in November 2018  Immunization History  Administered Date(s) Administered  . Influenza, Seasonal, Injecte, Preservative Fre 05/24/2012  . Influenza,inj,Quad PF,6+ Mos 02/21/2013, 03/20/2014, 01/20/2018  . Tdap 03/25/2017  no hx of shingles vaccine.  Depression screen [facility] 2/9 06/13/2018 01/20/2018 10/04/2017 04/18/2017 03/25/2017  Decreased Interest 0 0 0 0 0  Down, Depressed,  Hopeless 0 0 0 0 0  PHQ - 2 Score 0 0 0 0 0    Visual Acuity Screening   Right eye Left eye Both eyes  Without correction:     With correction: 20/30 20/15 20/20   optho exam 2 months ago.   Dental: no recent visit.   Exercise: going to gym 3 days per week. Desk job.  Some walking at night as well- 8000 steps per day.    Patient Active Problem List   Diagnosis Date Noted  . OBESITY 05/09/2009  . Essential hypertension 05/09/2009  . HEMOCCULT POSITIVE STOOL 05/09/2009  . HEART MURMUR, HX OF 05/09/2009   Past Medical History:  Diagnosis Date  . Allergy   . Arthritis   . Asthma   . Hypertension    Past Surgical History:  Procedure Laterality Date  . APPENDECTOMY     Allergies  Allergen Reactions  . Penicillins Rash   Prior to Admission medications   Medication Sig Start Date End Date Taking? Authorizing Provider  amLODipine (NORVASC) 10 MG tablet Take 1 tablet (10 mg total) by mouth daily. 04/29/18  Yes Wendie Agreste, MD  doxazosin (CARDURA) 4 MG tablet Take 1 tablet (4 mg total) by mouth daily. 04/29/18  Yes Wendie Agreste, MD  losartan-hydrochlorothiazide [facility]) 100-12.5 MG tablet TAKE 1 TABLET BY MOUTH ONCE DAILY 04/11/18  Yes Wendie Agreste, MD  metoprolol succinate (TOPROL-XL) 100 MG  24 hr tablet Take 1 tablet (100 mg total) by mouth daily. 04/29/18  Yes Wendie Agreste, MD   Social History   Socioeconomic History  . Marital status: Married    Spouse name: Not on file  . Number of children: Not on file  . Years of education: Not on file  . Highest education level: Not on file  Occupational History  . Not on file  Social Needs  . Financial resource strain: Not on file  . Food insecurity:    Worry: Not on file    Inability: Not on file  . Transportation needs:    Medical: Not on file    Non-medical: Not on file  Tobacco Use  . Smoking status: Former Smoker    Last attempt to quit: 02/09/1979    Years since quitting: 39.3  . Smokeless  tobacco: Never Used  Substance and Sexual Activity  . Alcohol use: No    Alcohol/week: 0.0 standard drinks  . Drug use: No  . Sexual activity: Yes  Lifestyle  . Physical activity:    Days per week: Not on file    Minutes per session: Not on file  . Stress: Not on file  Relationships  . Social connections:    Talks on phone: Not on file    Gets together: Not on file    Attends religious service: Not on file    Active member of club or organization: Not on file    Attends meetings of clubs or organizations: Not on file    Relationship status: Not on file  . Intimate partner violence:    Fear of current or ex partner: Not on file    Emotionally abused: Not on file    Physically abused: Not on file    Forced sexual activity: Not on file  Other Topics Concern  . Not on file  Social History Narrative  . Not on file    Review of Systems Negative other than in HPI    Objective:   Physical Exam Vitals signs reviewed.  Constitutional:      Appearance: He is well-developed.  HENT:     Head: Normocephalic and atraumatic.     Right Ear: External ear normal.     Left Ear: External ear normal.  Eyes:     Conjunctiva/sclera: Conjunctivae normal.     Pupils: Pupils are equal, round, and reactive to light.  Neck:     Musculoskeletal: Normal range of motion and neck supple.     Thyroid: No thyromegaly.  Cardiovascular:     Rate and Rhythm: Normal rate and regular rhythm.     Heart sounds: Normal heart sounds.  Pulmonary:     Effort: Pulmonary effort is normal. No respiratory distress.     Breath sounds: Normal breath sounds. No wheezing.  Abdominal:     General: There is no distension.     Palpations: Abdomen is soft.     Tenderness: There is no abdominal tenderness.     Hernia: There is no hernia in the right inguinal area or left inguinal area.  Genitourinary:    Prostate: Normal.  Musculoskeletal: Normal range of motion.        General: No tenderness.  Lymphadenopathy:       Cervical: No cervical adenopathy.  Skin:    General: Skin is warm and dry.  Neurological:     Mental Status: He is alert and oriented to person, place, and time.     Deep Tendon  Reflexes: Reflexes are normal and symmetric.  Psychiatric:        Behavior: Behavior normal.    Vitals:   06/13/18 0831 06/13/18 0841  BP: (!) 147/78 140/78  Pulse: 79   Temp: 98.3 F (36.8 C)   TempSrc: Oral   SpO2: 97%   Weight: (!) 312 lb 3.2 oz (141.6 kg)   Height: 5\' 11"  (1.803 m)        Assessment & Plan:  KILIAN SCHWARTZ is a 63 y.o. male Annual physical exam  - -anticipatory guidance as below in AVS, screening labs above. Health maintenance items as above in HPI discussed/recommended as applicable.   -Commended on exercise, continue watch diet and portion control for weight loss.  Recommended dental follow-up.  Essential hypertension  -Borderline, but stable.  Anticipate improvement with weight changes.  Low testosterone in male  -Previous testing and followed up with specialist as above, no new testing at this time.  Screening for hyperlipidemia - Plan: Comprehensive metabolic panel, Lipid panel  Screening for prostate cancer - Plan: PSA  - We discussed pros and cons of prostate cancer screening, and after this discussion, he chose to have screening done. PSA obtained, and no concerning findings on DRE.   Prediabetes - Plan: Hemoglobin A1c  -As above, commended on exercise.  Watch portion sizes and recheck in 3 months.  Screen with A1c again today.  Screen for colon cancer - Plan: Ambulatory referral to Gastroenterology   No orders of the defined types were placed in this encounter.  Patient Instructions   We will refer you for colonoscopy.  Keep up the good work with exercise. Watch diet and portions, especially at  [employer] Recheck in 3 months for repeat weight and blood pressure assessment.   I will check into obtaining shingrix vaccine for you at our office.   Please  schedule dental visit. Here are a few options:  [facility]  Address: [address], [place], [place] [address]  [email or web address]  Phone: [phone]  [facility]  Address: [address], [place], [place] [address] Phone: [phone]   Keeping you healthy  Get these tests  Blood pressure- Have your blood pressure checked once a year by your healthcare provider.  Normal blood pressure is 120/80  Weight- Have your body mass index (BMI) calculated to screen for obesity.  BMI is a measure of body fat based on height and weight. You can also calculate your own BMI at [email or web address].  Cholesterol- Have your cholesterol checked every year.  Diabetes- Have your blood sugar checked regularly if you have high blood pressure, high cholesterol, have a family history of diabetes or if you are overweight.  Screening for Colon Cancer- Colonoscopy starting at age 69.  Screening may begin sooner depending on your family history and other health conditions. Follow up colonoscopy as directed by your Gastroenterologist.  Screening for Prostate Cancer- Both blood work (PSA) and a rectal exam help screen for Prostate Cancer.  Screening begins at age 66 with African-American men and at age 8 with Caucasian men.  Screening may begin sooner depending on your family history.  Take these medicines  Aspirin- One aspirin daily can help prevent Heart disease and Stroke.  Flu shot- Every fall.  Tetanus- Every 10 years.  Zostavax- Once after the age of 101 to prevent Shingles.  Pneumonia shot- Once after the age of 46; if you are younger than 74, ask your healthcare provider if you need a Pneumonia shot.  Take these steps  Don't smoke- If you do smoke, talk to your doctor about quitting.  For tips on how to quit, go to www.smokefree.gov or call 1-800-QUIT-NOW.  Be physically active- Exercise 5 days a week for at least 30 minutes.  If you are not already  physically active start slow and gradually work up to 30 minutes of moderate physical activity.  Examples of moderate activity include walking briskly, mowing the yard, dancing, swimming, bicycling, etc.  Eat a healthy diet- Eat a variety of healthy food such as fruits, vegetables, low fat milk, low fat cheese, yogurt, lean meant, poultry, fish, beans, tofu, etc. For more information go to www.thenutritionsource.org  Drink alcohol in moderation- Limit alcohol intake to less than two drinks a day. Never drink and drive.  Dentist- Brush and floss twice daily; visit your dentist twice a year.  Depression- Your emotional health is as important as your physical health. If you're feeling down, or losing interest in things you would normally enjoy please talk to your healthcare provider.  Eye exam- Visit your eye doctor every year.  Safe sex- If you may be exposed to a sexually transmitted infection, use a condom.  Seat belts- Seat belts can save your life; always wear one.  Smoke/Carbon Monoxide detectors- These detectors need to be installed on the appropriate level of your home.  Replace batteries at least once a year.  Skin cancer- When out in the sun, cover up and use sunscreen 15 SPF or higher.  Violence- If anyone is threatening you, please tell your healthcare provider.  Living Will/ Health care power of attorney- Speak with your healthcare provider and family.  If you have lab work done today you will be contacted with your lab results within the next 2 weeks.  If you have not heard from [place] then please contact us. The fastest way to get your results is to register for My Chart.   IF you received an x-ray today, you will receive an invoice from [facility]. Please contact [facility] at [phone] with questions or concerns regarding your invoice.   IF you received labwork today, you will receive an invoice from [facility]. Please contact [facility] at [phone] with  questions or concerns regarding your invoice.   Our billing staff will not be able to assist you with questions regarding bills from these companies.  You will be contacted with the lab results as soon as they are available. The fastest way to get your results is to activate your My Chart account. Instructions are located on the last page of this paperwork. If you have not heard from [place] regarding the results in 2 weeks, please contact this office.       Signed,   Merri Ray, MD [facility] at [place].  06/14/18 1:35 PM

## 2018-06-13 NOTE — Patient Instructions (Addendum)
We will refer you for colonoscopy.  Keep up the good work with exercise. Watch diet and portions, especially at  [employer] Recheck in 3 months for repeat weight and blood pressure assessment.   I will check into obtaining shingrix vaccine for you at our office.   Please schedule dental visit. Here are a few options:  [facility]  Address: [address], [place], [place] [address]  [email or web address]  Phone: [phone]  [facility]  Address: [address] [place], [place] [address] Phone: [phone]   Keeping you healthy  Get these tests  Blood pressure- Have your blood pressure checked once a year by your healthcare provider.  Normal blood pressure is 120/80  Weight- Have your body mass index (BMI) calculated to screen for obesity.  BMI is a measure of body fat based on height and weight. You can also calculate your own BMI at [email or web address].  Cholesterol- Have your cholesterol checked every year.  Diabetes- Have your blood sugar checked regularly if you have high blood pressure, high cholesterol, have a family history of diabetes or if you are overweight.  Screening for Colon Cancer- Colonoscopy starting at age 85.  Screening may begin sooner depending on your family history and other health conditions. Follow up colonoscopy as directed by your Gastroenterologist.  Screening for Prostate Cancer- Both blood work (PSA) and a rectal exam help screen for Prostate Cancer.  Screening begins at age 25 with African-American men and at age 81 with Caucasian men.  Screening may begin sooner depending on your family history.  Take these medicines  Aspirin- One aspirin daily can help prevent Heart disease and Stroke.  Flu shot- Every fall.  Tetanus- Every 10 years.  Zostavax- Once after the age of 106 to prevent Shingles.  Pneumonia shot- Once after the age of 97; if you are younger than 40, ask your healthcare provider if you need a  Pneumonia shot.  Take these steps  Don't smoke- If you do smoke, talk to your doctor about quitting.  For tips on how to quit, go to www.smokefree.gov or call 1-800-QUIT-NOW.  Be physically active- Exercise 5 days a week for at least 30 minutes.  If you are not already physically active start slow and gradually work up to 30 minutes of moderate physical activity.  Examples of moderate activity include walking briskly, mowing the yard, dancing, swimming, bicycling, etc.  Eat a healthy diet- Eat a variety of healthy food such as fruits, vegetables, low fat milk, low fat cheese, yogurt, lean meant, poultry, fish, beans, tofu, etc. For more information go to www.thenutritionsource.org  Drink alcohol in moderation- Limit alcohol intake to less than two drinks a day. Never drink and drive.  Dentist- Brush and floss twice daily; visit your dentist twice a year.  Depression- Your emotional health is as important as your physical health. If you're feeling down, or losing interest in things you would normally enjoy please talk to your healthcare provider.  Eye exam- Visit your eye doctor every year.  Safe sex- If you may be exposed to a sexually transmitted infection, use a condom.  Seat belts- Seat belts can save your life; always wear one.  Smoke/Carbon Monoxide detectors- These detectors need to be installed on the appropriate level of your home.  Replace batteries at least once a year.  Skin cancer- When out in the sun, cover up and use sunscreen 15 SPF or higher.  Violence- If anyone is threatening you, please tell your healthcare  provider.  Living Will/ Health care power of attorney- Speak with your healthcare provider and family.  If you have lab work done today you will be contacted with your lab results within the next 2 weeks.  If you have not heard from [place] then please contact us. The fastest way to get your results is to register for My Chart.   IF you received an x-ray today, you  will receive an invoice from [facility]. Please contact [facility] at [phone] with questions or concerns regarding your invoice.   IF you received labwork today, you will receive an invoice from [facility]. Please contact [facility] at [phone] with questions or concerns regarding your invoice.   Our billing staff will not be able to assist you with questions regarding bills from these companies.  You will be contacted with the lab results as soon as they are available. The fastest way to get your results is to activate your My Chart account. Instructions are located on the last page of this paperwork. If you have not heard from [place] regarding the results in 2 weeks, please contact this office.

## 2018-06-14 LAB — HEMOGLOBIN A1C
Est. average glucose Bld gHb Est-mCnc: 117 mg/dL
Hgb A1c MFr Bld: 5.7 % — ABNORMAL HIGH (ref 4.8–5.6)

## 2018-06-14 LAB — COMPREHENSIVE METABOLIC PANEL
ALT: 26 IU/L (ref 0–44)
AST: 33 IU/L (ref 0–40)
Albumin/Globulin Ratio: 1.4 (ref 1.2–2.2)
Albumin: 4.5 g/dL (ref 3.8–4.8)
Alkaline Phosphatase: 58 IU/L (ref 39–117)
BUN/Creatinine Ratio: 24 (ref 10–24)
BUN: 20 mg/dL (ref 8–27)
Bilirubin Total: 0.7 mg/dL (ref 0.0–1.2)
CO2: 18 mmol/L — AB (ref 20–29)
CREATININE: 0.84 mg/dL (ref 0.76–1.27)
Calcium: 9.4 mg/dL (ref 8.6–10.2)
Chloride: 102 mmol/L (ref 96–106)
GFR calc Af Amer: 109 mL/min/{1.73_m2} (ref >59)
GFR, EST NON AFRICAN AMERICAN: 95 mL/min/{1.73_m2} (ref >59)
GLUCOSE: 110 mg/dL — AB (ref 65–99)
Globulin, Total: 3.2 g/dL (ref 1.5–4.5)
Potassium: 4.1 mmol/L (ref 3.5–5.2)
Sodium: 137 mmol/L (ref 134–144)
Total Protein: 7.7 g/dL (ref 6.0–8.5)

## 2018-06-14 LAB — LIPID PANEL
Chol/HDL Ratio: 3.4 ratio (ref 0.0–5.0)
Cholesterol, Total: 170 mg/dL (ref 100–199)
HDL: 50 mg/dL (ref >39)
LDL Calculated: 111 mg/dL — ABNORMAL HIGH (ref 0–99)
Triglycerides: 46 mg/dL (ref 0–149)
VLDL Cholesterol Cal: 9 mg/dL (ref 5–40)

## 2018-06-14 LAB — PSA: Prostate Specific Ag, Serum: 1 ng/mL (ref 0.0–4.0)

## 2018-07-19 ENCOUNTER — Other Ambulatory Visit: Payer: Self-pay | Admitting: Family Medicine

## 2018-07-19 DIAGNOSIS — I10 Essential (primary) hypertension: Secondary | ICD-10-CM

## 2018-09-11 ENCOUNTER — Ambulatory Visit: Payer: 59 | Admitting: Family Medicine

## 2018-10-06 ENCOUNTER — Encounter: Payer: Self-pay | Admitting: Gastroenterology

## 2018-10-06 ENCOUNTER — Telehealth (INDEPENDENT_AMBULATORY_CARE_PROVIDER_SITE_OTHER): Payer: No Typology Code available for payment source | Admitting: Family Medicine

## 2018-10-06 ENCOUNTER — Other Ambulatory Visit: Payer: Self-pay

## 2018-10-06 DIAGNOSIS — R7303 Prediabetes: Secondary | ICD-10-CM

## 2018-10-06 DIAGNOSIS — E785 Hyperlipidemia, unspecified: Secondary | ICD-10-CM

## 2018-10-06 DIAGNOSIS — I10 Essential (primary) hypertension: Secondary | ICD-10-CM

## 2018-10-06 DIAGNOSIS — Z1211 Encounter for screening for malignant neoplasm of colon: Secondary | ICD-10-CM

## 2018-10-06 MED ORDER — DOXAZOSIN MESYLATE 4 MG PO TABS: 4.0000 mg | ORAL_TABLET | Freq: Every day | ORAL | 1 refills | Status: DC

## 2018-10-06 MED ORDER — LOSARTAN POTASSIUM-HCTZ 100-12.5 MG PO TABS: 1.0000 | ORAL_TABLET | Freq: Every day | ORAL | 1 refills | Status: DC

## 2018-10-06 MED ORDER — METOPROLOL SUCCINATE ER 100 MG PO TB24: 100.0000 mg | ORAL_TABLET | Freq: Every day | ORAL | 1 refills | Status: DC

## 2018-10-06 MED ORDER — AMLODIPINE BESYLATE 10 MG PO TABS: 10.0000 mg | ORAL_TABLET | Freq: Every day | ORAL | 1 refills | Status: DC

## 2018-10-06 NOTE — Progress Notes (Signed)
CC- f/u weight and blood pressure- Patient stated his blood pressure this am was 130/82, weight about 320, and blood sugar 108-114. Patient stated he nee refills on his medication.

## 2018-10-06 NOTE — Patient Instructions (Addendum)
Keep up the good work with walking or biking for exercise most days per week with goal of 150 minutes/week.  That should help until the gym is open.  CDC.gov may provide some helpful information regarding safety once those facilities open, including use of facemask.  Follow-up in 2 months and at that time we can recheck cholesterol and prediabetes test.  Continue to try to avoid fast food as much as possible and sugar-containing beverages.  Watch portion sizes, especially at dinner as that tends to be the largest meal for most of my patients.  Let me know if there are questions prior to next visit.  Take care.     If you have lab work done today you will be contacted with your lab results within the next 2 weeks.  If you have not heard from [place] then please contact us. The fastest way to get your results is to register for My Chart.   IF you received an x-ray today, you will receive an invoice from [facility]. Please contact [facility] at [phone] with questions or concerns regarding your invoice.   IF you received labwork today, you will receive an invoice from [facility]. Please contact [facility] at [phone] with questions or concerns regarding your invoice.   Our billing staff will not be able to assist you with questions regarding bills from these companies.  You will be contacted with the lab results as soon as they are available. The fastest way to get your results is to activate your My Chart account. Instructions are located on the last page of this paperwork. If you have not heard from [place] regarding the results in 2 weeks, please contact this office.

## 2018-10-06 NOTE — Progress Notes (Signed)
Virtual Visit via Telephone Note  I connected with Zachary [name] on 10/06/18 at 8:43 AM by telephone and verified that I am speaking with the correct person using two identifiers.   I discussed the limitations, risks, security and privacy concerns of performing an evaluation and management service by telephone and the availability of in person appointments. I also discussed with the patient that there may be a patient responsible charge related to this service. The patient expressed understanding and agreed to proceed, consent obtained  Chief complaint: Hypertension, prediabetes/obesity  History of Present Illness: Zachary [name] is a 63 y.o. male   Hypertension: BP Readings from Last 3 Encounters:  06/13/18 140/78  01/20/18 136/68  10/04/17 (!) 142/70   Lab Results  Component Value Date   CREATININE 0.84 06/13/2018  Status post annual physical exam in February. Home readings 130/82. Currently on amlodipine 10 mg daily, doxazosin 4 mg daily, losartan HCTZ 100/12.5 mg daily, Toprol-XL 100 mg daily.   Prediabetes/Obesity: BMI 43 at last visit in February Wt Readings from Last 3 Encounters:  06/13/18 (!) 312 lb 3.2 oz (141.6 kg)  01/20/18 291 lb 12.8 oz (132.4 kg)  10/04/17 280 lb (127 kg)  Reports home reading of weight about 320, blood sugars 108-114 at home. Difficult to exercise without gyms being open with pandemic.  Walking in evening most days - 5000 steps, recently bought a bike.  mcmuffin in am, but no other fast food. No sweet tea/soda.  Occasional wine on W/E only.   Lab Results  Component Value Date   HGBA1C 5.7 (H) 06/13/2018   Hyperlipidemia:  Lab Results  Component Value Date   CHOL 170 06/13/2018   HDL 50 06/13/2018   LDLCALC 111 (H) 06/13/2018   TRIG 46 06/13/2018   CHOLHDL 3.4 06/13/2018   Lab Results  Component Value Date   ALT 26 06/13/2018   AST 33 06/13/2018   ALKPHOS 58 06/13/2018   BILITOT 0.7 06/13/2018  ASCVD risk score was  approximately 15% in February.  Discussed statin recommendation but planned diet changes and exercise. With pandemic.    Health maintenance: Referred for colonoscopy at February visit.plan for follow up with [facility] - needed new referral.     Patient Active Problem List   Diagnosis Date Noted  . OBESITY 05/09/2009  . Essential hypertension 05/09/2009  . HEMOCCULT POSITIVE STOOL 05/09/2009  . HEART MURMUR, HX OF 05/09/2009   Past Medical History:  Diagnosis Date  . Allergy   . Arthritis   . Asthma   . Hypertension    Past Surgical History:  Procedure Laterality Date  . APPENDECTOMY     Allergies  Allergen Reactions  . Penicillins Rash   Prior to Admission medications   Medication Sig Start Date End Date Taking? Authorizing Provider  amLODipine (NORVASC) 10 MG tablet Take 1 tablet (10 mg total) by mouth daily. 04/29/18   Wendie Agreste, MD  doxazosin (CARDURA) 4 MG tablet Take 1 tablet (4 mg total) by mouth daily. 04/29/18   Wendie Agreste, MD  losartan-hydrochlorothiazide [facility]) 100-12.5 MG tablet Take 1 tablet by mouth once daily 07/20/18   Wendie Agreste, MD  metoprolol succinate (TOPROL-XL) 100 MG 24 hr tablet Take 1 tablet (100 mg total) by mouth daily. 04/29/18   Wendie Agreste, MD   Social History   Socioeconomic History  . Marital status: Married    Spouse name: Not on file  . Number of children: Not on file  .  Years of education: Not on file  . Highest education level: Not on file  Occupational History  . Not on file  Social Needs  . Financial resource strain: Not on file  . Food insecurity:    Worry: Not on file    Inability: Not on file  . Transportation needs:    Medical: Not on file    Non-medical: Not on file  Tobacco Use  . Smoking status: Former Smoker    Last attempt to quit: 02/09/1979    Years since quitting: 39.6  . Smokeless tobacco: Never Used  Substance and Sexual Activity  . Alcohol use: No    Alcohol/week: 0.0 standard  drinks  . Drug use: No  . Sexual activity: Yes  Lifestyle  . Physical activity:    Days per week: Not on file    Minutes per session: Not on file  . Stress: Not on file  Relationships  . Social connections:    Talks on phone: Not on file    Gets together: Not on file    Attends religious service: Not on file    Active member of club or organization: Not on file    Attends meetings of clubs or organizations: Not on file    Relationship status: Not on file  . Intimate partner violence:    Fear of current or ex partner: Not on file    Emotionally abused: Not on file    Physically abused: Not on file    Forced sexual activity: Not on file  Other Topics Concern  . Not on file  Social History Narrative  . Not on file     Observations/Objective: Appropriate responses, no distress on phone.  All questions answered with understanding of plan expressed  Assessment and Plan: Essential hypertension - Plan: amLODipine (NORVASC) 10 MG tablet, doxazosin (CARDURA) 4 MG tablet, losartan-hydrochlorothiazide (HYZAAR) 100-12.5 MG tablet, metoprolol succinate (TOPROL-XL) 100 MG 24 hr tablet  -Stable on home readings, continue same regimen  Special screening for malignant neoplasms, colon - Plan: Ambulatory referral to Gastroenterology  -[facility] referral [facility] to [facility] gastroenterology  Prediabetes  -Weight has increased.  Plans to increase exercise, dietary guidance provided, recheck in office in 2 months for repeat labs  Hyperlipidemia, unspecified hyperlipidemia type  -Potential benefit of statin has been discussed given ASCVD risk, but exercise has been impacted by pandemic.  Plans to increase exercise with biking and return to gym once open. Would like to wait on meds and recheck levels in 2 months at office visit.  Follow Up Instructions: 2 months.  Patient Instructions       If you have lab work done today you will be contacted with your lab results within the next 2 weeks.  If  you have not heard from [place] then please contact us. The fastest way to get your results is to register for My Chart.   IF you received an x-ray today, you will receive an invoice from [facility]. Please contact [facility] at [phone] with questions or concerns regarding your invoice.   IF you received labwork today, you will receive an invoice from [facility]. Please contact [facility] at [phone] with questions or concerns regarding your invoice.   Our billing staff will not be able to assist you with questions regarding bills from these companies.  You will be contacted with the lab results as soon as they are available. The fastest way to get your results is to activate your My Chart account. Instructions are  located on the last page of this paperwork. If you have not heard from [place] regarding the results in 2 weeks, please contact this office.          I discussed the assessment and treatment plan with the patient. The patient was provided an opportunity to ask questions and all were answered. The patient agreed with the plan and demonstrated an understanding of the instructions.   The patient was advised to call back or seek an in-person evaluation if the symptoms worsen or if the condition fails to improve as anticipated.  I provided 17 minutes of non-face-to-face time during this encounter.  Signed,   Merri Ray, MD [facility].  10/06/18

## 2018-11-10 ENCOUNTER — Other Ambulatory Visit: Payer: Self-pay

## 2018-11-10 ENCOUNTER — Ambulatory Visit (AMBULATORY_SURGERY_CENTER): Payer: Self-pay | Admitting: *Deleted

## 2018-11-10 VITALS — Ht 72.0 in | Wt 330.0 lb

## 2018-11-10 DIAGNOSIS — Z1211 Encounter for screening for malignant neoplasm of colon: Secondary | ICD-10-CM

## 2018-11-10 MED ORDER — SUPREP BOWEL PREP KIT 17.5-3.13-1.6 GM/177ML PO SOLN: 1.0000 | Freq: Once | ORAL | 0 refills | Status: AC

## 2018-11-10 NOTE — Progress Notes (Signed)
No egg or soy allergy known to patient  No issues with past sedation with any surgeries  or procedures, no intubation problems  No diet pills per patient No home 02 use per patient  No blood thinners per patient  Pt states issues with constipation - last year issues- not daily but consistent- stools are like pebbles at times - Pt does stool  daily at least once-  occ soft, others hard pebbles - will do a 2 day Suprep   No A fib or A flutter  EMMI video sent to pt's e mail   Pt verified name, DOB, address and insurance during PV today. Pt mailed instruction packet to included paper to complete and mail back to [facility] with addressed and stamped envelope, Emmi video, copy of consent form to read and not return, and instructions. Suprep $15  coupon mailed in packet. PV completed over the phone. Pt encouraged to call with questions or issues   Pt is aware that care partner will wait in the car during procedure; if they feel like they will be too hot to wait in the car; they may wait in the lobby.  We want them to wear a mask (we do not have any that we can provide them), practice social distancing, and we will check their temperatures when they get here.  I did remind patient that their care partner needs to stay in the parking lot the entire time. Pt will wear mask into building.

## 2018-11-21 ENCOUNTER — Telehealth: Payer: Self-pay | Admitting: Gastroenterology

## 2018-11-21 NOTE — Telephone Encounter (Signed)

## 2018-11-21 NOTE — Telephone Encounter (Signed)
All answers are no. °

## 2018-11-24 ENCOUNTER — Encounter: Payer: Self-pay | Admitting: Gastroenterology

## 2018-11-24 ENCOUNTER — Ambulatory Visit (AMBULATORY_SURGERY_CENTER): Payer: No Typology Code available for payment source | Admitting: Gastroenterology

## 2018-11-24 ENCOUNTER — Other Ambulatory Visit: Payer: Self-pay

## 2018-11-24 VITALS — BP 114/54 | HR 80 | Temp 98.4°F | Resp 16 | Ht 72.0 in | Wt 330.0 lb

## 2018-11-24 DIAGNOSIS — Z1211 Encounter for screening for malignant neoplasm of colon: Secondary | ICD-10-CM

## 2018-11-24 DIAGNOSIS — D123 Benign neoplasm of transverse colon: Secondary | ICD-10-CM

## 2018-11-24 DIAGNOSIS — D122 Benign neoplasm of ascending colon: Secondary | ICD-10-CM

## 2018-11-24 MED ORDER — SODIUM CHLORIDE 0.9 % IV SOLN: 500.0000 mL | Freq: Once | INTRAVENOUS | Status: DC

## 2018-11-24 NOTE — Op Note (Signed)
[facility] Patient Name: Zachary [name] Procedure Date: 11/24/2018 11:23 AM MRN: [id number] Endoscopist: Remo Lipps P. Havery Moros , MD Age: 63 Referring MD:  Date of Birth: 09-Dec-1955 Gender: Male Account #: [id number] Procedure:                Colonoscopy Indications:              Screening for colorectal malignant neoplasm Medicines:                Monitored Anesthesia Care Procedure:                Pre-Anesthesia Assessment:                           - Prior to the procedure, a History and Physical                            was performed, and patient medications and                            allergies were reviewed. The patient's tolerance of                            previous anesthesia was also reviewed. The risks                            and benefits of the procedure and the sedation                            options and risks were discussed with the patient.                            All questions were answered, and informed consent                            was obtained. Prior Anticoagulants: The patient has                            taken no previous anticoagulant or antiplatelet                            agents. ASA Grade Assessment: III - A patient with                            severe systemic disease. After reviewing the risks                            and benefits, the patient was deemed in                            satisfactory condition to undergo the procedure.                           After obtaining informed consent, the colonoscope  was passed under direct vision. Throughout the                            procedure, the patient's blood pressure, pulse, and                            oxygen saturations were monitored continuously. The                            Colonoscope was introduced through the anus and                            advanced to the the cecum, identified by                            appendiceal orifice  and ileocecal valve. The                            colonoscopy was performed without difficulty. The                            patient tolerated the procedure well. The quality                            of the bowel preparation was good. The ileocecal                            valve, appendiceal orifice, and rectum were                            photographed. Scope In: 11:27:26 AM Scope Out: 11:48:39 AM Scope Withdrawal Time: 0 hours 15 minutes 27 seconds  Total Procedure Duration: 0 hours 21 minutes 13 seconds  Findings:                 The perianal and digital rectal examinations were                            normal.                           The terminal ileum appeared normal.                           A 3-4 mm polyp was found in the ascending colon.                            The polyp was sessile. The polyp was removed with a                            cold snare. Resection and retrieval were complete.                           A 4 mm polyp was found in the transverse colon. The  polyp was flat. The polyp was removed with a cold                            snare. Resection and retrieval were complete.                           Scattered small-mouthed diverticula were found in                            the transverse colon and left colon.                           Internal hemorrhoids were found during retroflexion.                           The exam was otherwise without abnormality. Complications:            No immediate complications. Estimated blood loss:                            Minimal. Estimated Blood Loss:     Estimated blood loss was minimal. Impression:               - The examined portion of the ileum was normal.                           - One 3 mm polyp in the ascending colon, removed                            with a cold snare. Resected and retrieved.                           - One 4 mm polyp in the transverse colon, removed                             with a cold snare. Resected and retrieved.                           - Diverticulosis in the transverse colon and in the                            left colon.                           - Internal hemorrhoids.                           - The examination was otherwise normal. Recommendation:           - Patient has a contact number available for                            emergencies. The signs and symptoms of potential  delayed complications were discussed with the                            patient. Return to normal activities tomorrow.                            Written discharge instructions were provided to the                            patient.                           - Resume previous diet.                           - Continue present medications.                           - Await pathology results. Remo Lipps P. Cliford Sequeira, MD 11/24/2018 11:52:32 AM This report has been signed electronically.

## 2018-11-24 NOTE — Progress Notes (Signed)
PT taken to PACU. Monitors in place. VSS. Report given to RN. 

## 2018-11-24 NOTE — Progress Notes (Signed)
Pt's states no medical or surgical changes since previsit or office visit. 

## 2018-11-24 NOTE — Patient Instructions (Signed)
YOU HAD AN ENDOSCOPIC PROCEDURE TODAY AT [facility]:   Refer to the procedure report that was given to you for any specific questions about what was found during the examination.  If the procedure report does not answer your questions, please call your gastroenterologist to clarify.  If you requested that your care partner not be given the details of your procedure findings, then the procedure report has been included in a sealed envelope for you to review at your convenience later.  YOU SHOULD EXPECT: Some feelings of bloating in the abdomen. Passage of more gas than usual.  Walking can help get rid of the air that was put into your GI tract during the procedure and reduce the bloating. If you had a lower endoscopy (such as a colonoscopy or flexible sigmoidoscopy) you may notice spotting of blood in your stool or on the toilet paper. If you underwent a bowel prep for your procedure, you may not have a normal bowel movement for a few days.  Please Note:  You might notice some irritation and congestion in your nose or some drainage.  This is from the oxygen used during your procedure.  There is no need for concern and it should clear up in a day or so.  SYMPTOMS TO REPORT IMMEDIATELY:   Following lower endoscopy (colonoscopy or flexible sigmoidoscopy):  Excessive amounts of blood in the stool  Significant tenderness or worsening of abdominal pains  Swelling of the abdomen that is new, acute  Fever of 100F or higher   Please see handouts given to you on Polyps, Diverticulosis and Hemorrhoids.  For urgent or emergent issues, a gastroenterologist can be reached at any hour by calling [phone].   DIET:  We do recommend a small meal at first, but then you may proceed to your regular diet.  Drink plenty of fluids but you should avoid alcoholic beverages for 24 hours.  ACTIVITY:  You should plan to take it easy for the rest of today and you should NOT DRIVE or use heavy  machinery until tomorrow (because of the sedation medicines used during the test).    FOLLOW UP: Our staff will call the number listed on your records 48-72 hours following your procedure to check on you and address any questions or concerns that you may have regarding the information given to you following your procedure. If we do not reach you, we will leave a message.  We will attempt to reach you two times.  During this call, we will ask if you have developed any symptoms of COVID 19. If you develop any symptoms (ie: fever, flu-like symptoms, shortness of breath, cough etc.) before then, please call [phone].  If you test positive for Covid 19 in the 2 weeks post procedure, please call and report this information to [place].    If any biopsies were taken you will be contacted by phone or by letter within the next 1-3 weeks.  Please call us at [phone] if you have not heard about the biopsies in 3 weeks.    SIGNATURES/CONFIDENTIALITY: You and/or your care partner have signed paperwork which will be entered into your electronic medical record.  These signatures attest to the fact that that the information above on your After Visit Summary has been reviewed and is understood.  Full responsibility of the confidentiality of this discharge information lies with you and/or your care-partner.  Thank you for letting us take care of your heatlhcare needs today.

## 2018-11-24 NOTE — Progress Notes (Signed)
Called to room to assist during endoscopic procedure.  Patient ID and intended procedure confirmed with present staff. Received instructions for my participation in the procedure from the performing physician.  

## 2018-11-24 NOTE — Progress Notes (Signed)
[place]

## 2018-11-26 ENCOUNTER — Telehealth: Payer: Self-pay

## 2018-11-26 NOTE — Telephone Encounter (Signed)
  Follow up Call-  Call back number 11/24/2018  Post procedure Call Back phone  # [phone]  Permission to leave phone message Yes  Some recent data might be hidden     Patient questions:  Do you have a fever, pain , or abdominal swelling? Yes.   Pain Score  0 *  Have you tolerated food without any problems? Yes.    Have you been able to return to your normal activities? Yes.    Do you have any questions about your discharge instructions: Diet   No. Medications  No. Follow up visit  No.  Do you have questions or concerns about your Care? No.  Actions: * If pain score is 4 or above: No action needed, pain <4.  1. Have you developed a fever since your procedure? no  2.   Have you had an respiratory symptoms (SOB or cough) since your procedure? no  3.   Have you tested positive for COVID 19 since your procedure no  4.   Have you had any family members/close contacts diagnosed with the COVID 19 since your procedure?  no   If yes to any of these questions please route to Joylene John, RN and Alphonsa Gin, Therapist, sports.

## 2019-04-13 ENCOUNTER — Telehealth (INDEPENDENT_AMBULATORY_CARE_PROVIDER_SITE_OTHER): Payer: No Typology Code available for payment source | Admitting: Family Medicine

## 2019-04-13 ENCOUNTER — Other Ambulatory Visit: Payer: Self-pay

## 2019-04-13 DIAGNOSIS — E785 Hyperlipidemia, unspecified: Secondary | ICD-10-CM

## 2019-04-13 DIAGNOSIS — I10 Essential (primary) hypertension: Secondary | ICD-10-CM | POA: Diagnosis not present

## 2019-04-13 DIAGNOSIS — R7303 Prediabetes: Secondary | ICD-10-CM | POA: Diagnosis not present

## 2019-04-13 MED ORDER — METOPROLOL SUCCINATE ER 100 MG PO TB24: 100.0000 mg | ORAL_TABLET | Freq: Every day | ORAL | 1 refills | Status: DC

## 2019-04-13 MED ORDER — AMLODIPINE BESYLATE 10 MG PO TABS: 10.0000 mg | ORAL_TABLET | Freq: Every day | ORAL | 1 refills | Status: DC

## 2019-04-13 MED ORDER — LOSARTAN POTASSIUM-HCTZ 100-25 MG PO TABS: 1.0000 | ORAL_TABLET | Freq: Every day | ORAL | 1 refills | Status: DC

## 2019-04-13 MED ORDER — DOXAZOSIN MESYLATE 4 MG PO TABS: 4.0000 mg | ORAL_TABLET | Freq: Every day | ORAL | 1 refills | Status: DC

## 2019-04-13 NOTE — Progress Notes (Signed)
Virtual Visit via Video Note  I connected with Zachary [name] on 04/13/19 at 2:14 PM by a video enabled telemedicine application and verified that I am speaking with the correct person using two identifiers.   I discussed the limitations, risks, security and privacy concerns of performing an evaluation and management service by telephone and the availability of in person appointments. I also discussed with the patient that there may be a patient responsible charge related to this service. The patient expressed understanding and agreed to proceed, consent obtained  Chief complaint:  Htn. Med refills.   History of Present Illness: Zachary [name] is a 63 y.o. male  Hypertension: Last discussed at telemedicine visit in June.  Home readings 130/82 at that time.  Was continued on amlodipine 10 mg daily, doxazosin 4 mg daily, losartan HCTZ 100/12.5 mg daily, Toprol-XL 100 mg daily. Home readings: 140 -150/85-86 Weight 335.   Wt Readings from Last 3 Encounters:  11/24/18 (!) 330 lb (149.7 kg)  11/10/18 (!) 330 lb (149.7 kg)  06/13/18 (!) 312 lb 3.2 oz (141.6 kg)      Constitutional: Negative for fatigue and unexpected weight change - weight has increased.  Eyes: Negative for visual disturbance.  Respiratory: Negative for cough, chest tightness and shortness of breath.   Cardiovascular: Negative for chest pain, palpitations and leg swelling.  Gastrointestinal: Negative for abdominal pain and blood in stool.  Neurological: Negative for dizziness, light-headedness and headaches.     BP Readings from Last 3 Encounters:  11/24/18 (!) 114/54  06/13/18 140/78  01/20/18 136/68   Lab Results  Component Value Date   CREATININE 0.84 06/13/2018   Prediabetes Concern regarding increased weight discussed last visit in June by telemedicine.  Planned on increase exercise, dietary guidance was provided with plan for 83-month office visit for repeat labs.  Has not had retested since February.  Has  not been exercising as gym closed. Has not been riding bicycle - tough with long days at work.  4000 steps per day.  Some diet indiscretion in holidays - sodas. - better now. Less now. No regular fast food - subway in the morning.  Lab Results  Component Value Date   HGBA1C 5.7 (H) 06/13/2018   Hyperlipidemia: Not on statin.  Elevated ASCVD risk for previously.  Exercise had been impacted by pandemic and based on our discussion in June he plan to increase exercise with biking and medication declined. Lab Results  Component Value Date   CHOL 170 06/13/2018   HDL 50 06/13/2018   LDLCALC 111 (H) 06/13/2018   TRIG 46 06/13/2018   CHOLHDL 3.4 06/13/2018   Lab Results  Component Value Date   ALT 26 06/13/2018   AST 33 06/13/2018   ALKPHOS 58 06/13/2018   BILITOT 0.7 06/13/2018      Patient Active Problem List   Diagnosis Date Noted  . OBESITY 05/09/2009  . Essential hypertension 05/09/2009  . HEMOCCULT POSITIVE STOOL 05/09/2009  . HEART MURMUR, HX OF 05/09/2009   Past Medical History:  Diagnosis Date  . Allergy   . Arthritis   . Asthma   . Blood transfusion without reported diagnosis    as child  . Constipation   . Heart murmur    mild  . Hyperlipidemia    slight elevation  . Hypertension    Past Surgical History:  Procedure Laterality Date  . APPENDECTOMY    . COLONOSCOPY  2008  . KNEE SURGERY    . pilonidal abcess  x 2 surgeries to correct this  . TONSILLECTOMY     Allergies  Allergen Reactions  . Penicillins Rash   Prior to Admission medications   Medication Sig Start Date End Date Taking? Authorizing Provider  Acetaminophen (TYLENOL ARTHRITIS EXT RELIEF PO) Take by mouth.   Yes [provider]  amLODipine (NORVASC) 10 MG tablet Take 1 tablet (10 mg total) by mouth daily. 10/06/18  Yes Wendie Agreste, MD  doxazosin (CARDURA) 4 MG tablet Take 1 tablet (4 mg total) by mouth daily. 10/06/18  Yes Wendie Agreste, MD  ibuprofen (ADVIL) 200 MG  tablet Take 400 mg by mouth every 6 (six) hours as needed.   Yes [provider]  losartan-hydrochlorothiazide (HYZAAR) 100-12.5 MG tablet Take 1 tablet by mouth daily. 10/06/18  Yes Wendie Agreste, MD  metoprolol succinate (TOPROL-XL) 100 MG 24 hr tablet Take 1 tablet (100 mg total) by mouth daily. 10/06/18  Yes Wendie Agreste, MD   Social History   Socioeconomic History  . Marital status: Married    Spouse name: Not on file  . Number of children: Not on file  . Years of education: Not on file  . Highest education level: Not on file  Occupational History  . Not on file  Tobacco Use  . Smoking status: Former Smoker    Quit date: 02/09/1979    Years since quitting: 40.2  . Smokeless tobacco: Never Used  Substance and Sexual Activity  . Alcohol use: Yes    Alcohol/week: 0.0 standard drinks    Comment: occ glass of wine   . Drug use: No  . Sexual activity: Yes  Other Topics Concern  . Not on file  Social History Narrative  . Not on file   Social Determinants of Health   Financial Resource Strain:   . Difficulty of Paying Living Expenses: Not on file  Food Insecurity:   . Worried About Charity fundraiser in the Last Year: Not on file  . Ran Out of Food in the Last Year: Not on file  Transportation Needs:   . Lack of Transportation (Medical): Not on file  . Lack of Transportation (Non-Medical): Not on file  Physical Activity:   . Days of Exercise per Week: Not on file  . Minutes of Exercise per Session: Not on file  Stress:   . Feeling of Stress : Not on file  Social Connections:   . Frequency of Communication with Friends and Family: Not on file  . Frequency of Social Gatherings with Friends and Family: Not on file  . Attends Religious Services: Not on file  . Active Member of Clubs or Organizations: Not on file  . Attends Archivist Meetings: Not on file  . Marital Status: Not on file  Intimate Partner Violence:   . Fear of Current or  Ex-Partner: Not on file  . Emotionally Abused: Not on file  . Physically Abused: Not on file  . Sexually Abused: Not on file    Observations/Objective: Speaking normally, no distress. All questions answered.   Home weight 335. BP: 161/78 Assessment and Plan: Prediabetes - Plan: Hemoglobin A1c  -Lab only visit planned this week.  Walking or low intensity exercise discussed with minimum 180 minutes/week.  Recheck in office in 3 months.  Essential hypertension - Plan: amLODipine (NORVASC) 10 MG tablet, doxazosin (CARDURA) 4 MG tablet, losartan-hydrochlorothiazide (HYZAAR) 100-25 MG tablet, metoprolol succinate (TOPROL-XL) 100 MG 24 hr tablet, Comprehensive metabolic panel  -Decreased control  by home readings.  Adjusted losartan HCTZ for higher dose of hydrochlorothiazide, continue home monitoring, RTC precautions with recheck in 3 months.  Hyperlipidemia, unspecified hyperlipidemia type - Plan: Lipid Panel  -Check lipids, then ASCVD risk score.  Suspect statin will be recommended.  Exercise/diet changes for weight loss as above.  Follow Up Instructions: Patient Instructions   Please come in for labs this Friday. - fasting lab visit.  Walking or other low intensity exercise most days per week - minimum of 150 minutes.  Losartan hctz combo pill adjusted for better blood pressure control. No change in other meds for now.  Recheck in office in 3 months.      I discussed the assessment and treatment plan with the patient. The patient was provided an opportunity to ask questions and all were answered. The patient agreed with the plan and demonstrated an understanding of the instructions.   The patient was advised to call back or seek an in-person evaluation if the symptoms worsen or if the condition fails to improve as anticipated.  I provided 15 minutes of non-face-to-face time during this encounter.   Wendie Agreste, MD

## 2019-04-13 NOTE — Patient Instructions (Signed)
  Please come in for labs this Friday. - fasting lab visit.  Walking or other low intensity exercise most days per week - minimum of 150 minutes.  Losartan hctz combo pill adjusted for better blood pressure control. No change in other meds for now.  Recheck in office in 3 months.

## 2019-04-13 NOTE — Progress Notes (Signed)
CC- med refill- need refills on pended meds. patient understand that he will need to come in to have labs done. And labs has also been pended. Patient have no other issue to discuss today.

## 2019-04-14 NOTE — Addendum Note (Signed)
Addended by: Merri Ray R on: 04/14/2019 11:45 AM   Modules accepted: Level of Service

## 2019-04-17 ENCOUNTER — Other Ambulatory Visit: Payer: Self-pay

## 2019-04-17 ENCOUNTER — Ambulatory Visit (INDEPENDENT_AMBULATORY_CARE_PROVIDER_SITE_OTHER): Payer: No Typology Code available for payment source | Admitting: Family Medicine

## 2019-04-17 DIAGNOSIS — E785 Hyperlipidemia, unspecified: Secondary | ICD-10-CM

## 2019-04-17 DIAGNOSIS — Z23 Encounter for immunization: Secondary | ICD-10-CM

## 2019-04-17 DIAGNOSIS — R7303 Prediabetes: Secondary | ICD-10-CM

## 2019-04-17 DIAGNOSIS — I10 Essential (primary) hypertension: Secondary | ICD-10-CM

## 2019-04-18 LAB — COMPREHENSIVE METABOLIC PANEL
ALT: 27 IU/L (ref 0–44)
AST: 22 IU/L (ref 0–40)
Albumin/Globulin Ratio: 1.5 (ref 1.2–2.2)
Albumin: 4.5 g/dL (ref 3.8–4.8)
Alkaline Phosphatase: 67 IU/L (ref 39–117)
BUN/Creatinine Ratio: 18 (ref 10–24)
BUN: 17 mg/dL (ref 8–27)
Bilirubin Total: 0.6 mg/dL (ref 0.0–1.2)
CO2: 21 mmol/L (ref 20–29)
Calcium: 9.8 mg/dL (ref 8.6–10.2)
Chloride: 100 mmol/L (ref 96–106)
Creatinine, Ser: 0.93 mg/dL (ref 0.76–1.27)
GFR calc Af Amer: 101 mL/min/{1.73_m2} (ref >59)
GFR calc non Af Amer: 88 mL/min/{1.73_m2} (ref >59)
Globulin, Total: 3 g/dL (ref 1.5–4.5)
Glucose: 125 mg/dL — ABNORMAL HIGH (ref 65–99)
Potassium: 4.9 mmol/L (ref 3.5–5.2)
Sodium: 139 mmol/L (ref 134–144)
Total Protein: 7.5 g/dL (ref 6.0–8.5)

## 2019-04-18 LAB — LIPID PANEL
Chol/HDL Ratio: 4.6 ratio (ref 0.0–5.0)
Cholesterol, Total: 180 mg/dL (ref 100–199)
HDL: 39 mg/dL — ABNORMAL LOW (ref >39)
LDL Chol Calc (NIH): 130 mg/dL — ABNORMAL HIGH (ref 0–99)
Triglycerides: 59 mg/dL (ref 0–149)
VLDL Cholesterol Cal: 11 mg/dL (ref 5–40)

## 2019-04-18 LAB — HEMOGLOBIN A1C
Est. average glucose Bld gHb Est-mCnc: 131 mg/dL
Hgb A1c MFr Bld: 6.2 % — ABNORMAL HIGH (ref 4.8–5.6)

## 2019-07-06 ENCOUNTER — Ambulatory Visit: Payer: No Typology Code available for payment source | Admitting: Family Medicine

## 2019-10-04 ENCOUNTER — Other Ambulatory Visit: Payer: Self-pay | Admitting: Family Medicine

## 2019-10-04 DIAGNOSIS — I10 Essential (primary) hypertension: Secondary | ICD-10-CM

## 2019-10-04 NOTE — Telephone Encounter (Signed)
Requested Prescriptions  Pending Prescriptions Disp Refills  . losartan-hydrochlorothiazide (HYZAAR) 100-25 MG tablet [Pharmacy Med Name: Losartan Potassium-HCTZ 100-25 MG Oral Tablet] 90 tablet 0    Sig: Take 1 tablet by mouth once daily     Cardiovascular: ARB + Diuretic Combos Passed - 10/04/2019  8:56 AM      Passed - K in normal range and within 180 days    Potassium  Date Value Ref Range Status  04/17/2019 4.9 3.5 - 5.2 mmol/L Final         Passed - Na in normal range and within 180 days    Sodium  Date Value Ref Range Status  04/17/2019 139 134 - 144 mmol/L Final         Passed - Cr in normal range and within 180 days    Creat  Date Value Ref Range Status  04/08/2015 1.04 0.70 - 1.33 mg/dL Final   Creatinine, Ser  Date Value Ref Range Status  04/17/2019 0.93 0.76 - 1.27 mg/dL Final         Passed - Ca in normal range and within 180 days    Calcium  Date Value Ref Range Status  04/17/2019 9.8 8.6 - 10.2 mg/dL Final         Passed - Patient is not pregnant      Passed - Last BP in normal range    BP Readings from Last 1 Encounters:  11/24/18 (!) 114/54         Passed - Valid encounter within last 6 months    Recent Outpatient Visits          5 months ago Prediabetes   [facility], MD   5 months ago Essential hypertension   [facility], Ranell Patrick, MD   12 months ago Essential hypertension   [facility], Ranell Patrick, MD   1 year ago Annual physical exam   [facility], Ranell Patrick, MD   1 year ago Essential hypertension   [facility], Ranell Patrick, MD

## 2019-10-28 ENCOUNTER — Other Ambulatory Visit: Payer: Self-pay

## 2019-10-28 ENCOUNTER — Ambulatory Visit: Payer: BC Managed Care – PPO | Admitting: Family Medicine

## 2019-10-28 VITALS — BP 144/78 | HR 89 | Temp 98.2°F | Resp 16 | Ht 72.0 in | Wt 340.6 lb

## 2019-10-28 DIAGNOSIS — E785 Hyperlipidemia, unspecified: Secondary | ICD-10-CM

## 2019-10-28 DIAGNOSIS — I10 Essential (primary) hypertension: Secondary | ICD-10-CM | POA: Diagnosis not present

## 2019-10-28 DIAGNOSIS — E66813 Obesity, class 3: Secondary | ICD-10-CM

## 2019-10-28 DIAGNOSIS — Z6841 Body Mass Index (BMI) 40.0 and over, adult: Secondary | ICD-10-CM

## 2019-10-28 DIAGNOSIS — R7303 Prediabetes: Secondary | ICD-10-CM | POA: Diagnosis not present

## 2019-10-28 MED ORDER — ATORVASTATIN CALCIUM 10 MG PO TABS: 10.0000 mg | ORAL_TABLET | Freq: Every day | ORAL | 0 refills | Status: DC

## 2019-10-28 MED ORDER — DOXAZOSIN MESYLATE 4 MG PO TABS: 4.0000 mg | ORAL_TABLET | Freq: Every day | ORAL | 1 refills | Status: DC

## 2019-10-28 MED ORDER — LOSARTAN POTASSIUM-HCTZ 100-25 MG PO TABS: 1.0000 | ORAL_TABLET | Freq: Every day | ORAL | 1 refills | Status: DC

## 2019-10-28 MED ORDER — METOPROLOL SUCCINATE ER 100 MG PO TB24: 100.0000 mg | ORAL_TABLET | Freq: Every day | ORAL | 1 refills | Status: DC

## 2019-10-28 MED ORDER — AMLODIPINE BESYLATE 10 MG PO TABS: 10.0000 mg | ORAL_TABLET | Freq: Every day | ORAL | 1 refills | Status: DC

## 2019-10-28 NOTE — Progress Notes (Signed)
Subjective:  Patient ID: Zachary [name], male    DOB: 09/15/1955  Age: 64 y.o. MRN: [id number]  CC:  Chief Complaint  Patient presents with  . Hypertension    pt has been doing well no concerns at this time pt denies physical symptoms, pt denies side effects, pt needs refills on all BP meds today     HPI KASSON LAMERE presents for  Hypertension: Amlodipine 82m qd, losartan hct 100/25 qam, doxazosin 436mqd, and toprol xl 10038mam Home readings: 130's/70-80's.   BP Readings from Last 3 Encounters:  10/28/19 (!) 144/78  11/24/18 (!) 114/54  06/13/18 140/78   Lab Results  Component Value Date   CREATININE 0.93 04/17/2019    Hyperlipidemia: No current statin. Plan for diet/exercise in 03/2019.  The 10-year ASCVD risk score (GoMikey Bussing Jr.Brooke Bonitoet al., 2013) is: 19.2%   Values used to calculate the score:     Age: [age]     Sex: Male     Is Non-Hispanic African American: Yes     Diabetic: No     Tobacco smoker: No     Systolic Blood Pressure: 144338Hg     Is BP treated: Yes     HDL Cholesterol: 39 mg/dL     Total Cholesterol: 180 mg/dL  Lab Results  Component Value Date   CHOL 180 04/17/2019   HDL 39 (L) 04/17/2019   LDLCALC 130 (H) 04/17/2019   TRIG 59 04/17/2019   CHOLHDL 4.6 04/17/2019   Lab Results  Component Value Date   ALT 27 04/17/2019   AST 22 04/17/2019   ALKPHOS 67 04/17/2019   BILITOT 0.6 04/17/2019      Obesity, prediabetes: Home readings - 125-130 in am, daytime readings 110, sometimes in 90's.  Not back to exercise in gym. No regular exercise. Limited by occasional knee pain. Pool available at gym.  Fast food: mcmuffin 3x/week.  Sweet tea/soda: none.  Alcohol: 3 per week - wine.  Had covid vaccine.  Body mass index is 46.19 kg/m. Wt Readings from Last 3 Encounters:  10/28/19 (!) 340 lb 9.6 oz (154.5 kg)  11/24/18 (!) 330 lb (149.7 kg)  11/10/18 (!) 330 lb (149.7 kg)   Lab Results  Component Value Date   HGBA1C 6.2 (H) 04/17/2019     Has not met with medical or surgical bariatrics.        History Patient Active Problem List   Diagnosis Date Noted  . OBESITY 05/09/2009  . Essential hypertension 05/09/2009  . HEMOCCULT POSITIVE STOOL 05/09/2009  . HEART MURMUR, HX OF 05/09/2009   Past Medical History:  Diagnosis Date  . Allergy   . Arthritis   . Asthma   . Blood transfusion without reported diagnosis    as child  . Constipation   . Heart murmur    mild  . Hyperlipidemia    slight elevation  . Hypertension    Past Surgical History:  Procedure Laterality Date  . APPENDECTOMY    . COLONOSCOPY  2008  . KNEE SURGERY    . pilonidal abcess     x 2 surgeries to correct this  . TONSILLECTOMY     Allergies  Allergen Reactions  . Penicillins Rash   Prior to Admission medications   Medication Sig Start Date End Date Taking? Authorizing Provider  Acetaminophen (TYLENOL ARTHRITIS EXT RELIEF PO) Take by mouth.   Yes [provider]  amLODipine (NORVASC) 10 MG tablet Take 1 tablet (10 mg  total) by mouth daily. 04/13/19  Yes Wendie Agreste, MD  doxazosin (CARDURA) 4 MG tablet Take 1 tablet (4 mg total) by mouth daily. 04/13/19  Yes Wendie Agreste, MD  ibuprofen (ADVIL) 200 MG tablet Take 400 mg by mouth every 6 (six) hours as needed.   Yes [provider]  losartan-hydrochlorothiazide (HYZAAR) 100-25 MG tablet Take 1 tablet by mouth once daily 10/04/19  Yes Wendie Agreste, MD  metoprolol succinate (TOPROL-XL) 100 MG 24 hr tablet Take 1 tablet (100 mg total) by mouth daily. 04/13/19  Yes Wendie Agreste, MD   Social History   Socioeconomic History  . Marital status: Married    Spouse name: Not on file  . Number of children: Not on file  . Years of education: Not on file  . Highest education level: Not on file  Occupational History  . Not on file  Tobacco Use  . Smoking status: Former Smoker    Quit date: 02/09/1979    Years since quitting: 40.7  . Smokeless tobacco:  Never Used  Vaping Use  . Vaping Use: Never used  Substance and Sexual Activity  . Alcohol use: Yes    Alcohol/week: 0.0 standard drinks    Comment: occ glass of wine   . Drug use: No  . Sexual activity: Yes  Other Topics Concern  . Not on file  Social History Narrative  . Not on file   Social Determinants of Health   Financial Resource Strain:   . Difficulty of Paying Living Expenses:   Food Insecurity:   . Worried About Charity fundraiser in the Last Year:   . Arboriculturist in the Last Year:   Transportation Needs:   . Film/video editor (Medical):   Marland Kitchen Lack of Transportation (Non-Medical):   Physical Activity:   . Days of Exercise per Week:   . Minutes of Exercise per Session:   Stress:   . Feeling of Stress :   Social Connections:   . Frequency of Communication with Friends and Family:   . Frequency of Social Gatherings with Friends and Family:   . Attends Religious Services:   . Active Member of Clubs or Organizations:   . Attends Archivist Meetings:   Marland Kitchen Marital Status:   Intimate Partner Violence:   . Fear of Current or Ex-Partner:   . Emotionally Abused:   Marland Kitchen Physically Abused:   . Sexually Abused:     Review of Systems  Constitutional: Negative for fatigue and unexpected weight change.  Eyes: Negative for visual disturbance.  Respiratory: Negative for cough, chest tightness and shortness of breath.   Cardiovascular: Negative for chest pain, palpitations and leg swelling.  Gastrointestinal: Negative for abdominal pain and blood in stool.  Neurological: Negative for dizziness, light-headedness and headaches.     Objective:   Vitals:   10/28/19 1117 10/28/19 1121  BP: (!) 154/84 (!) 144/78  Pulse: 89   Resp: 16   Temp: 98.2 F (36.8 C)   TempSrc: Temporal   SpO2: 100%   Weight: (!) 340 lb 9.6 oz (154.5 kg)   Height: 6' (1.829 m)      Physical Exam Vitals reviewed.  Constitutional:      Appearance: He is well-developed.   HENT:     Head: Normocephalic and atraumatic.  Eyes:     Pupils: Pupils are equal, round, and reactive to light.  Neck:     Vascular: No carotid bruit or JVD.  Cardiovascular:     Rate and Rhythm: Normal rate and regular rhythm.     Heart sounds: Normal heart sounds. No murmur heard.   Pulmonary:     Effort: Pulmonary effort is normal.     Breath sounds: Normal breath sounds. No rales.  Skin:    General: Skin is warm and dry.  Neurological:     Mental Status: He is alert and oriented to person, place, and time.        Assessment & Plan:  FORTINO HAAG is a 64 y.o. male . Prediabetes - Plan: Hemoglobin A1c Class 3 severe obesity with body mass index (BMI) of 45.0 to 49.9 in adult, unspecified obesity type, unspecified whether serious comorbidity present (HCC)  -Check A1c.  Continue to work on diet/activity discussed with restart of exercise.  Low intensity exercise recommended including pool-based exercise as possible.  -Options of meeting with medical bariatrics or surgical bariatrics given BMI 46.  Certainly work on diet and exercise initially should be helpful.  Essential hypertension - Plan: amLODipine (NORVASC) 10 MG tablet, doxazosin (CARDURA) 4 MG tablet, losartan-hydrochlorothiazide (HYZAAR) 100-25 MG tablet, metoprolol succinate (TOPROL-XL) 100 MG 24 hr tablet, Comprehensive metabolic panel  -Borderline control but plans on increased exercise.  Dietary advice given, monitor home readings, continue same regimen for now.  Hyperlipidemia, unspecified hyperlipidemia type - Plan: Comprehensive metabolic panel, Lipid panel, atorvastatin (LIPITOR) 10 MG tablet, DISCONTINUED: atorvastatin (LIPITOR) 10 MG tablet  -Elevated ASCVD risk score, previous elevated readings.  Repeat labs, but anticipate need for Lipitor, start 10 mg daily if still elevated, repeat testing in 6 weeks.  Potential side effects, risk of statins discussed.  Meds ordered this encounter  Medications  .  amLODipine (NORVASC) 10 MG tablet    Sig: Take 1 tablet (10 mg total) by mouth daily.    Dispense:  90 tablet    Refill:  1  . doxazosin (CARDURA) 4 MG tablet    Sig: Take 1 tablet (4 mg total) by mouth daily.    Dispense:  90 tablet    Refill:  1  . losartan-hydrochlorothiazide (HYZAAR) 100-25 MG tablet    Sig: Take 1 tablet by mouth daily.    Dispense:  90 tablet    Refill:  1  . metoprolol succinate (TOPROL-XL) 100 MG 24 hr tablet    Sig: Take 1 tablet (100 mg total) by mouth daily.    Dispense:  90 tablet    Refill:  1  . DISCONTD: atorvastatin (LIPITOR) 10 MG tablet    Sig: Take 1 tablet (10 mg total) by mouth daily.    Dispense:  90 tablet    Refill:  0  . atorvastatin (LIPITOR) 10 MG tablet    Sig: Take 1 tablet (10 mg total) by mouth daily.    Dispense:  90 tablet    Refill:  0   Patient Instructions   Restart low impact, low intensity exercise. Pool based exercise may be easier on your knees. I expect your blood pressure to improve with restart of exercise. No changes for now.   I will recheck cholesterol, but can fill Lipitor if still elevated, then follow up in 6 weeks for repeat testing if you do start on that medicine.   Here is number for medical weight loss specialist. I am also happy to refer you to surgery group to discuss surgical options if that is something you are interested in.    Dennard Nip, MD Medical Weight Loss Management  .  [phone]    If you have lab work done today you will be contacted with your lab results within the next 2 weeks.  If you have not heard from [place] then please contact us. The fastest way to get your results is to register for My Chart.   IF you received an x-ray today, you will receive an invoice from [facility]. Please contact [facility] at [phone] with questions or concerns regarding your invoice.   IF you received labwork today, you will receive an invoice from [facility]. Please contact [facility]  at [phone] with questions or concerns regarding your invoice.   Our billing staff will not be able to assist you with questions regarding bills from these companies.  You will be contacted with the lab results as soon as they are available. The fastest way to get your results is to activate your My Chart account. Instructions are located on the last page of this paperwork. If you have not heard from [place] regarding the results in 2 weeks, please contact this office.          Signed, Merri Ray, MD [facility]

## 2019-10-28 NOTE — Patient Instructions (Addendum)
Restart low impact, low intensity exercise. Pool based exercise may be easier on your knees. I expect your blood pressure to improve with restart of exercise. No changes for now.   I will recheck cholesterol, but can fill Lipitor if still elevated, then follow up in 6 weeks for repeat testing if you do start on that medicine.   Here is number for medical weight loss specialist. I am also happy to refer you to surgery group to discuss surgical options if that is something you are interested in.    Dennard Nip, MD Medical Weight Loss Management  . [phone]    If you have lab work done today you will be contacted with your lab results within the next 2 weeks.  If you have not heard from [place] then please contact us. The fastest way to get your results is to register for My Chart.   IF you received an x-ray today, you will receive an invoice from [facility]. Please contact [facility] at [phone] with questions or concerns regarding your invoice.   IF you received labwork today, you will receive an invoice from [facility]. Please contact [facility] at [phone] with questions or concerns regarding your invoice.   Our billing staff will not be able to assist you with questions regarding bills from these companies.  You will be contacted with the lab results as soon as they are available. The fastest way to get your results is to activate your My Chart account. Instructions are located on the last page of this paperwork. If you have not heard from [place] regarding the results in 2 weeks, please contact this office.

## 2019-10-29 LAB — LIPID PANEL
Chol/HDL Ratio: 4.6 ratio (ref 0.0–5.0)
Cholesterol, Total: 182 mg/dL (ref 100–199)
HDL: 40 mg/dL (ref >39)
LDL Chol Calc (NIH): 122 mg/dL — ABNORMAL HIGH (ref 0–99)
Triglycerides: 108 mg/dL (ref 0–149)
VLDL Cholesterol Cal: 20 mg/dL (ref 5–40)

## 2019-10-29 LAB — COMPREHENSIVE METABOLIC PANEL
ALT: 37 IU/L (ref 0–44)
AST: 30 IU/L (ref 0–40)
Albumin/Globulin Ratio: 1.3 (ref 1.2–2.2)
Albumin: 4.4 g/dL (ref 3.8–4.8)
Alkaline Phosphatase: 73 IU/L (ref 48–121)
BUN/Creatinine Ratio: 16 (ref 10–24)
BUN: 15 mg/dL (ref 8–27)
Bilirubin Total: 0.5 mg/dL (ref 0.0–1.2)
CO2: 25 mmol/L (ref 20–29)
Calcium: 9.5 mg/dL (ref 8.6–10.2)
Chloride: 98 mmol/L (ref 96–106)
Creatinine, Ser: 0.92 mg/dL (ref 0.76–1.27)
GFR calc Af Amer: 102 mL/min/{1.73_m2} (ref >59)
GFR calc non Af Amer: 88 mL/min/{1.73_m2} (ref >59)
Globulin, Total: 3.3 g/dL (ref 1.5–4.5)
Glucose: 128 mg/dL — ABNORMAL HIGH (ref 65–99)
Potassium: 4.8 mmol/L (ref 3.5–5.2)
Sodium: 138 mmol/L (ref 134–144)
Total Protein: 7.7 g/dL (ref 6.0–8.5)

## 2019-10-29 LAB — HEMOGLOBIN A1C
Est. average glucose Bld gHb Est-mCnc: 131 mg/dL
Hgb A1c MFr Bld: 6.2 % — ABNORMAL HIGH (ref 4.8–5.6)

## 2020-01-10 ENCOUNTER — Other Ambulatory Visit: Payer: Self-pay | Admitting: Family Medicine

## 2020-01-10 DIAGNOSIS — I10 Essential (primary) hypertension: Secondary | ICD-10-CM

## 2020-01-18 ENCOUNTER — Other Ambulatory Visit: Payer: Self-pay | Admitting: Family Medicine

## 2020-01-18 DIAGNOSIS — I10 Essential (primary) hypertension: Secondary | ICD-10-CM

## 2020-04-08 ENCOUNTER — Other Ambulatory Visit: Payer: Self-pay | Admitting: Family Medicine

## 2020-04-08 DIAGNOSIS — I10 Essential (primary) hypertension: Secondary | ICD-10-CM

## 2020-04-08 NOTE — Telephone Encounter (Signed)
Requested Prescriptions  Pending Prescriptions Disp Refills  . losartan-hydrochlorothiazide (HYZAAR) 100-25 MG tablet [Pharmacy Med Name: Losartan Potassium-HCTZ 100-25 MG Oral Tablet] 90 tablet 0    Sig: Take 1 tablet by mouth once daily     Cardiovascular: ARB + Diuretic Combos Failed - 04/08/2020  9:58 AM      Failed - Last BP in normal range    BP Readings from Last 1 Encounters:  10/28/19 (!) 144/78         Passed - K in normal range and within 180 days    Potassium  Date Value Ref Range Status  10/28/2019 4.8 3.5 - 5.2 mmol/L Final         Passed - Na in normal range and within 180 days    Sodium  Date Value Ref Range Status  10/28/2019 138 134 - 144 mmol/L Final         Passed - Cr in normal range and within 180 days    Creat  Date Value Ref Range Status  04/08/2015 1.04 0.70 - 1.33 mg/dL Final   Creatinine, Ser  Date Value Ref Range Status  10/28/2019 0.92 0.76 - 1.27 mg/dL Final         Passed - Ca in normal range and within 180 days    Calcium  Date Value Ref Range Status  10/28/2019 9.5 8.6 - 10.2 mg/dL Final         Passed - Patient is not pregnant      Passed - Valid encounter within last 6 months    Recent Outpatient Visits          5 months ago Prediabetes   [facility], Ranell Patrick, MD   11 months ago Prediabetes   [facility], Ranell Patrick, MD   12 months ago Essential hypertension   [facility], Ranell Patrick, MD   1 year ago Essential hypertension   [facility], Ranell Patrick, MD   1 year ago Annual physical exam   [facility], Ranell Patrick, MD      Future Appointments            In 3 weeks Wendie Agreste, MD [facility], [facility]           . metoprolol succinate (TOPROL-XL) 100 MG 24 hr tablet [Pharmacy Med Name: Metoprolol Succinate ER 100 MG Oral Tablet Extended Release 24 Hour] 90 tablet 0    Sig: Take 1 tablet by mouth once daily      Cardiovascular:  Beta Blockers Failed - 04/08/2020  9:58 AM      Failed - Last BP in normal range    BP Readings from Last 1 Encounters:  10/28/19 (!) 144/78         Passed - Last Heart Rate in normal range    Pulse Readings from Last 1 Encounters:  10/28/19 89         Passed - Valid encounter within last 6 months    Recent Outpatient Visits          5 months ago Prediabetes   [facility], Ranell Patrick, MD   11 months ago Prediabetes   [facility], Ranell Patrick, MD   12 months ago Essential hypertension   [facility], Ranell Patrick, MD   1 year ago Essential hypertension   [facility],  Ranell Patrick, MD   1 year ago Annual physical exam   [facility], Ranell Patrick, MD      Future Appointments            In 3 weeks Carlota Raspberry Ranell Patrick, MD [facility] [place], [facility]

## 2020-05-02 ENCOUNTER — Ambulatory Visit: Payer: BC Managed Care – PPO | Admitting: Family Medicine

## 2020-05-02 ENCOUNTER — Other Ambulatory Visit: Payer: Self-pay

## 2020-05-02 ENCOUNTER — Encounter: Payer: Self-pay | Admitting: Family Medicine

## 2020-05-02 VITALS — BP 135/82 | HR 108 | Temp 98.2°F | Ht 72.0 in | Wt 327.0 lb

## 2020-05-02 DIAGNOSIS — R7303 Prediabetes: Secondary | ICD-10-CM | POA: Diagnosis not present

## 2020-05-02 DIAGNOSIS — I10 Essential (primary) hypertension: Secondary | ICD-10-CM

## 2020-05-02 DIAGNOSIS — E785 Hyperlipidemia, unspecified: Secondary | ICD-10-CM | POA: Diagnosis not present

## 2020-05-02 DIAGNOSIS — Z23 Encounter for immunization: Secondary | ICD-10-CM

## 2020-05-02 DIAGNOSIS — M25562 Pain in left knee: Secondary | ICD-10-CM

## 2020-05-02 DIAGNOSIS — M25561 Pain in right knee: Secondary | ICD-10-CM

## 2020-05-02 DIAGNOSIS — F439 Reaction to severe stress, unspecified: Secondary | ICD-10-CM

## 2020-05-02 LAB — LIPID PANEL

## 2020-05-02 MED ORDER — LOSARTAN POTASSIUM-HCTZ 100-25 MG PO TABS: 1.0000 | ORAL_TABLET | Freq: Every day | ORAL | 1 refills | Status: DC

## 2020-05-02 MED ORDER — AMLODIPINE BESYLATE 10 MG PO TABS: 10.0000 mg | ORAL_TABLET | Freq: Every day | ORAL | 1 refills | Status: DC

## 2020-05-02 MED ORDER — DOXAZOSIN MESYLATE 4 MG PO TABS: 4.0000 mg | ORAL_TABLET | Freq: Every day | ORAL | 1 refills | Status: DC

## 2020-05-02 MED ORDER — METOPROLOL SUCCINATE ER 100 MG PO TB24: 100.0000 mg | ORAL_TABLET | Freq: Every day | ORAL | 1 refills | Status: DC

## 2020-05-02 NOTE — Patient Instructions (Addendum)
If cholesterol is similar, it may be worth taking the lipitor cholesterol medicine at least a few days per week.   Try to minimize ibuprofen use - tylenol daily if needed for now. Follow up to discuss knees further in next month.   See info on stress below.    Stress, Adult Stress is a normal reaction to life events. Stress is what you feel when life demands more than you are used to, or more than you think you can handle. Some stress can be useful, such as studying for a test or meeting a deadline at work. Stress that occurs too often or for too long can cause problems. It can affect your emotional health and interfere with relationships and normal daily activities. Too much stress can weaken your body's defense system (immune system) and increase your risk for physical illness. If you already have a medical problem, stress can make it worse. What are the causes? All sorts of life events can cause stress. An event that causes stress for one person may not be stressful for another person. Major life events, whether positive or negative, commonly cause stress. Examples include:  Losing a job or starting a new job.  Losing a loved one.  Moving to a new town or home.  Getting married or divorced.  Having a baby.  Getting injured or sick. Less obvious life events can also cause stress, especially if they occur day after day or in combination with each other. Examples include:  Working long hours.  Driving in traffic.  Caring for children.  Being in debt.  Being in a difficult relationship. What are the signs or symptoms? Stress can cause emotional symptoms, including:  Anxiety. This is feeling worried, afraid, on edge, overwhelmed, or out of control.  Anger, including irritation or impatience.  Depression. This is feeling sad, down, helpless, or guilty.  Trouble focusing, remembering, or making decisions. Stress can cause physical symptoms, including:  Aches and pains. These  may affect your head, neck, back, stomach, or other areas of your body.  Tight muscles or a clenched jaw.  Low energy.  Trouble sleeping. Stress can cause unhealthy behaviors, including:  Eating to feel better (overeating) or skipping meals.  Working too much or putting off tasks.  Smoking, drinking alcohol, or using drugs to feel better. How is this diagnosed? Stress is diagnosed through an assessment by your health care provider. He or she may diagnose this condition based on:  Your symptoms and any stressful life events.  Your medical history.  Tests to rule out other causes of your symptoms. Depending on your condition, your health care provider may refer you to a specialist for further evaluation. How is this treated?  Stress management techniques are the recommended treatment for stress. Medicine is not typically recommended for the treatment of stress. Techniques to reduce your reaction to stressful life events include:  Stress identification. Monitor yourself for symptoms of stress and identify what causes stress for you. These skills may help you to avoid or prepare for stressful events.  Time management. Set your priorities, keep a calendar of events, and learn to say no. Taking these actions can help you avoid making too many commitments. Techniques for coping with stress include:  Rethinking the problem. Try to think realistically about stressful events rather than ignoring them or overreacting. Try to find the positives in a stressful situation rather than focusing on the negatives.  Exercise. Physical exercise can release both physical and emotional tension. The  key is to find a form of exercise that you enjoy and do it regularly.  Relaxation techniques. These relax the body and mind. The key is to find one or more that you enjoy and use the techniques regularly. Examples include: ? Meditation, deep breathing, or progressive relaxation techniques. ? Yoga or tai  chi. ? Biofeedback, mindfulness techniques, or journaling. ? Listening to music, being out in nature, or participating in other hobbies.  Practicing a healthy lifestyle. Eat a balanced diet, drink plenty of water, limit or avoid caffeine, and get plenty of sleep.  Having a strong support network. Spend time with family, friends, or other people you enjoy being around. Express your feelings and talk things over with someone you trust. Counseling or talk therapy with a mental health professional may be helpful if you are having trouble managing stress on your own. Follow these instructions at home: Lifestyle   Avoid drugs.  Do not use any products that contain nicotine or tobacco, such as cigarettes, e-cigarettes, and chewing tobacco. If you need help quitting, ask your health care provider.  Limit alcohol intake to no more than 1 drink a day for nonpregnant women and 2 drinks a day for men. One drink equals 12 oz of beer, 5 oz of wine, or 1 oz of hard liquor  Do not use alcohol or drugs to relax.  Eat a balanced diet that includes fresh fruits and vegetables, whole grains, lean meats, fish, eggs, and beans, and low-fat dairy. Avoid processed foods and foods high in added fat, sugar, and salt.  Exercise at least 30 minutes on 5 or more days each week.  Get 7-8 hours of sleep each night. General instructions   Practice stress management techniques as discussed with your health care provider.  Drink enough fluid to keep your urine clear or pale yellow.  Take over-the-counter and prescription medicines only as told by your health care provider.  Keep all follow-up visits as told by your health care provider. This is important. Contact a health care provider if:  Your symptoms get worse.  You have new symptoms.  You feel overwhelmed by your problems and can no longer manage them on your own. Get help right away if:  You have thoughts of hurting yourself or others. If you ever  feel like you may hurt yourself or others, or have thoughts about taking your own life, get help right away. You can go to your nearest emergency department or call:  Your local emergency services (911 in the [place]  A suicide crisis helpline, such as the [facility] at 865-723-6874. This is open 24 hours a day. Summary  Stress is a normal reaction to life events. It can cause problems if it happens too often or for too long.  Practicing stress management techniques is the best way to treat stress.  Counseling or talk therapy with a mental health professional may be helpful if you are having trouble managing stress on your own. This information is not intended to replace advice given to you by your health care provider. Make sure you discuss any questions you have with your health care provider. Document Revised: 11/14/2018 Document Reviewed: 06/06/2016 Elsevier Patient Education  [place] Your Hypertension Hypertension is commonly called high blood pressure. This is when the force of your blood pressing against the walls of your arteries is too strong. Arteries are blood vessels that carry blood from your heart throughout your body. Hypertension forces  the heart to work harder to pump blood, and may cause the arteries to become narrow or stiff. Having untreated or uncontrolled hypertension can cause heart attack, stroke, kidney disease, and other problems. What are blood pressure readings? A blood pressure reading consists of a higher number over a lower number. Ideally, your blood pressure should be below 120/80. The first ("top") number is called the systolic pressure. It is a measure of the pressure in your arteries as your heart beats. The second ("bottom") number is called the diastolic pressure. It is a measure of the pressure in your arteries as the heart relaxes. What does my blood pressure reading mean? Blood pressure is classified into  four stages. Based on your blood pressure reading, your health care provider may use the following stages to determine what type of treatment you need, if any. Systolic pressure and diastolic pressure are measured in a unit called mm Hg. Normal  Systolic pressure: below 174.  Diastolic pressure: below 80. Elevated  Systolic pressure: 944-967.  Diastolic pressure: below 80. Hypertension stage 1  Systolic pressure: 591-638.  Diastolic pressure: 46-65. Hypertension stage 2  Systolic pressure: 993 or above.  Diastolic pressure: 90 or above. What health risks are associated with hypertension? Managing your hypertension is an important responsibility. Uncontrolled hypertension can lead to:  A heart attack.  A stroke.  A weakened blood vessel (aneurysm).  Heart failure.  Kidney damage.  Eye damage.  Metabolic syndrome.  Memory and concentration problems. What changes can I make to manage my hypertension? Hypertension can be managed by making lifestyle changes and possibly by taking medicines. Your health care provider will help you make a plan to bring your blood pressure within a normal range. Eating and drinking   Eat a diet that is high in fiber and potassium, and low in salt (sodium), added sugar, and fat. An example eating plan is called the DASH (Dietary Approaches to Stop Hypertension) diet. To eat this way: ? Eat plenty of fresh fruits and vegetables. Try to fill half of your plate at each meal with fruits and vegetables. ? Eat whole grains, such as whole wheat pasta, brown rice, or whole grain bread. Fill about one quarter of your plate with whole grains. ? Eat low-fat diary products. ? Avoid fatty cuts of meat, processed or cured meats, and poultry with skin. Fill about one quarter of your plate with lean proteins such as fish, chicken without skin, beans, eggs, and tofu. ? Avoid premade and processed foods. These tend to be higher in sodium, added sugar, and  fat.  Reduce your daily sodium intake. Most people with hypertension should eat less than 1,500 mg of sodium a day.  Limit alcohol intake to no more than 1 drink a day for nonpregnant women and 2 drinks a day for men. One drink equals 12 oz of beer, 5 oz of wine, or 1 oz of hard liquor. Lifestyle  Work with your health care provider to maintain a healthy body weight, or to lose weight. Ask what an ideal weight is for you.  Get at least 30 minutes of exercise that causes your heart to beat faster (aerobic exercise) most days of the week. Activities may include walking, swimming, or biking.  Include exercise to strengthen your muscles (resistance exercise), such as weight lifting, as part of your weekly exercise routine. Try to do these types of exercises for 30 minutes at least 3 days a week.  Do not use any products that contain nicotine  or tobacco, such as cigarettes and e-cigarettes. If you need help quitting, ask your health care provider.  Control any long-term (chronic) conditions you have, such as high cholesterol or diabetes. Monitoring  Monitor your blood pressure at home as told by your health care provider. Your personal target blood pressure may vary depending on your medical conditions, your age, and other factors.  Have your blood pressure checked regularly, as often as told by your health care provider. Working with your health care provider  Review all the medicines you take with your health care provider because there may be side effects or interactions.  Talk with your health care provider about your diet, exercise habits, and other lifestyle factors that may be contributing to hypertension.  Visit your health care provider regularly. Your health care provider can help you create and adjust your plan for managing hypertension. Will I need medicine to control my blood pressure? Your health care provider may prescribe medicine if lifestyle changes are not enough to get  your blood pressure under control, and if:  Your systolic blood pressure is 130 or higher.  Your diastolic blood pressure is 80 or higher. Take medicines only as told by your health care provider. Follow the directions carefully. Blood pressure medicines must be taken as prescribed. The medicine does not work as well when you skip doses. Skipping doses also puts you at risk for problems. Contact a health care provider if:  You think you are having a reaction to medicines you have taken.  You have repeated (recurrent) headaches.  You feel dizzy.  You have swelling in your ankles.  You have trouble with your vision. Get help right away if:  You develop a severe headache or confusion.  You have unusual weakness or numbness, or you feel faint.  You have severe pain in your chest or abdomen.  You vomit repeatedly.  You have trouble breathing. Summary  Hypertension is when the force of blood pumping through your arteries is too strong. If this condition is not controlled, it may put you at risk for serious complications.  Your personal target blood pressure may vary depending on your medical conditions, your age, and other factors. For most people, a normal blood pressure is less than 120/80.  Hypertension is managed by lifestyle changes, medicines, or both. Lifestyle changes include weight loss, eating a healthy, low-sodium diet, exercising more, and limiting alcohol. This information is not intended to replace advice given to you by your health care provider. Make sure you discuss any questions you have with your health care provider. Document Revised: 08/08/2018 Document Reviewed: 03/14/2016 Elsevier Patient Education  [employer].    If you have lab work done today you will be contacted with your lab results within the next 2 weeks.  If you have not heard from [place] then please contact us. The fastest way to get your results is to register for My Chart.   IF you received  an x-ray today, you will receive an invoice from [facility]. Please contact [facility] at [phone] with questions or concerns regarding your invoice.   IF you received labwork today, you will receive an invoice from [place]. Please contact [facility] at [phone] with questions or concerns regarding your invoice.   Our billing staff will not be able to assist you with questions regarding bills from these companies.  You will be contacted with the lab results as soon as they are available. The fastest way to get your results is  to activate your My Chart account. Instructions are located on the last page of this paperwork. If you have not heard from [place] regarding the results in 2 weeks, please contact this office.

## 2020-05-02 NOTE — Progress Notes (Signed)
Subjective:  Patient ID: Zachary [name], male    DOB: 1955-06-25  Age: 65 y.o. MRN: [id number]  CC:  Chief Complaint  Patient presents with  . Follow-up    On prediabetes,hypertension, and hyperlipidemia. Pt reports when he checks his BP at home it runs around 130/85. Pt reports no physical symptoms of these conditions.pt reports when he checks his SG at home it has been running 138m/dl fasting. Pt reports doing his best to get back in the gym. Pt also reports having inflammation in both his L and R knees. Pt reports no change to his diet. Pt is wondering if he needs the shingles vaccine.  . Stress    Pt reports he is feeling stress at home with work and worrying about his wife. Pt is concerned about this stress.    HPI Zachary [name] for    Hypertension: Losartan HCT is the 100/25 mg daily, Toprol-XL 100 mg daily, amlodipine 10 mg daily, doxazosin 4 mg daily. No recent missed doses or new side effects.   Home readings: 130/85 BP Readings from Last 3 Encounters:  05/02/20 135/82  10/28/19 (!) 144/78  11/24/18 (!) 114/54   Lab Results  Component Value Date   CREATININE 0.92 10/28/2019   Hyperlipidemia: No current statin.  Father had MI in 784's was a smoker.  The 10-year ASCVD risk score (Mikey BussingDC JBrooke Bonito, et al., 2013) is: 17.1%   Values used to calculate the score:     Age: [age]years     Sex: Male     Is Non-Hispanic African American: Yes     Diabetic: No     Tobacco smoker: No     Systolic Blood Pressure: 1915mmHg     Is BP treated: Yes     HDL Cholesterol: 40 mg/dL     Total Cholesterol: 182 mg/dL  Lab Results  Component Value Date   CHOL 182 10/28/2019   HDL 40 10/28/2019   LDLCALC 122 (H) 10/28/2019   TRIG 108 10/28/2019   CHOLHDL 4.6 10/28/2019   Lab Results  Component Value Date   ALT 37 10/28/2019   AST 30 10/28/2019   ALKPHOS 73 10/28/2019   BILITOT 0.5 10/28/2019    Prediabetes: Weight improved since June last year.  Has been cutting  back on portions, some exercise at gym. Rowing machine.  Lab Results  Component Value Date   HGBA1C 6.2 (H) 10/28/2019   Wt Readings from Last 3 Encounters:  05/02/20 (!) 327 lb (148.3 kg)  10/28/19 (!) 340 lb 9.6 oz (154.5 kg)  11/24/18 (!) 330 lb (149.7 kg)    Situational stress: Stress with wife, she is a p[employer] park manager She is under a lo of demands, concerned about effect on her, and stress with his job. Has 2 jobs. 7a-9p. Gym/exercise helps when he has time.   Knee pain: Surgery on left knee in college, min stiffness on left, sore at times in R knee/inflammation at times. Taking ibuprofen daily - helps pain. Not sure if tylenol arthritis helped- initially helps, then less - ibuprofen.   Flu vaccine today.  Shingles vaccine today.  Had covid booster in November.   History Patient Active Problem List   Diagnosis Date Noted  . OBESITY 05/09/2009  . Essential hypertension 05/09/2009  . HEMOCCULT POSITIVE STOOL 05/09/2009  . HEART MURMUR, HX OF 05/09/2009   Past Medical History:  Diagnosis Date  . Allergy   . Arthritis   . Asthma   . Blood  transfusion without reported diagnosis    as child  . Constipation   . Heart murmur    mild  . Hyperlipidemia    slight elevation  . Hypertension    Past Surgical History:  Procedure Laterality Date  . APPENDECTOMY    . COLONOSCOPY  2008  . KNEE SURGERY    . pilonidal abcess     x 2 surgeries to correct this  . TONSILLECTOMY     Allergies  Allergen Reactions  . Penicillins Rash   Prior to Admission medications   Medication Sig Start Date End Date Taking? Authorizing Provider  Acetaminophen (TYLENOL ARTHRITIS EXT RELIEF PO) Take by mouth.    [provider]  amLODipine (NORVASC) 10 MG tablet Take 1 tablet (10 mg total) by mouth daily. 10/28/19   Wendie Agreste, MD  atorvastatin (LIPITOR) 10 MG tablet Take 1 tablet (10 mg total) by mouth daily. 10/28/19   Wendie Agreste, MD  doxazosin (CARDURA) 4 MG tablet Take 1  tablet (4 mg total) by mouth daily. 10/28/19   Wendie Agreste, MD  ibuprofen (ADVIL) 200 MG tablet Take 400 mg by mouth every 6 (six) hours as needed.    [provider]  losartan-hydrochlorothiazide Konrad Penta) 100-25 MG tablet Take 1 tablet by mouth once daily 04/08/20   Wendie Agreste, MD  metoprolol succinate (TOPROL-XL) 100 MG 24 hr tablet Take 1 tablet by mouth once daily 04/08/20   Wendie Agreste, MD   Social History   Socioeconomic History  . Marital status: Married    Spouse name: Not on file  . Number of children: Not on file  . Years of education: Not on file  . Highest education level: Not on file  Occupational History  . Not on file  Tobacco Use  . Smoking status: Former Smoker    Quit date: 02/09/1979    Years since quitting: 41.2  . Smokeless tobacco: Never Used  Vaping Use  . Vaping Use: Never used  Substance and Sexual Activity  . Alcohol use: Yes    Alcohol/week: 0.0 standard drinks    Comment: occ glass of wine   . Drug use: No  . Sexual activity: Yes  Other Topics Concern  . Not on file  Social History Narrative  . Not on file   Social Determinants of Health   Financial Resource Strain: Not on file  Food Insecurity: Not on file  Transportation Needs: Not on file  Physical Activity: Not on file  Stress: Not on file  Social Connections: Not on file  Intimate Partner Violence: Not on file    Review of Systems  Constitutional: Negative for fatigue and unexpected weight change.  Eyes: Negative for visual disturbance.  Respiratory: Negative for cough, chest tightness and shortness of breath.   Cardiovascular: Negative for chest pain, palpitations and leg swelling.  Gastrointestinal: Negative for abdominal pain and blood in stool.  Neurological: Negative for dizziness, light-headedness and headaches.   Depression screen [facility] 2/9 05/02/2020 10/28/2019 04/13/2019 10/06/2018 06/13/2018  Decreased Interest 0 0 - 0 0  Down, Depressed, Hopeless 0 0  0 0 0  PHQ - 2 Score 0 0 0 0 0   Immunization History  Administered Date(s) Administered  . Influenza, Seasonal, Injecte, Preservative Fre 05/24/2012  . Influenza,inj,Quad PF,6+ Mos 02/21/2013, 03/20/2014, 01/20/2018, 04/17/2019  . PFIZER SARS-COV-2 Vaccination 07/15/2019, 08/09/2019  . Tdap 03/25/2017     Objective:   Vitals:   05/02/20 0818 05/02/20 0819  BP: Marland Kitchen)  152/89 135/82  Pulse: (!) 108   Temp: 98.2 F (36.8 C)   TempSrc: Temporal   SpO2: 95%   Weight: (!) 327 lb (148.3 kg)   Height: 6' (1.829 m)      Physical Exam Vitals reviewed.  Constitutional:      Appearance: He is well-developed and well-nourished. He is obese.  HENT:     Head: Normocephalic and atraumatic.  Eyes:     Extraocular Movements: EOM normal.     Pupils: Pupils are equal, round, and reactive to light.  Neck:     Vascular: No carotid bruit or JVD.  Cardiovascular:     Rate and Rhythm: Normal rate and regular rhythm.     Heart sounds: Normal heart sounds. No murmur heard.   Pulmonary:     Effort: Pulmonary effort is normal.     Breath sounds: Normal breath sounds. No rales.  Musculoskeletal:        General: No edema.     Comments: Bilateral knees full range of motion, some crepitus.  No focal bony tenderness  Skin:    General: Skin is warm and dry.  Neurological:     Mental Status: He is alert and oriented to person, place, and time.  Psychiatric:        Mood and Affect: Mood and affect normal.    33 minutes spent during visit, greater than 50% counseling and assimilation of information, chart review, and discussion of plan.    Assessment & Plan:  Zachary [name] is a 65 y.o. male . Essential hypertension - Plan: Comprehensive metabolic panel  - borderline control. Continue same regimen, commended on weight loss.  Anticipate improved control as weight continues to decline  Need for vaccination - Plan: Varicella-zoster vaccine IM, Flu Vaccine QUAD 36+ mos IM  Prediabetes - Plan:  Hemoglobin A1c  -Commended on weight loss.  Hyperlipidemia, unspecified hyperlipidemia type - Plan: Lipid panel  -Discussed ASCVD risk score, and family history of heart disease in his father although not early disease.  Would consider statin at least a few days per week but will check labs first.  Situational stress  -Handout given, option of counseling discussed, follow-up in 1 month  Pain in both knees, unspecified chronicity  -Tylenol preferable with history of prediabetes, hypertension, hyperlipidemia.  Intermittent ibuprofen only if needed.  Plan for follow-up in 1 month to evaluate knees further.  Sooner if worse.   Flu vaccine, shingles vaccine given. No orders of the defined types were placed in this encounter.  Patient Instructions   If cholesterol is similar, it may be worth taking the lipitor cholesterol medicine at least a few days per week.   Try to minimize ibuprofen use - tylenol daily if needed for now. Follow up to discuss knees further in next month.   See info on stress below.    Stress, Adult Stress is a normal reaction to life events. Stress is what you feel when life demands more than you are used to, or more than you think you can handle. Some stress can be useful, such as studying for a test or meeting a deadline at work. Stress that occurs too often or for too long can cause problems. It can affect your emotional health and interfere with relationships and normal daily activities. Too much stress can weaken your body's defense system (immune system) and increase your risk for physical illness. If you already have a medical problem, stress can make it worse. What are the causes?  All sorts of life events can cause stress. An event that causes stress for one person may not be stressful for another person. Major life events, whether positive or negative, commonly cause stress. Examples include:  Losing a job or starting a new job.  Losing a loved one.  Moving  to a new town or home.  Getting married or divorced.  Having a baby.  Getting injured or sick. Less obvious life events can also cause stress, especially if they occur day after day or in combination with each other. Examples include:  Working long hours.  Driving in traffic.  Caring for children.  Being in debt.  Being in a difficult relationship. What are the signs or symptoms? Stress can cause emotional symptoms, including:  Anxiety. This is feeling worried, afraid, on edge, overwhelmed, or out of control.  Anger, including irritation or impatience.  Depression. This is feeling sad, down, helpless, or guilty.  Trouble focusing, remembering, or making decisions. Stress can cause physical symptoms, including:  Aches and pains. These may affect your head, neck, back, stomach, or other areas of your body.  Tight muscles or a clenched jaw.  Low energy.  Trouble sleeping. Stress can cause unhealthy behaviors, including:  Eating to feel better (overeating) or skipping meals.  Working too much or putting off tasks.  Smoking, drinking alcohol, or using drugs to feel better. How is this diagnosed? Stress is diagnosed through an assessment by your health care provider. He or she may diagnose this condition based on:  Your symptoms and any stressful life events.  Your medical history.  Tests to rule out other causes of your symptoms. Depending on your condition, your health care provider may refer you to a specialist for further evaluation. How is this treated?  Stress management techniques are the recommended treatment for stress. Medicine is not typically recommended for the treatment of stress. Techniques to reduce your reaction to stressful life events include:  Stress identification. Monitor yourself for symptoms of stress and identify what causes stress for you. These skills may help you to avoid or prepare for stressful events.  Time management. Set your  priorities, keep a calendar of events, and learn to say no. Taking these actions can help you avoid making too many commitments. Techniques for coping with stress include:  Rethinking the problem. Try to think realistically about stressful events rather than ignoring them or overreacting. Try to find the positives in a stressful situation rather than focusing on the negatives.  Exercise. Physical exercise can release both physical and emotional tension. The key is to find a form of exercise that you enjoy and do it regularly.  Relaxation techniques. These relax the body and mind. The key is to find one or more that you enjoy and use the techniques regularly. Examples include: ? Meditation, deep breathing, or progressive relaxation techniques. ? Yoga or tai chi. ? Biofeedback, mindfulness techniques, or journaling. ? Listening to music, being out in nature, or participating in other hobbies.  Practicing a healthy lifestyle. Eat a balanced diet, drink plenty of water, limit or avoid caffeine, and get plenty of sleep.  Having a strong support network. Spend time with family, friends, or other people you enjoy being around. Express your feelings and talk things over with someone you trust. Counseling or talk therapy with a mental health professional may be helpful if you are having trouble managing stress on your own. Follow these instructions at home: Lifestyle   Avoid drugs.  Do not use  any products that contain nicotine or tobacco, such as cigarettes, e-cigarettes, and chewing tobacco. If you need help quitting, ask your health care provider.  Limit alcohol intake to no more than 1 drink a day for nonpregnant women and 2 drinks a day for men. One drink equals 12 oz of beer, 5 oz of wine, or 1 oz of hard liquor  Do not use alcohol or drugs to relax.  Eat a balanced diet that includes fresh fruits and vegetables, whole grains, lean meats, fish, eggs, and beans, and low-fat dairy. Avoid  processed foods and foods high in added fat, sugar, and salt.  Exercise at least 30 minutes on 5 or more days each week.  Get 7-8 hours of sleep each night. General instructions   Practice stress management techniques as discussed with your health care provider.  Drink enough fluid to keep your urine clear or pale yellow.  Take over-the-counter and prescription medicines only as told by your health care provider.  Keep all follow-up visits as told by your health care provider. This is important. Contact a health care provider if:  Your symptoms get worse.  You have new symptoms.  You feel overwhelmed by your problems and can no longer manage them on your own. Get help right away if:  You have thoughts of hurting yourself or others. If you ever feel like you may hurt yourself or others, or have thoughts about taking your own life, get help right away. You can go to your nearest emergency department or call:  Your local emergency services (911 in the [place]  A suicide crisis helpline, such as the [place] at 8585270963. This is open 24 hours a day. Summary  Stress is a normal reaction to life events. It can cause problems if it happens too often or for too long.  Practicing stress management techniques is the best way to treat stress.  Counseling or talk therapy with a mental health professional may be helpful if you are having trouble managing stress on your own. This information is not intended to replace advice given to you by your health care provider. Make sure you discuss any questions you have with your health care provider. Document Revised: 11/14/2018 Document Reviewed: 06/06/2016 Elsevier Patient Education  [place] Your Hypertension Hypertension is commonly called high blood pressure. This is when the force of your blood pressing against the walls of your arteries is too strong. Arteries are blood vessels that  carry blood from your heart throughout your body. Hypertension forces the heart to work harder to pump blood, and may cause the arteries to become narrow or stiff. Having untreated or uncontrolled hypertension can cause heart attack, stroke, kidney disease, and other problems. What are blood pressure readings? A blood pressure reading consists of a higher number over a lower number. Ideally, your blood pressure should be below 120/80. The first ("top") number is called the systolic pressure. It is a measure of the pressure in your arteries as your heart beats. The second ("bottom") number is called the diastolic pressure. It is a measure of the pressure in your arteries as the heart relaxes. What does my blood pressure reading mean? Blood pressure is classified into four stages. Based on your blood pressure reading, your health care provider may use the following stages to determine what type of treatment you need, if any. Systolic pressure and diastolic pressure are measured in a unit called mm Hg. Normal  Systolic pressure:  below 120.  Diastolic pressure: below 80. Elevated  Systolic pressure: 086-578.  Diastolic pressure: below 80. Hypertension stage 1  Systolic pressure: 469-629.  Diastolic pressure: 52-84. Hypertension stage 2  Systolic pressure: 132 or above.  Diastolic pressure: 90 or above. What health risks are associated with hypertension? Managing your hypertension is an important responsibility. Uncontrolled hypertension can lead to:  A heart attack.  A stroke.  A weakened blood vessel (aneurysm).  Heart failure.  Kidney damage.  Eye damage.  Metabolic syndrome.  Memory and concentration problems. What changes can I make to manage my hypertension? Hypertension can be managed by making lifestyle changes and possibly by taking medicines. Your health care provider will help you make a plan to bring your blood pressure within a normal range. Eating and  drinking   Eat a diet that is high in fiber and potassium, and low in salt (sodium), added sugar, and fat. An example eating plan is called the DASH (Dietary Approaches to Stop Hypertension) diet. To eat this way: ? Eat plenty of fresh fruits and vegetables. Try to fill half of your plate at each meal with fruits and vegetables. ? Eat whole grains, such as whole wheat pasta, brown rice, or whole grain bread. Fill about one quarter of your plate with whole grains. ? Eat low-fat diary products. ? Avoid fatty cuts of meat, processed or cured meats, and poultry with skin. Fill about one quarter of your plate with lean proteins such as fish, chicken without skin, beans, eggs, and tofu. ? Avoid premade and processed foods. These tend to be higher in sodium, added sugar, and fat.  Reduce your daily sodium intake. Most people with hypertension should eat less than 1,500 mg of sodium a day.  Limit alcohol intake to no more than 1 drink a day for nonpregnant women and 2 drinks a day for men. One drink equals 12 oz of beer, 5 oz of wine, or 1 oz of hard liquor. Lifestyle  Work with your health care provider to maintain a healthy body weight, or to lose weight. Ask what an ideal weight is for you.  Get at least 30 minutes of exercise that causes your heart to beat faster (aerobic exercise) most days of the week. Activities may include walking, swimming, or biking.  Include exercise to strengthen your muscles (resistance exercise), such as weight lifting, as part of your weekly exercise routine. Try to do these types of exercises for 30 minutes at least 3 days a week.  Do not use any products that contain nicotine or tobacco, such as cigarettes and e-cigarettes. If you need help quitting, ask your health care provider.  Control any long-term (chronic) conditions you have, such as high cholesterol or diabetes. Monitoring  Monitor your blood pressure at home as told by your health care provider. Your  personal target blood pressure may vary depending on your medical conditions, your age, and other factors.  Have your blood pressure checked regularly, as often as told by your health care provider. Working with your health care provider  Review all the medicines you take with your health care provider because there may be side effects or interactions.  Talk with your health care provider about your diet, exercise habits, and other lifestyle factors that may be contributing to hypertension.  Visit your health care provider regularly. Your health care provider can help you create and adjust your plan for managing hypertension. Will I need medicine to control my blood pressure? Your health care  provider may prescribe medicine if lifestyle changes are not enough to get your blood pressure under control, and if:  Your systolic blood pressure is 130 or higher.  Your diastolic blood pressure is 80 or higher. Take medicines only as told by your health care provider. Follow the directions carefully. Blood pressure medicines must be taken as prescribed. The medicine does not work as well when you skip doses. Skipping doses also puts you at risk for problems. Contact a health care provider if:  You think you are having a reaction to medicines you have taken.  You have repeated (recurrent) headaches.  You feel dizzy.  You have swelling in your ankles.  You have trouble with your vision. Get help right away if:  You develop a severe headache or confusion.  You have unusual weakness or numbness, or you feel faint.  You have severe pain in your chest or abdomen.  You vomit repeatedly.  You have trouble breathing. Summary  Hypertension is when the force of blood pumping through your arteries is too strong. If this condition is not controlled, it may put you at risk for serious complications.  Your personal target blood pressure may vary depending on your medical conditions, your age, and  other factors. For most people, a normal blood pressure is less than 120/80.  Hypertension is managed by lifestyle changes, medicines, or both. Lifestyle changes include weight loss, eating a healthy, low-sodium diet, exercising more, and limiting alcohol. This information is not intended to replace advice given to you by your health care provider. Make sure you discuss any questions you have with your health care provider. Document Revised: 08/08/2018 Document Reviewed: 03/14/2016 Elsevier Patient Education  [employer].    If you have lab work done today you will be contacted with your lab results within the next 2 weeks.  If you have not heard from [place] then please contact us. The fastest way to get your results is to register for My Chart.   IF you received an x-ray today, you will receive an invoice from [facility]. Please contact [facility] at [phone] with questions or concerns regarding your invoice.   IF you received labwork today, you will receive an invoice from [facility]. Please contact [facility] at [phone] with questions or concerns regarding your invoice.   Our billing staff will not be able to assist you with questions regarding bills from these companies.  You will be contacted with the lab results as soon as they are available. The fastest way to get your results is to activate your My Chart account. Instructions are located on the last page of this paperwork. If you have not heard from [place] regarding the results in 2 weeks, please contact this office.         Signed, Merri Ray, MD [facility]

## 2020-05-03 LAB — HEMOGLOBIN A1C
Est. average glucose Bld gHb Est-mCnc: 137 mg/dL
Hgb A1c MFr Bld: 6.4 % — ABNORMAL HIGH (ref 4.8–5.6)

## 2020-05-03 LAB — COMPREHENSIVE METABOLIC PANEL
ALT: 30 IU/L (ref 0–44)
AST: 20 IU/L (ref 0–40)
Albumin/Globulin Ratio: 1.3 (ref 1.2–2.2)
Albumin: 4.3 g/dL (ref 3.8–4.8)
Alkaline Phosphatase: 66 IU/L (ref 44–121)
BUN/Creatinine Ratio: 14 (ref 10–24)
BUN: 13 mg/dL (ref 8–27)
Bilirubin Total: 0.5 mg/dL (ref 0.0–1.2)
CO2: 21 mmol/L (ref 20–29)
Calcium: 9.4 mg/dL (ref 8.6–10.2)
Chloride: 98 mmol/L (ref 96–106)
Creatinine, Ser: 0.9 mg/dL (ref 0.76–1.27)
GFR calc Af Amer: 105 mL/min/{1.73_m2} (ref >59)
GFR calc non Af Amer: 91 mL/min/{1.73_m2} (ref >59)
Globulin, Total: 3.2 g/dL (ref 1.5–4.5)
Glucose: 139 mg/dL — ABNORMAL HIGH (ref 65–99)
Potassium: 4 mmol/L (ref 3.5–5.2)
Sodium: 135 mmol/L (ref 134–144)
Total Protein: 7.5 g/dL (ref 6.0–8.5)

## 2020-05-03 LAB — LIPID PANEL
Chol/HDL Ratio: 4.3 ratio (ref 0.0–5.0)
Cholesterol, Total: 185 mg/dL (ref 100–199)
HDL: 43 mg/dL (ref >39)
LDL Chol Calc (NIH): 126 mg/dL — ABNORMAL HIGH (ref 0–99)
Triglycerides: 88 mg/dL (ref 0–149)
VLDL Cholesterol Cal: 16 mg/dL (ref 5–40)

## 2020-06-06 ENCOUNTER — Ambulatory Visit: Payer: BC Managed Care – PPO | Admitting: Family Medicine

## 2020-07-08 ENCOUNTER — Other Ambulatory Visit: Payer: Self-pay | Admitting: Family Medicine

## 2020-07-08 DIAGNOSIS — I10 Essential (primary) hypertension: Secondary | ICD-10-CM

## 2020-07-31 ENCOUNTER — Other Ambulatory Visit: Payer: Self-pay | Admitting: Family Medicine

## 2020-07-31 DIAGNOSIS — I10 Essential (primary) hypertension: Secondary | ICD-10-CM

## 2020-07-31 NOTE — Telephone Encounter (Signed)
Routing to Dr Carlota Raspberry

## 2020-10-17 ENCOUNTER — Other Ambulatory Visit: Payer: Self-pay | Admitting: Family Medicine

## 2020-10-17 DIAGNOSIS — I10 Essential (primary) hypertension: Secondary | ICD-10-CM

## 2021-01-15 ENCOUNTER — Other Ambulatory Visit: Payer: Self-pay | Admitting: Family Medicine

## 2021-01-15 DIAGNOSIS — I10 Essential (primary) hypertension: Secondary | ICD-10-CM

## 2021-01-17 NOTE — Telephone Encounter (Signed)
Pt in need of an appointment for future refills can send in temporary after he is scheduled

## 2021-01-18 NOTE — Telephone Encounter (Signed)
Appt made Friday 09/23

## 2021-01-20 ENCOUNTER — Encounter: Payer: Self-pay | Admitting: Family Medicine

## 2021-01-20 ENCOUNTER — Ambulatory Visit: Payer: BC Managed Care – PPO | Admitting: Family Medicine

## 2021-01-20 ENCOUNTER — Other Ambulatory Visit: Payer: Self-pay

## 2021-01-20 VITALS — BP 148/90 | HR 109 | Ht 72.0 in | Wt 279.0 lb

## 2021-01-20 DIAGNOSIS — R7303 Prediabetes: Secondary | ICD-10-CM | POA: Diagnosis not present

## 2021-01-20 DIAGNOSIS — I10 Essential (primary) hypertension: Secondary | ICD-10-CM | POA: Diagnosis not present

## 2021-01-20 DIAGNOSIS — E785 Hyperlipidemia, unspecified: Secondary | ICD-10-CM

## 2021-01-20 DIAGNOSIS — Z23 Encounter for immunization: Secondary | ICD-10-CM

## 2021-01-20 LAB — COMPREHENSIVE METABOLIC PANEL
ALT: 26 U/L (ref 0–53)
AST: 21 U/L (ref 0–37)
Albumin: 4.4 g/dL (ref 3.5–5.2)
Alkaline Phosphatase: 69 U/L (ref 39–117)
BUN: 15 mg/dL (ref 6–23)
CO2: 30 mEq/L (ref 19–32)
Calcium: 9.8 mg/dL (ref 8.4–10.5)
Chloride: 96 mEq/L (ref 96–112)
Creatinine, Ser: 0.88 mg/dL (ref 0.40–1.50)
GFR: 90.91 mL/min (ref >60.00)
Glucose, Bld: 128 mg/dL — ABNORMAL HIGH (ref 70–99)
Potassium: 4.4 mEq/L (ref 3.5–5.1)
Sodium: 135 mEq/L (ref 135–145)
Total Bilirubin: 0.6 mg/dL (ref 0.2–1.2)
Total Protein: 7.9 g/dL (ref 6.0–8.3)

## 2021-01-20 LAB — LIPID PANEL
Cholesterol: 187 mg/dL (ref 0–200)
HDL: 40.7 mg/dL (ref >39.00)
LDL Cholesterol: 130 mg/dL — ABNORMAL HIGH (ref 0–99)
NonHDL: 146.29
Total CHOL/HDL Ratio: 5
Triglycerides: 79 mg/dL (ref 0.0–149.0)
VLDL: 15.8 mg/dL (ref 0.0–40.0)

## 2021-01-20 LAB — HEMOGLOBIN A1C: Hgb A1c MFr Bld: 6 % (ref 4.6–6.5)

## 2021-01-20 MED ORDER — ATORVASTATIN CALCIUM 10 MG PO TABS: 10.0000 mg | ORAL_TABLET | Freq: Every day | ORAL | 1 refills | Status: DC

## 2021-01-20 MED ORDER — AMLODIPINE BESYLATE 10 MG PO TABS: 10.0000 mg | ORAL_TABLET | Freq: Every day | ORAL | 1 refills | Status: DC

## 2021-01-20 MED ORDER — LOSARTAN POTASSIUM-HCTZ 100-25 MG PO TABS: 1.0000 | ORAL_TABLET | Freq: Every day | ORAL | 1 refills | Status: DC

## 2021-01-20 MED ORDER — DOXAZOSIN MESYLATE 4 MG PO TABS: 4.0000 mg | ORAL_TABLET | Freq: Every day | ORAL | 1 refills | Status: DC

## 2021-01-20 MED ORDER — METOPROLOL SUCCINATE ER 100 MG PO TB24: 100.0000 mg | ORAL_TABLET | Freq: Every day | ORAL | 1 refills | Status: DC

## 2021-01-20 NOTE — Progress Notes (Signed)
Subjective:  Patient ID: Zachary Maker., male    DOB: 1955-05-08  Age: 65 y.o. MRN: [id number]  CC:  Chief Complaint  Patient presents with   Hypertension    HPI Zachary [name]. presents for   Hypertension: Last visit in January.  Recommended decreased NSAID use at that time.  Commended on the weight loss at that time.  Was continued on losartan HCTZ 100/25 mg daily, doxazosin 4 mg daily, amlodipine 10 mg daily, Toprol-XL 100 mg daily. Sometimes misses doxazosin. Amlodipine (evening meds).   Home readings: BP Readings from Last 3 Encounters:  01/20/21 (!) 148/90  05/02/20 135/82  10/28/19 (!) 144/78   Lab Results  Component Value Date   CREATININE 0.90 05/02/2020    Hyperlipidemia: With elevated ASCVD risk score, father did have MI in his 15s.  Recommended statin last visit, at least a few days per week.  Lipitor 10 mg.  Not taking lipitor - does not have.   Lab Results  Component Value Date   CHOL 185 05/02/2020   HDL 43 05/02/2020   LDLCALC 126 (H) 05/02/2020   TRIG 88 05/02/2020   CHOLHDL 4.3 05/02/2020   Lab Results  Component Value Date   ALT 30 05/02/2020   AST 20 05/02/2020   ALKPHOS 66 05/02/2020   BILITOT 0.5 05/02/2020    Prediabetes: Significant weight loss since last visit. Cutting out starches. Adjusting diet, eliminating sodas.  Exercise - minimal. Has not returned to gym. Prior knee injury and some limitations with knee pain - no recent injury - plans to follow up to discuss.  Lab Results  Component Value Date   HGBA1C 6.4 (H) 05/02/2020   Wt Readings from Last 3 Encounters:  01/20/21 279 lb (126.6 kg)  05/02/20 (!) 327 lb (148.3 kg)  10/28/19 (!) 340 lb 9.6 oz (154.5 kg)   Shingles vaccine and covid booster at [facility] - few months ago.  Flu vaccine today.   History Patient Active Problem List   Diagnosis Date Noted   OBESITY 05/09/2009   Essential hypertension 05/09/2009   HEMOCCULT POSITIVE STOOL 05/09/2009   HEART MURMUR, HX  OF 05/09/2009   Past Medical History:  Diagnosis Date   Allergy    Arthritis    Asthma    Blood transfusion without reported diagnosis    as child   Constipation    Heart murmur    mild   Hyperlipidemia    slight elevation   Hypertension    Past Surgical History:  Procedure Laterality Date   APPENDECTOMY     COLONOSCOPY  2008   KNEE SURGERY     pilonidal abcess     x 2 surgeries to correct this   TONSILLECTOMY     Allergies  Allergen Reactions   Penicillins Rash   Prior to Admission medications   Medication Sig Start Date End Date Taking? Authorizing Provider  Acetaminophen (TYLENOL ARTHRITIS EXT RELIEF PO) Take by mouth.   Yes [provider]  amLODipine (NORVASC) 10 MG tablet Take 1 tablet by mouth once daily 10/17/20  Yes Wendie Agreste, MD  atorvastatin (LIPITOR) 10 MG tablet Take 1 tablet (10 mg total) by mouth daily. 10/28/19  Yes Wendie Agreste, MD  doxazosin (CARDURA) 4 MG tablet Take 1 tablet by mouth once daily 08/02/20  Yes Wendie Agreste, MD  ibuprofen (ADVIL) 200 MG tablet Take 400 mg by mouth every 6 (six) hours as needed.   Yes [provider]  losartan-hydrochlorothiazide (HYZAAR) 100-25 MG tablet Take 1 tablet by mouth once daily 07/08/20  Yes Wendie Agreste, MD  metoprolol succinate (TOPROL-XL) 100 MG 24 hr tablet Take 1 tablet (100 mg total) by mouth daily. Take with or immediately following a meal. 05/02/20  Yes Wendie Agreste, MD   Social History   Socioeconomic History   Marital status: Married    Spouse name: Not on file   Number of children: Not on file   Years of education: Not on file   Highest education level: Not on file  Occupational History   Not on file  Tobacco Use   Smoking status: Former    Types: Cigarettes    Quit date: 02/09/1979    Years since quitting: 41.9   Smokeless tobacco: Never  Vaping Use   Vaping Use: Never used  Substance and Sexual Activity   Alcohol use: Yes    Alcohol/week: 0.0  standard drinks    Comment: occ glass of wine    Drug use: No   Sexual activity: Yes  Other Topics Concern   Not on file  Social History Narrative   Not on file   Social Determinants of Health   Financial Resource Strain: Not on file  Food Insecurity: Not on file  Transportation Needs: Not on file  Physical Activity: Not on file  Stress: Not on file  Social Connections: Not on file  Intimate Partner Violence: Not on file    Review of Systems  Constitutional:  Negative for fatigue and unexpected weight change.  Eyes:  Negative for visual disturbance.  Respiratory:  Negative for cough, chest tightness and shortness of breath.   Cardiovascular:  Negative for chest pain, palpitations and leg swelling.  Gastrointestinal:  Negative for abdominal pain and blood in stool.  Neurological:  Negative for dizziness, light-headedness and headaches.    Objective:   Vitals:   01/20/21 1023  BP: (!) 148/90  Pulse: (!) 109  SpO2: 98%  Weight: 279 lb (126.6 kg)  Height: 6' (1.829 m)     Physical Exam Vitals reviewed.  Constitutional:      Appearance: He is well-developed.  HENT:     Head: Normocephalic and atraumatic.  Neck:     Vascular: No carotid bruit or JVD.  Cardiovascular:     Rate and Rhythm: Normal rate and regular rhythm.     Heart sounds: Normal heart sounds. No murmur heard. Pulmonary:     Effort: Pulmonary effort is normal.     Breath sounds: Normal breath sounds. No rales.  Musculoskeletal:     Right lower leg: No edema.     Left lower leg: No edema.  Skin:    General: Skin is warm and dry.  Neurological:     Mental Status: He is alert and oriented to person, place, and time.  Psychiatric:        Mood and Affect: Mood normal.     Assessment & Plan:  Zachary Donoghue. is a 65 y.o. male . Essential hypertension - Plan: amLODipine (NORVASC) 10 MG tablet, losartan-hydrochlorothiazide (HYZAAR) 100-25 MG tablet, metoprolol succinate (TOPROL-XL) 100 MG 24 hr  tablet, doxazosin (CARDURA) 4 MG tablet, Comprehensive metabolic panel  -Controlled, likely due to missed evening doses of meds.  Discussed adding an alarm as a reminder, no med changes for now, plan for follow-up in next few weeks to review knee pain and can reassess blood pressure at that time.  Labs as above.  Hyperlipidemia, unspecified hyperlipidemia type -  Plan: atorvastatin (LIPITOR) 10 MG tablet, Hemoglobin A1c, Comprehensive metabolic panel, Lipid panel  -With prior elevated ASCVD risk score, recommended daily Lipitor, initially start few days per week to make sure it is tolerated.  Baseline lipid panel today.  Follow-up few weeks.  Needs flu shot - Plan: Flu Vaccine QUAD 6+ mos PF IM (Fluarix Quad PF)  Prediabetes - Plan: Hemoglobin A1c  -Commended on his impressive weight loss since last visit, and significant changes in diet.  Anticipate A1c will be improved, and will follow-up to discuss his knee issues and other limitations for exercise as that will also complement his weight loss.  Need for immunization against influenza - Plan: Flu Vaccine QUAD 68mo+IM (Fluarix, Fluzone & Alfiuria Quad PF)   Meds ordered this encounter  Medications   amLODipine (NORVASC) 10 MG tablet    Sig: Take 1 tablet (10 mg total) by mouth daily.    Dispense:  90 tablet    Refill:  1   losartan-hydrochlorothiazide (HYZAAR) 100-25 MG tablet    Sig: Take 1 tablet by mouth daily.    Dispense:  90 tablet    Refill:  1   metoprolol succinate (TOPROL-XL) 100 MG 24 hr tablet    Sig: Take 1 tablet (100 mg total) by mouth daily. Take with or immediately following a meal.    Dispense:  90 tablet    Refill:  1   atorvastatin (LIPITOR) 10 MG tablet    Sig: Take 1 tablet (10 mg total) by mouth daily.    Dispense:  90 tablet    Refill:  1    Can start few days per week initially, then up to daily if tolerated.   doxazosin (CARDURA) 4 MG tablet    Sig: Take 1 tablet (4 mg total) by mouth daily.    Dispense:   90 tablet    Refill:  1   Patient Instructions  Try starting lipitor at least a few days per week - up to daily if tolerated.  Great work on the weigh loss! Your labs will likely be better.  Try setting alarm for evening BP meds to help remember. No med changes at this time.  Follow up in next few weeks and we can review labs and knee pain at that time.  Thanks for coming in today.       Signed,   Merri Ray, MD [place] 01/20/21 11:41 AM

## 2021-01-20 NOTE — Patient Instructions (Addendum)
Try starting lipitor at least a few days per week - up to daily if tolerated.  Great work on the weigh loss! Your labs will likely be better.  Try setting alarm for evening BP meds to help remember. No med changes at this time.  Follow up in next few weeks and we can review labs and knee pain at that time.  Thanks for coming in today.

## 2021-04-07 ENCOUNTER — Other Ambulatory Visit: Payer: Self-pay | Admitting: Family Medicine

## 2021-04-07 DIAGNOSIS — I10 Essential (primary) hypertension: Secondary | ICD-10-CM

## 2021-06-14 ENCOUNTER — Ambulatory Visit: Payer: Self-pay

## 2021-06-14 ENCOUNTER — Other Ambulatory Visit: Payer: Self-pay

## 2021-06-14 ENCOUNTER — Ambulatory Visit: Payer: BC Managed Care – PPO | Admitting: Orthopaedic Surgery

## 2021-06-14 ENCOUNTER — Encounter: Payer: Self-pay | Admitting: Orthopaedic Surgery

## 2021-06-14 VITALS — Ht 72.0 in | Wt 280.0 lb

## 2021-06-14 DIAGNOSIS — M25561 Pain in right knee: Secondary | ICD-10-CM | POA: Diagnosis not present

## 2021-06-14 DIAGNOSIS — G8929 Other chronic pain: Secondary | ICD-10-CM | POA: Diagnosis not present

## 2021-06-14 DIAGNOSIS — M25562 Pain in left knee: Secondary | ICD-10-CM

## 2021-06-14 NOTE — Progress Notes (Signed)
Office Visit Note   Patient: Zachary [name].           Date of Birth: 14-Oct-1955           MRN: [id number] Visit Date: 06/14/2021              Requested by: Wendie Agreste, MD [address] [place],  [place] [id number] PCP: Wendie Agreste, MD   Assessment & Plan: Visit Diagnoses:  1. Chronic pain of left knee   2. Chronic pain of right knee     Plan: I spoke to him in length in detail about knee replacement surgery for his left knee.  I feel that can be the best option for him and he agrees with this based on his clinical exam findings and x-ray findings.  At this point no conservative treatment is going to help.  I did talk in length in detail about knee replacement surgery in terms of the risks and benefits of surgery and what to expect with an intraoperative and postoperative course.  All questions and concerns were answered addressed.  We will work on getting this scheduled.  Follow-Up Instructions: Return for 2 weeks post-op.   Orders:  Orders Placed This Encounter  Procedures   XR Knee 1-2 Views Right   XR Knee 1-2 Views Left   No orders of the defined types were placed in this encounter.     Procedures: No procedures performed   Clinical Data: No additional findings.   Subjective: Chief Complaint  Patient presents with   Left Knee - Pain   Right Knee - Pain  The patient is a very pleasant 66 year old gentleman who comes in with bilateral knee pain this been going on for many years now.  His left knee hurts worse than his right knee.  Both knees swell.  He actually sustained a severe ligamentous injury to his left knee when he was in college.  He was playing football.  He had to have a ligamentous reconstruction of his knee at that point.  Both knees do hurt on a daily basis and they are detrimentally affecting his mobility, his quality of life and his actives daily living.  He is not a diabetic.  He does have high blood pressure.  His BMI is almost 38.   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  HPI  Review of Systems There is currently listed no headache, chest pain, shortness of breath, fever, chills, nausea, vomiting  Objective: Vital Signs: Ht 6' (1.829 m)    Wt 280 lb (127 kg)    BMI 37.97 kg/m   Physical Exam He is alert and orient x3 and in no acute distress Ortho Exam Examination of both knees shows varus malalignment of both knees that is not really correctable.  There is no effusion today with either knee.  Both knees have significant patellofemoral crepitation throughout the arc of motion.  Both knees have quite significant medial and lateral joint line tenderness. Specialty Comments:  No specialty comments available.  Imaging: XR Knee 1-2 Views Left  Result Date: 06/14/2021 2 views of the left knee show severe end-stage arthritis with complete loss of the almost entire joint space.  There are osteophytes in all 3 compartments and significant varus malalignment.  XR Knee 1-2 Views Right  Result Date: 06/14/2021 2 views of the right knee show severe end-stage arthritis with varus malalignment, near bone-on-bone wear of the medial compartment and  osteophytes in all 3 compartments with patellofemoral narrowing as well.    PMFS History: Patient Active Problem List   Diagnosis Date Noted   OBESITY 05/09/2009   Essential hypertension 05/09/2009   HEMOCCULT POSITIVE STOOL 05/09/2009   HEART MURMUR, HX OF 05/09/2009   Past Medical History:  Diagnosis Date   Allergy    Arthritis    Asthma    Blood transfusion without reported diagnosis    as child   Constipation    Heart murmur    mild   Hyperlipidemia    slight elevation   Hypertension     Family History  Problem Relation Age of Onset   Hypertension Mother    Stroke Father    Colon polyps Sister    Colon cancer Maternal Grandmother        in her 36's   Colon cancer Maternal Uncle    Liver cancer Maternal Uncle    Esophageal cancer Neg Hx     Rectal cancer Neg Hx    Stomach cancer Neg Hx     Past Surgical History:  Procedure Laterality Date   APPENDECTOMY     COLONOSCOPY  2008   KNEE SURGERY     pilonidal abcess     x 2 surgeries to correct this   TONSILLECTOMY     Social History   Occupational History   Not on file  Tobacco Use   Smoking status: Former    Types: Cigarettes    Quit date: 02/09/1979    Years since quitting: 42.3   Smokeless tobacco: Never  Vaping Use   Vaping Use: Never used  Substance and Sexual Activity   Alcohol use: Yes    Alcohol/week: 0.0 standard drinks    Comment: occ glass of wine    Drug use: No   Sexual activity: Yes

## 2021-06-23 ENCOUNTER — Ambulatory Visit
Admission: EM | Admit: 2021-06-23 | Discharge: 2021-06-23 | Disposition: A | Discharge status: Other Acute Inpatient Hospital | Payer: BC Managed Care – PPO | Attending: Internal Medicine | Admitting: Internal Medicine

## 2021-06-23 ENCOUNTER — Emergency Department (HOSPITAL_COMMUNITY): Payer: BC Managed Care – PPO

## 2021-06-23 ENCOUNTER — Encounter (HOSPITAL_COMMUNITY): Payer: Self-pay | Admitting: Emergency Medicine

## 2021-06-23 ENCOUNTER — Other Ambulatory Visit: Payer: Self-pay

## 2021-06-23 ENCOUNTER — Emergency Department (HOSPITAL_COMMUNITY)
Admission: EM | Admit: 2021-06-23 | Discharge: 2021-06-23 | Disposition: A | Discharge status: Home / Self Care | Payer: BC Managed Care – PPO | Attending: Emergency Medicine | Admitting: Emergency Medicine

## 2021-06-23 DIAGNOSIS — R0789 Other chest pain: Secondary | ICD-10-CM

## 2021-06-23 DIAGNOSIS — Z79899 Other long term (current) drug therapy: Secondary | ICD-10-CM | POA: Diagnosis not present

## 2021-06-23 DIAGNOSIS — I10 Essential (primary) hypertension: Secondary | ICD-10-CM | POA: Insufficient documentation

## 2021-06-23 DIAGNOSIS — R9431 Abnormal electrocardiogram [ECG] [EKG]: Secondary | ICD-10-CM | POA: Diagnosis not present

## 2021-06-23 DIAGNOSIS — I4510 Unspecified right bundle-branch block: Secondary | ICD-10-CM | POA: Diagnosis not present

## 2021-06-23 DIAGNOSIS — K219 Gastro-esophageal reflux disease without esophagitis: Secondary | ICD-10-CM | POA: Diagnosis not present

## 2021-06-23 DIAGNOSIS — R079 Chest pain, unspecified: Secondary | ICD-10-CM | POA: Diagnosis not present

## 2021-06-23 DIAGNOSIS — R109 Unspecified abdominal pain: Secondary | ICD-10-CM | POA: Diagnosis not present

## 2021-06-23 DIAGNOSIS — M79603 Pain in arm, unspecified: Secondary | ICD-10-CM | POA: Diagnosis not present

## 2021-06-23 LAB — CBC WITH DIFFERENTIAL/PLATELET
Abs Immature Granulocytes: 0.02 10*3/uL (ref 0.00–0.07)
Basophils Absolute: 0 10*3/uL (ref 0.0–0.1)
Basophils Relative: 0 %
Eosinophils Absolute: 0.1 10*3/uL (ref 0.0–0.5)
Eosinophils Relative: 1 %
HCT: 46.8 % (ref 39.0–52.0)
Hemoglobin: 15.3 g/dL (ref 13.0–17.0)
Immature Granulocytes: 0 %
Lymphocytes Relative: 18 %
Lymphs Abs: 1.5 10*3/uL (ref 0.7–4.0)
MCH: 28.6 pg (ref 26.0–34.0)
MCHC: 32.7 g/dL (ref 30.0–36.0)
MCV: 87.5 fL (ref 80.0–100.0)
Monocytes Absolute: 0.7 10*3/uL (ref 0.1–1.0)
Monocytes Relative: 8 %
Neutro Abs: 5.8 10*3/uL (ref 1.7–7.7)
Neutrophils Relative %: 73 %
Platelets: 186 10*3/uL (ref 150–400)
RBC: 5.35 MIL/uL (ref 4.22–5.81)
RDW: 14 % (ref 11.5–15.5)
WBC: 8.1 10*3/uL (ref 4.0–10.5)
nRBC: 0 % (ref 0.0–0.2)

## 2021-06-23 LAB — BASIC METABOLIC PANEL
Anion gap: 11 (ref 5–15)
BUN: 13 mg/dL (ref 8–23)
CO2: 25 mmol/L (ref 22–32)
Calcium: 9.4 mg/dL (ref 8.9–10.3)
Chloride: 100 mmol/L (ref 98–111)
Creatinine, Ser: 0.82 mg/dL (ref 0.61–1.24)
GFR, Estimated: >60 mL/min (ref >60)
Glucose, Bld: 133 mg/dL — ABNORMAL HIGH (ref 70–99)
Potassium: 3.4 mmol/L — ABNORMAL LOW (ref 3.5–5.1)
Sodium: 136 mmol/L (ref 135–145)

## 2021-06-23 LAB — TROPONIN I (HIGH SENSITIVITY): Troponin I (High Sensitivity): 4 ng/L (ref <18)

## 2021-06-23 NOTE — ED Provider Notes (Signed)
[facility]    CSN: [id number] Arrival date & time: 06/23/21  1714      History   Chief Complaint Chief Complaint  Patient presents with   Gastroesophageal Reflux    HPI Zachary [name]. is a 66 y.o. male.   Patient presents with chest pain.  Reports that it started last night and is located in the center of his chest.  Pain does radiate down left arm.  He attributed his symptoms originally to indigestion so he took an antacid with minimal improvement.  Denies any associated shortness of breath, headache, dizziness, nausea, vomiting.  Does take medication for high blood pressure and has been taking as prescribed.   Gastroesophageal Reflux   Past Medical History:  Diagnosis Date   Allergy    Arthritis    Asthma    Blood transfusion without reported diagnosis    as child   Constipation    Heart murmur    mild   Hyperlipidemia    slight elevation   Hypertension     Patient Active Problem List   Diagnosis Date Noted   OBESITY 05/09/2009   Essential hypertension 05/09/2009   HEMOCCULT POSITIVE STOOL 05/09/2009   HEART MURMUR, HX OF 05/09/2009    Past Surgical History:  Procedure Laterality Date   APPENDECTOMY     COLONOSCOPY  2008   KNEE SURGERY     pilonidal abcess     x 2 surgeries to correct this   TONSILLECTOMY         Home Medications    Prior to Admission medications   Medication Sig Start Date End Date Taking? Authorizing Provider  Acetaminophen (TYLENOL ARTHRITIS EXT RELIEF PO) Take by mouth.    [provider]  amLODipine (NORVASC) 10 MG tablet Take 1 tablet (10 mg total) by mouth daily. 01/20/21   Wendie Agreste, MD  atorvastatin (LIPITOR) 10 MG tablet Take 1 tablet (10 mg total) by mouth daily. 01/20/21   Wendie Agreste, MD  doxazosin (CARDURA) 4 MG tablet Take 1 tablet (4 mg total) by mouth daily. 01/20/21   Wendie Agreste, MD  ibuprofen (ADVIL) 200 MG tablet Take 400 mg by mouth every 6 (six) hours as needed.     [provider]  losartan-hydrochlorothiazide (HYZAAR) 100-25 MG tablet Take 1 tablet by mouth daily. 01/20/21   Wendie Agreste, MD  metoprolol succinate (TOPROL-XL) 100 MG 24 hr tablet Take 1 tablet (100 mg total) by mouth daily. Take with or immediately following a meal. 01/20/21   Wendie Agreste, MD    Family History Family History  Problem Relation Age of Onset   Hypertension Mother    Stroke Father    Colon polyps Sister    Colon cancer Maternal Grandmother        in her 68's   Colon cancer Maternal Uncle    Liver cancer Maternal Uncle    Esophageal cancer Neg Hx    Rectal cancer Neg Hx    Stomach cancer Neg Hx     Social History Social History   Tobacco Use   Smoking status: Former    Types: Cigarettes    Quit date: 02/09/1979    Years since quitting: 42.3   Smokeless tobacco: Never  Vaping Use   Vaping Use: Never used  Substance Use Topics   Alcohol use: Yes    Alcohol/week: 0.0 standard drinks    Comment: occ glass of wine    Drug use: No  Allergies   Penicillins   Review of Systems Review of Systems Per HPI  Physical Exam Triage Vital Signs ED Triage Vitals [06/23/21 1848]  Enc Vitals Group     BP (!) 173/91     Pulse Rate (!) 114     Resp 18     Temp 98.1 F (36.7 C)     Temp Source Oral     SpO2 95 %     Weight      Height      Head Circumference      Peak Flow      Pain Score 0     Pain Loc      Pain Edu?      Excl. in [place]?    No data found.  Updated Vital Signs BP (!) 173/91 (BP Location: Left Arm)    Pulse (!) 114    Temp 98.1 F (36.7 C) (Oral)    Resp 18    SpO2 95%   Visual Acuity Right Eye Distance:   Left Eye Distance:   Bilateral Distance:    Right Eye Near:   Left Eye Near:    Bilateral Near:     Physical Exam Constitutional:      General: He is not in acute distress.    Appearance: Normal appearance. He is not toxic-appearing or diaphoretic.  HENT:     Head: Normocephalic and atraumatic.   Eyes:     Extraocular Movements: Extraocular movements intact.     Conjunctiva/sclera: Conjunctivae normal.  Cardiovascular:     Rate and Rhythm: Normal rate and regular rhythm.     Pulses: Normal pulses.     Heart sounds: Normal heart sounds.  Pulmonary:     Effort: Pulmonary effort is normal. No respiratory distress.     Breath sounds: Normal breath sounds.  Neurological:     General: No focal deficit present.     Mental Status: He is alert and oriented to person, place, and time. Mental status is at baseline.  Psychiatric:        Mood and Affect: Mood normal.        Behavior: Behavior normal.        Thought Content: Thought content normal.        Judgment: Judgment normal.     UC Treatments / Results  Labs (all labs ordered are listed, but only abnormal results are displayed) Labs Reviewed - No data to display  EKG   Radiology No results found.  Procedures Procedures (including critical care time)  Medications Ordered in UC Medications - No data to display  Initial Impression / Assessment and Plan / UC Course  I have reviewed the triage vital signs and the nursing notes.  Pertinent labs & imaging results that were available during my care of the patient were reviewed by me and considered in my medical decision making (see chart for details).     EKG showing several abnormalities including bifascicular block.  I do think that patient needs a more extensive evaluation given patient's history, physical exam, elevated blood pressure, elevated heart rate to rule out cardiac etiology.  Patient was advised that it would best to go to the hospital via EMS given vital signs and EKG findings.  Patient was agreeable with plan and left via EMS. Final Clinical Impressions(s) / UC Diagnoses   Final diagnoses:  Other chest pain     Discharge Instructions      Patient sent to hospital via EMS.  ED Prescriptions   None    PDMP not reviewed this encounter.    Teodora Medici, [place] 06/23/21 1950

## 2021-06-23 NOTE — ED Notes (Signed)
Ems contacted  ?

## 2021-06-23 NOTE — ED Triage Notes (Signed)
Pt c/o indigestion onset last night. States took an antacid last night with mild relief.

## 2021-06-23 NOTE — ED Notes (Signed)
Ems at bedside  

## 2021-06-23 NOTE — ED Provider Notes (Signed)
[facility] EMERGENCY DEPARTMENT  Provider Note  CSN: [id number] Arrival date & time: 06/23/21 2024  History Chief Complaint  Patient presents with   Abdominal Pain    Zachary [name]. is a 66 y.o. male with history of HTN, HLD but no DM or FHx of early heart disease reports onset of burning pain in mid chest last night he attributed to GERD, improved some with Tums but still present this morning. He has also had several weeks of unrelated, intermittent sharp pain in L forearm, does not correlated with his chest discomfort. He is currently asymptomatic but he began searching the internet regarding his symptoms and then discussing with wife who reports her father died from massive MI in his sleep and she convinced him to be evaluation. He was initially seen at [facility] and directed to the ED for further evaluation   Home Medications Prior to Admission medications   Medication Sig Start Date End Date Taking? Authorizing Provider  Acetaminophen (TYLENOL ARTHRITIS EXT RELIEF PO) Take by mouth.    [provider]  amLODipine (NORVASC) 10 MG tablet Take 1 tablet (10 mg total) by mouth daily. 01/20/21   Wendie Agreste, MD  atorvastatin (LIPITOR) 10 MG tablet Take 1 tablet (10 mg total) by mouth daily. 01/20/21   Wendie Agreste, MD  doxazosin (CARDURA) 4 MG tablet Take 1 tablet (4 mg total) by mouth daily. 01/20/21   Wendie Agreste, MD  ibuprofen (ADVIL) 200 MG tablet Take 400 mg by mouth every 6 (six) hours as needed.    [provider]  losartan-hydrochlorothiazide (HYZAAR) 100-25 MG tablet Take 1 tablet by mouth daily. 01/20/21   Wendie Agreste, MD  metoprolol succinate (TOPROL-XL) 100 MG 24 hr tablet Take 1 tablet (100 mg total) by mouth daily. Take with or immediately following a meal. 01/20/21   Wendie Agreste, MD     Allergies    Penicillins   Review of Systems   Review of Systems Please see HPI for pertinent positives and  negatives  Physical Exam BP (!) 152/89 (BP Location: Left Arm)    Pulse 94    Temp 98.3 F (36.8 C) (Oral)    Resp 16    Ht 6' (1.829 m)    Wt 127 kg    SpO2 100%    BMI 37.97 kg/m   Physical Exam Vitals and nursing note reviewed.  Constitutional:      Appearance: Normal appearance.  HENT:     Head: Normocephalic and atraumatic.     Nose: Nose normal.     Mouth/Throat:     Mouth: Mucous membranes are moist.  Eyes:     Extraocular Movements: Extraocular movements intact.     Conjunctiva/sclera: Conjunctivae normal.  Cardiovascular:     Rate and Rhythm: Normal rate.  Pulmonary:     Effort: Pulmonary effort is normal.     Breath sounds: Normal breath sounds.  Abdominal:     General: Abdomen is flat.     Palpations: Abdomen is soft.     Tenderness: There is no abdominal tenderness.  Musculoskeletal:        General: No swelling. Normal range of motion.     Cervical back: Neck supple.  Skin:    General: Skin is warm and dry.  Neurological:     General: No focal deficit present.     Mental Status: He is alert.  Psychiatric:        Mood and  Affect: Mood normal.    ED Results / Procedures / Treatments   EKG EKG Interpretation  Date/Time:  Friday June 23 2021 20:52:35 EST Ventricular Rate:  88 PR Interval:  183 QRS Duration: 130 QT Interval:  372 QTC Calculation: 451 R Axis:   -66 Text Interpretation: Sinus rhythm Left bundle branch block No significant change since last tracing EARLIER SAME DATE however LBBB is new from 2007 Confirmed by Calvert Cantor [phone]) on 06/23/2021 9:12:59 PM  Procedures Procedures  Medications Ordered in the ED Medications - No data to display  Initial Impression and Plan  Patient with atypical chest discomfort. EKG is nondiagnostic. Will check labs, including single trop given duration of symptoms and CXR.   ED Course   Clinical Course as of 06/23/21 2249  Fri Jun 23, 2021  2109 I personally viewed the images from radiology  studies and agree with radiologist interpretation: CXR is clear  [CS]  2138 CBC is normal.  [CS]  2219 BMP and Trop are normal.  [CS]  2247 Patient remains asymptomatic, he would like to go home. Recommend he continue with his home meds, follow up with PCP. RTED if symptoms return or for any other concerns.  [CS]    Clinical Course User Index [CS] Truddie Hidden, MD     MDM Rules/Calculators/A&P Medical Decision Making Given presenting complaint, I considered that admission might be necessary. After review of results from ED lab and/or imaging studies, admission to the hospital is not indicated at this time.    Amount and/or Complexity of Data Reviewed External Data Reviewed: ECG and notes.    Details: From UC Labs: ordered. Decision-making details documented in ED Course. Radiology: ordered and independent interpretation performed. Decision-making details documented in ED Course. ECG/medicine tests: ordered and independent interpretation performed. Decision-making details documented in ED Course.  Risk Decision regarding hospitalization.    Final Clinical Impression(s) / ED Diagnoses Final diagnoses:  Gastroesophageal reflux disease without esophagitis  Atypical chest pain    Rx / DC Orders ED Discharge Orders     None        Truddie Hidden, MD 06/23/21 2249

## 2021-06-23 NOTE — ED Triage Notes (Signed)
EMS arrival. Patient came from Urgent Care for indigestion and nerve pain when he lays on left side.AxO.

## 2021-06-23 NOTE — Discharge Instructions (Signed)
Patient sent to hospital via EMS.  

## 2021-07-13 ENCOUNTER — Other Ambulatory Visit: Payer: Self-pay

## 2021-07-14 ENCOUNTER — Other Ambulatory Visit: Payer: Self-pay | Admitting: Family Medicine

## 2021-07-14 DIAGNOSIS — I10 Essential (primary) hypertension: Secondary | ICD-10-CM

## 2021-07-14 DIAGNOSIS — E785 Hyperlipidemia, unspecified: Secondary | ICD-10-CM

## 2021-09-11 ENCOUNTER — Encounter: Payer: BC Managed Care – PPO | Admitting: Orthopaedic Surgery

## 2021-09-22 ENCOUNTER — Telehealth: Payer: Self-pay | Admitting: Orthopaedic Surgery

## 2021-09-22 NOTE — Telephone Encounter (Signed)
Pt called requesting a call back. Pt states he need a letter excusing him from jury duty for [employer]. Please call pt when letter is ready Please call pt at [phone]

## 2021-09-26 NOTE — Telephone Encounter (Signed)
LMOM for patient of the below message  

## 2021-10-10 ENCOUNTER — Other Ambulatory Visit: Payer: Self-pay | Admitting: Family Medicine

## 2021-10-10 DIAGNOSIS — E785 Hyperlipidemia, unspecified: Secondary | ICD-10-CM

## 2021-10-10 DIAGNOSIS — I10 Essential (primary) hypertension: Secondary | ICD-10-CM

## 2021-10-11 ENCOUNTER — Other Ambulatory Visit: Payer: Self-pay | Admitting: Physician Assistant

## 2021-10-11 DIAGNOSIS — M1712 Unilateral primary osteoarthritis, left knee: Secondary | ICD-10-CM

## 2021-10-25 ENCOUNTER — Encounter (HOSPITAL_COMMUNITY): Payer: Self-pay

## 2021-10-25 ENCOUNTER — Encounter (HOSPITAL_COMMUNITY)
Admission: RE | Admit: 2021-10-25 | Discharge: 2021-10-25 | Disposition: A | Discharge status: Home / Self Care | Payer: BC Managed Care – PPO | Source: Ambulatory Visit | Attending: Orthopaedic Surgery | Admitting: Orthopaedic Surgery

## 2021-10-25 ENCOUNTER — Other Ambulatory Visit: Payer: Self-pay

## 2021-10-25 VITALS — BP 151/85 | HR 135 | Temp 98.5°F | Resp 20 | Ht 73.0 in | Wt 290.0 lb

## 2021-10-25 DIAGNOSIS — Z01818 Encounter for other preprocedural examination: Secondary | ICD-10-CM

## 2021-10-25 DIAGNOSIS — M1712 Unilateral primary osteoarthritis, left knee: Secondary | ICD-10-CM | POA: Diagnosis not present

## 2021-10-25 DIAGNOSIS — I10 Essential (primary) hypertension: Secondary | ICD-10-CM | POA: Diagnosis not present

## 2021-10-25 LAB — BASIC METABOLIC PANEL
Anion gap: 10 (ref 5–15)
BUN: 20 mg/dL (ref 8–23)
CO2: 26 mmol/L (ref 22–32)
Calcium: 9.8 mg/dL (ref 8.9–10.3)
Chloride: 103 mmol/L (ref 98–111)
Creatinine, Ser: 1.04 mg/dL (ref 0.61–1.24)
GFR, Estimated: >60 mL/min (ref >60)
Glucose, Bld: 175 mg/dL — ABNORMAL HIGH (ref 70–99)
Potassium: 3.7 mmol/L (ref 3.5–5.1)
Sodium: 139 mmol/L (ref 135–145)

## 2021-10-25 LAB — CBC
HCT: 47.8 % (ref 39.0–52.0)
Hemoglobin: 15.9 g/dL (ref 13.0–17.0)
MCH: 29.2 pg (ref 26.0–34.0)
MCHC: 33.3 g/dL (ref 30.0–36.0)
MCV: 87.7 fL (ref 80.0–100.0)
Platelets: 201 10*3/uL (ref 150–400)
RBC: 5.45 MIL/uL (ref 4.22–5.81)
RDW: 13.5 % (ref 11.5–15.5)
WBC: 7.5 10*3/uL (ref 4.0–10.5)
nRBC: 0 % (ref 0.0–0.2)

## 2021-10-25 LAB — SURGICAL PCR SCREEN
MRSA, PCR: NEGATIVE
Staphylococcus aureus: NEGATIVE

## 2021-10-25 NOTE — Progress Notes (Addendum)
Anesthesia note:  Bowel prep reminder:NA  PCP - Dr. Jacklyn Shell Cardiologist -none Other-   Chest x-ray - 06/23/21-epic EKG - 10/25/21 Stress Test - no ECHO - no Cardiac Cath - NA  Pacemaker/ICD device last checked:NA  Sleep Study - yes negative CPAP - no  Pt is pre diabetic-NA Fasting Blood Sugar -  Checks Blood Sugar _____  Blood Thinner:NA Blood Thinner Instructions: Aspirin Instructions: Last Dose:  Anesthesia review: yes  Patient denies shortness of breath, fever, cough and chest pain at PAT appointment Pt is very nervious about the surgery His HR was 135 with BP 165/103. Repeated after the visit and BP was 151/85 but HR was 125-130. EKG was done.Pt feels deconditioned and is over weight and gets SOB when walking up hill. He reports a mild heart murmur but has never had an ECHO done. HR with EKG was 114.  Patient verbalized understanding of instructions that were given to them at the PAT appointment. Patient was also instructed that they will need to review over the PAT instructions again at home before surgery. yes

## 2021-11-02 DIAGNOSIS — M1712 Unilateral primary osteoarthritis, left knee: Secondary | ICD-10-CM

## 2021-11-02 NOTE — H&P (Signed)
TOTAL KNEE ADMISSION H&P  Patient is being admitted for left total knee arthroplasty.  Subjective:  Chief Complaint:left knee pain.  HPI: Zachary [name]., 66 y.o. male, has a history of pain and functional disability in the left knee due to arthritis and has failed non-surgical conservative treatments for greater than 12 weeks to includeNSAID's and/or analgesics, corticosteriod injections, viscosupplementation injections, flexibility and strengthening excercises, use of assistive devices, weight reduction as appropriate, and activity modification.  Onset of symptoms was gradual, starting >10 years ago with gradually worsening course since that time. The patient noted prior procedures on the knee to include  arthroscopy on the left knee(s).  Patient currently rates pain in the left knee(s) at 10 out of 10 with activity. Patient has night pain, worsening of pain with activity and weight bearing, pain that interferes with activities of daily living, pain with passive range of motion, crepitus, and joint swelling.  Patient has evidence of subchondral sclerosis, periarticular osteophytes, and joint space narrowing by imaging studies. There is no active infection.  Patient Active Problem List   Diagnosis Date Noted   Unilateral primary osteoarthritis, left knee 11/02/2021   OBESITY 05/09/2009   Essential hypertension 05/09/2009   HEMOCCULT POSITIVE STOOL 05/09/2009   HEART MURMUR, HX OF 05/09/2009   Past Medical History:  Diagnosis Date   Allergy    Arthritis    Asthma    Blood transfusion without reported diagnosis    as child   Constipation    Heart murmur    mild   Hyperlipidemia    slight elevation   Hypertension     Past Surgical History:  Procedure Laterality Date   APPENDECTOMY     age 57   COLONOSCOPY  2008   KNEE SURGERY Left 1982   pilonidal abcess     x 2 surgeries to correct this   TONSILLECTOMY     as a child    No current facility-administered medications for  this encounter.   Current Outpatient Medications  Medication Sig Dispense Refill Last Dose   acetaminophen (TYLENOL) 650 MG CR tablet Take 1,300 mg by mouth every 8 (eight) hours as needed for pain.      amLODipine (NORVASC) 10 MG tablet Take 1 tablet by mouth once daily (Patient taking differently: Take 10 mg by mouth at bedtime.) 90 tablet 0    atorvastatin (LIPITOR) 10 MG tablet TAKE 1 TABLET BY MOUTH ONCE DAILY. CAN START FEW DAYS PER WEEK INITIALLY, THEN UP TO DAILY IF TOLERATED 90 tablet 0    diclofenac Sodium (VOLTAREN) 1 % GEL Apply 1 Application topically 3 (three) times daily as needed (pain).      doxazosin (CARDURA) 4 MG tablet Take 1 tablet by mouth once daily (Patient taking differently: Take 4 mg by mouth at bedtime.) 90 tablet 0    ibuprofen (ADVIL) 200 MG tablet Take 400 mg by mouth every 6 (six) hours as needed for moderate pain.      losartan-hydrochlorothiazide (HYZAAR) 100-25 MG tablet Take 1 tablet by mouth once daily 90 tablet 0    metoprolol succinate (TOPROL-XL) 100 MG 24 hr tablet TAKE 1 TABLET BY MOUTH ONCE DAILY. TAKE WITH OR IMMEDIATELY FOLLOWING A MEAL 90 tablet 0    oxymetazoline (VICKS SINEX SEVERE DECONGEST) 0.05 % nasal spray Place 1 spray into both nostrils 2 (two) times daily as needed for congestion.      Allergies  Allergen Reactions   Penicillins Rash    Social History  Tobacco Use   Smoking status: Former    Packs/day: 0.50    Years: 10.00    Total pack years: 5.00    Types: Cigarettes    Quit date: 02/09/1979    Years since quitting: 42.7   Smokeless tobacco: Never  Substance Use Topics   Alcohol use: Yes    Alcohol/week: 0.0 standard drinks of alcohol    Comment: occ glass of wine     Family History  Problem Relation Age of Onset   Hypertension Mother    Stroke Father    Colon polyps Sister    Colon cancer Maternal Grandmother        in her 42's   Colon cancer Maternal Uncle    Liver cancer Maternal Uncle    Esophageal cancer Neg  Hx    Rectal cancer Neg Hx    Stomach cancer Neg Hx      Review of Systems  Musculoskeletal:  Positive for gait problem and joint swelling.  All other systems reviewed and are negative.   Objective:  Physical Exam Vitals reviewed.  Constitutional:      Appearance: Normal appearance.  HENT:     Head: Normocephalic and atraumatic.  Eyes:     Extraocular Movements: Extraocular movements intact.     Pupils: Pupils are equal, round, and reactive to light.  Cardiovascular:     Rate and Rhythm: Normal rate.  Pulmonary:     Effort: Pulmonary effort is normal.     Breath sounds: Normal breath sounds.  Abdominal:     Palpations: Abdomen is soft.  Musculoskeletal:     Cervical back: Normal range of motion and neck supple.     Left knee: Effusion, bony tenderness and crepitus present. Decreased range of motion. Tenderness present over the medial joint line, lateral joint line and patellar tendon. Abnormal alignment and abnormal meniscus.  Neurological:     Mental Status: He is alert and oriented to person, place, and time.  Psychiatric:        Behavior: Behavior normal.     Vital signs in last 24 hours:    Labs:   Estimated body mass index is 38.26 kg/m as calculated from the following:   Height as of 10/25/21: '6\' 1"'$  (1.854 m).   Weight as of 10/25/21: 131.5 kg.   Imaging Review Plain radiographs demonstrate severe degenerative joint disease of the left knee(s). The overall alignment issignificant varus. The bone quality appears to be good for age and reported activity level.      Assessment/Plan:  End stage arthritis, left knee   The patient history, physical examination, clinical judgment of the provider and imaging studies are consistent with end stage degenerative joint disease of the left knee(s) and total knee arthroplasty is deemed medically necessary. The treatment options including medical management, injection therapy arthroscopy and arthroplasty were  discussed at length. The risks and benefits of total knee arthroplasty were presented and reviewed. The risks due to aseptic loosening, infection, stiffness, patella tracking problems, thromboembolic complications and other imponderables were discussed. The patient acknowledged the explanation, agreed to proceed with the plan and consent was signed. Patient is being admitted for inpatient treatment for surgery, pain control, PT, OT, prophylactic antibiotics, VTE prophylaxis, progressive ambulation and ADL's and discharge planning. The patient is planning to be discharged home with home health services

## 2021-11-03 ENCOUNTER — Ambulatory Visit (HOSPITAL_COMMUNITY): Payer: BC Managed Care – PPO | Admitting: Certified Registered Nurse Anesthetist

## 2021-11-03 ENCOUNTER — Inpatient Hospital Stay (HOSPITAL_COMMUNITY)
Admission: RE | Admit: 2021-11-03 | Discharge: 2021-11-05 | DRG: 470 | Disposition: A | Discharge status: Home / Self Care | Payer: BC Managed Care – PPO | Source: Ambulatory Visit | Attending: Orthopaedic Surgery | Admitting: Orthopaedic Surgery

## 2021-11-03 ENCOUNTER — Other Ambulatory Visit: Payer: Self-pay

## 2021-11-03 ENCOUNTER — Encounter (HOSPITAL_COMMUNITY)
Admission: RE | Disposition: A | Discharge status: Home / Self Care | Payer: Self-pay | Source: Ambulatory Visit | Attending: Orthopaedic Surgery

## 2021-11-03 ENCOUNTER — Observation Stay (HOSPITAL_COMMUNITY): Payer: BC Managed Care – PPO

## 2021-11-03 ENCOUNTER — Encounter (HOSPITAL_COMMUNITY): Payer: Self-pay | Admitting: Orthopaedic Surgery

## 2021-11-03 DIAGNOSIS — J45909 Unspecified asthma, uncomplicated: Secondary | ICD-10-CM | POA: Insufficient documentation

## 2021-11-03 DIAGNOSIS — M1712 Unilateral primary osteoarthritis, left knee: Principal | ICD-10-CM | POA: Diagnosis present

## 2021-11-03 DIAGNOSIS — G8918 Other acute postprocedural pain: Secondary | ICD-10-CM | POA: Diagnosis not present

## 2021-11-03 DIAGNOSIS — Z6838 Body mass index (BMI) 38.0-38.9, adult: Secondary | ICD-10-CM | POA: Diagnosis not present

## 2021-11-03 DIAGNOSIS — Z823 Family history of stroke: Secondary | ICD-10-CM | POA: Diagnosis not present

## 2021-11-03 DIAGNOSIS — Z01818 Encounter for other preprocedural examination: Secondary | ICD-10-CM

## 2021-11-03 DIAGNOSIS — Z79899 Other long term (current) drug therapy: Secondary | ICD-10-CM

## 2021-11-03 DIAGNOSIS — I10 Essential (primary) hypertension: Secondary | ICD-10-CM | POA: Diagnosis not present

## 2021-11-03 DIAGNOSIS — Z8249 Family history of ischemic heart disease and other diseases of the circulatory system: Secondary | ICD-10-CM | POA: Diagnosis not present

## 2021-11-03 DIAGNOSIS — Z880 Allergy status to penicillin: Secondary | ICD-10-CM | POA: Diagnosis not present

## 2021-11-03 DIAGNOSIS — E669 Obesity, unspecified: Secondary | ICD-10-CM | POA: Diagnosis present

## 2021-11-03 DIAGNOSIS — Z96652 Presence of left artificial knee joint: Secondary | ICD-10-CM | POA: Diagnosis not present

## 2021-11-03 DIAGNOSIS — M21162 Varus deformity, not elsewhere classified, left knee: Secondary | ICD-10-CM | POA: Diagnosis not present

## 2021-11-03 DIAGNOSIS — Z87891 Personal history of nicotine dependence: Secondary | ICD-10-CM | POA: Diagnosis not present

## 2021-11-03 DIAGNOSIS — Z471 Aftercare following joint replacement surgery: Secondary | ICD-10-CM | POA: Diagnosis not present

## 2021-11-03 HISTORY — PX: TOTAL KNEE ARTHROPLASTY: SHX125

## 2021-11-03 LAB — TYPE AND SCREEN
ABO/RH(D): B POS
Antibody Screen: NEGATIVE

## 2021-11-03 LAB — ABO/RH: ABO/RH(D): B POS

## 2021-11-03 SURGERY — ARTHROPLASTY, KNEE, TOTAL
Anesthesia: General | Site: Knee | Laterality: Left

## 2021-11-03 MED ORDER — LIDOCAINE HCL (PF) 2 % IJ SOLN
INTRAMUSCULAR | Status: AC
  Filled 2021-11-03: qty 5

## 2021-11-03 MED ORDER — HYDROMORPHONE HCL 2 MG PO TABS
ORAL_TABLET | ORAL | Status: AC
  Filled 2021-11-03: qty 1

## 2021-11-03 MED ORDER — DEXAMETHASONE SODIUM PHOSPHATE 10 MG/ML IJ SOLN
INTRAMUSCULAR | Status: AC
  Filled 2021-11-03: qty 1

## 2021-11-03 MED ORDER — MENTHOL 3 MG MT LOZG: 1.0000 | LOZENGE | OROMUCOSAL | Status: DC | PRN

## 2021-11-03 MED ORDER — ONDANSETRON HCL 4 MG/2ML IJ SOLN
INTRAMUSCULAR | Status: AC
  Filled 2021-11-03: qty 2

## 2021-11-03 MED ORDER — HYDROMORPHONE HCL 1 MG/ML IJ SOLN
0.5000 mg | INTRAMUSCULAR | Status: DC | PRN
  Administered 2021-11-04: 1 mg via INTRAVENOUS
  Filled 2021-11-03: qty 1

## 2021-11-03 MED ORDER — HYDROCHLOROTHIAZIDE 25 MG PO TABS
25.0000 mg | ORAL_TABLET | Freq: Every day | ORAL | Status: DC
  Administered 2021-11-04 – 2021-11-05 (×2): 25 mg via ORAL
  Filled 2021-11-03 (×2): qty 1

## 2021-11-03 MED ORDER — METHOCARBAMOL 500 MG IVPB - SIMPLE MED
INTRAVENOUS | Status: AC
  Filled 2021-11-03: qty 55

## 2021-11-03 MED ORDER — OXYCODONE HCL 5 MG PO TABS: 5.0000 mg | ORAL_TABLET | Freq: Once | ORAL | Status: AC | PRN

## 2021-11-03 MED ORDER — METOCLOPRAMIDE HCL 5 MG PO TABS: 5.0000 mg | ORAL_TABLET | Freq: Three times a day (TID) | ORAL | Status: DC | PRN

## 2021-11-03 MED ORDER — PHENYLEPHRINE 80 MCG/ML (10ML) SYRINGE FOR IV PUSH (FOR BLOOD PRESSURE SUPPORT)
PREFILLED_SYRINGE | INTRAVENOUS | Status: DC | PRN
  Administered 2021-11-03 (×3): 80 ug via INTRAVENOUS

## 2021-11-03 MED ORDER — ROCURONIUM BROMIDE 10 MG/ML (PF) SYRINGE
PREFILLED_SYRINGE | INTRAVENOUS | Status: AC
  Filled 2021-11-03: qty 10

## 2021-11-03 MED ORDER — TRANEXAMIC ACID-NACL 1000-0.7 MG/100ML-% IV SOLN
1000.0000 mg | INTRAVENOUS | Status: AC
  Administered 2021-11-03: 1000 mg via INTRAVENOUS
  Filled 2021-11-03: qty 100

## 2021-11-03 MED ORDER — ONDANSETRON HCL 4 MG PO TABS: 4.0000 mg | ORAL_TABLET | Freq: Four times a day (QID) | ORAL | Status: DC | PRN

## 2021-11-03 MED ORDER — HYDROMORPHONE HCL 2 MG PO TABS
2.0000 mg | ORAL_TABLET | ORAL | Status: DC | PRN
  Administered 2021-11-03 – 2021-11-04 (×2): 2 mg via ORAL
  Filled 2021-11-03: qty 1

## 2021-11-03 MED ORDER — METHOCARBAMOL 500 MG IVPB - SIMPLE MED
500.0000 mg | Freq: Four times a day (QID) | INTRAVENOUS | Status: DC | PRN
  Administered 2021-11-03: 500 mg via INTRAVENOUS

## 2021-11-03 MED ORDER — FENTANYL CITRATE PF 50 MCG/ML IJ SOSY
50.0000 ug | PREFILLED_SYRINGE | INTRAMUSCULAR | Status: DC
  Administered 2021-11-03: 50 ug via INTRAVENOUS
  Filled 2021-11-03: qty 2

## 2021-11-03 MED ORDER — LIDOCAINE HCL (CARDIAC) PF 100 MG/5ML IV SOSY
PREFILLED_SYRINGE | INTRAVENOUS | Status: DC | PRN
  Administered 2021-11-03: 60 mg via INTRAVENOUS

## 2021-11-03 MED ORDER — DOCUSATE SODIUM 100 MG PO CAPS
100.0000 mg | ORAL_CAPSULE | Freq: Two times a day (BID) | ORAL | Status: DC
  Administered 2021-11-03 – 2021-11-05 (×4): 100 mg via ORAL
  Filled 2021-11-03 (×4): qty 1

## 2021-11-03 MED ORDER — POVIDONE-IODINE 10 % EX SWAB: 2.0000 "application " | Freq: Once | CUTANEOUS | Status: DC

## 2021-11-03 MED ORDER — ACETAMINOPHEN 325 MG PO TABS
325.0000 mg | ORAL_TABLET | Freq: Four times a day (QID) | ORAL | Status: DC | PRN
  Administered 2021-11-04: 650 mg via ORAL
  Filled 2021-11-03: qty 2

## 2021-11-03 MED ORDER — ROPIVACAINE HCL 5 MG/ML IJ SOLN
INTRAMUSCULAR | Status: DC | PRN
  Administered 2021-11-03: 30 mL via PERINEURAL

## 2021-11-03 MED ORDER — DEXAMETHASONE SODIUM PHOSPHATE 10 MG/ML IJ SOLN
INTRAMUSCULAR | Status: DC | PRN
  Administered 2021-11-03: 10 mg via INTRAVENOUS

## 2021-11-03 MED ORDER — ALUM & MAG HYDROXIDE-SIMETH 200-200-20 MG/5ML PO SUSP: 30.0000 mL | ORAL | Status: DC | PRN

## 2021-11-03 MED ORDER — VANCOMYCIN HCL IN DEXTROSE 1-5 GM/200ML-% IV SOLN
1000.0000 mg | Freq: Two times a day (BID) | INTRAVENOUS | Status: AC
  Administered 2021-11-03: 1000 mg via INTRAVENOUS
  Filled 2021-11-03: qty 200

## 2021-11-03 MED ORDER — DIPHENHYDRAMINE HCL 12.5 MG/5ML PO ELIX: 12.5000 mg | ORAL_SOLUTION | ORAL | Status: DC | PRN

## 2021-11-03 MED ORDER — PANTOPRAZOLE SODIUM 40 MG PO TBEC
40.0000 mg | DELAYED_RELEASE_TABLET | Freq: Every day | ORAL | Status: DC
  Administered 2021-11-04 – 2021-11-05 (×2): 40 mg via ORAL
  Filled 2021-11-03 (×2): qty 1

## 2021-11-03 MED ORDER — ROCURONIUM BROMIDE 10 MG/ML (PF) SYRINGE
PREFILLED_SYRINGE | INTRAVENOUS | Status: DC | PRN
  Administered 2021-11-03: 100 mg via INTRAVENOUS
  Administered 2021-11-03: 20 mg via INTRAVENOUS

## 2021-11-03 MED ORDER — OXYCODONE HCL 5 MG PO TABS
ORAL_TABLET | ORAL | Status: AC
  Administered 2021-11-03: 5 mg via ORAL
  Filled 2021-11-03: qty 1

## 2021-11-03 MED ORDER — LOSARTAN POTASSIUM-HCTZ 100-25 MG PO TABS: 1.0000 | ORAL_TABLET | Freq: Every day | ORAL | Status: DC

## 2021-11-03 MED ORDER — HYDROMORPHONE HCL 1 MG/ML IJ SOLN
0.2500 mg | INTRAMUSCULAR | Status: DC | PRN
  Administered 2021-11-03 (×3): 0.5 mg via INTRAVENOUS

## 2021-11-03 MED ORDER — FENTANYL CITRATE (PF) 250 MCG/5ML IJ SOLN
INTRAMUSCULAR | Status: DC | PRN
  Administered 2021-11-03: 50 ug via INTRAVENOUS
  Administered 2021-11-03: 100 ug via INTRAVENOUS
  Administered 2021-11-03 (×2): 50 ug via INTRAVENOUS

## 2021-11-03 MED ORDER — ONDANSETRON HCL 4 MG/2ML IJ SOLN: 4.0000 mg | Freq: Four times a day (QID) | INTRAMUSCULAR | Status: DC | PRN

## 2021-11-03 MED ORDER — ORAL CARE MOUTH RINSE: 15.0000 mL | Freq: Once | OROMUCOSAL | Status: AC

## 2021-11-03 MED ORDER — OXYCODONE HCL 5 MG/5ML PO SOLN: 5.0000 mg | Freq: Once | ORAL | Status: AC | PRN

## 2021-11-03 MED ORDER — METOPROLOL SUCCINATE ER 50 MG PO TB24
100.0000 mg | ORAL_TABLET | Freq: Every day | ORAL | Status: DC
  Administered 2021-11-04 – 2021-11-05 (×2): 100 mg via ORAL
  Filled 2021-11-03 (×2): qty 2

## 2021-11-03 MED ORDER — MIDAZOLAM HCL 2 MG/2ML IJ SOLN
1.0000 mg | INTRAMUSCULAR | Status: DC
  Administered 2021-11-03: 2 mg via INTRAVENOUS
  Filled 2021-11-03: qty 2

## 2021-11-03 MED ORDER — FENTANYL CITRATE (PF) 250 MCG/5ML IJ SOLN
INTRAMUSCULAR | Status: AC
  Filled 2021-11-03: qty 5

## 2021-11-03 MED ORDER — LOSARTAN POTASSIUM 50 MG PO TABS
100.0000 mg | ORAL_TABLET | Freq: Every day | ORAL | Status: DC
  Administered 2021-11-04 – 2021-11-05 (×2): 100 mg via ORAL
  Filled 2021-11-03 (×2): qty 2

## 2021-11-03 MED ORDER — PHENYLEPHRINE 80 MCG/ML (10ML) SYRINGE FOR IV PUSH (FOR BLOOD PRESSURE SUPPORT)
PREFILLED_SYRINGE | INTRAVENOUS | Status: AC
  Filled 2021-11-03: qty 10

## 2021-11-03 MED ORDER — METHOCARBAMOL 500 MG PO TABS
500.0000 mg | ORAL_TABLET | Freq: Four times a day (QID) | ORAL | Status: DC | PRN
  Administered 2021-11-03 – 2021-11-04 (×4): 500 mg via ORAL
  Filled 2021-11-03 (×4): qty 1

## 2021-11-03 MED ORDER — ASPIRIN 81 MG PO CHEW
81.0000 mg | CHEWABLE_TABLET | Freq: Two times a day (BID) | ORAL | Status: DC
Start: 2021-11-03 — End: 2021-11-05
  Administered 2021-11-03 – 2021-11-05 (×4): 81 mg via ORAL
  Filled 2021-11-03 (×4): qty 1

## 2021-11-03 MED ORDER — PROPOFOL 10 MG/ML IV BOLUS
INTRAVENOUS | Status: AC
  Filled 2021-11-03: qty 20

## 2021-11-03 MED ORDER — PHENOL 1.4 % MT LIQD: 1.0000 | OROMUCOSAL | Status: DC | PRN

## 2021-11-03 MED ORDER — AMLODIPINE BESYLATE 10 MG PO TABS
10.0000 mg | ORAL_TABLET | Freq: Every day | ORAL | Status: DC
Start: 2021-11-03 — End: 2021-11-05
  Administered 2021-11-03 – 2021-11-04 (×2): 10 mg via ORAL
  Filled 2021-11-03 (×2): qty 1

## 2021-11-03 MED ORDER — VANCOMYCIN HCL 1500 MG/300ML IV SOLN
1500.0000 mg | INTRAVENOUS | Status: AC
  Administered 2021-11-03: 1500 mg via INTRAVENOUS
  Filled 2021-11-03: qty 300

## 2021-11-03 MED ORDER — LABETALOL HCL 5 MG/ML IV SOLN
INTRAVENOUS | Status: DC | PRN
  Administered 2021-11-03: 10 mg via INTRAVENOUS

## 2021-11-03 MED ORDER — ONDANSETRON HCL 4 MG/2ML IJ SOLN
INTRAMUSCULAR | Status: DC | PRN
  Administered 2021-11-03: 4 mg via INTRAVENOUS

## 2021-11-03 MED ORDER — ONDANSETRON HCL 4 MG/2ML IJ SOLN: 4.0000 mg | Freq: Once | INTRAMUSCULAR | Status: DC | PRN

## 2021-11-03 MED ORDER — OXYCODONE HCL 5 MG PO TABS
5.0000 mg | ORAL_TABLET | ORAL | Status: DC | PRN
  Administered 2021-11-03 – 2021-11-05 (×9): 10 mg via ORAL
  Filled 2021-11-03 (×9): qty 2

## 2021-11-03 MED ORDER — LABETALOL HCL 5 MG/ML IV SOLN
INTRAVENOUS | Status: AC
  Filled 2021-11-03: qty 4

## 2021-11-03 MED ORDER — POVIDONE-IODINE 10 % EX SWAB: Freq: Once | CUTANEOUS | Status: AC

## 2021-11-03 MED ORDER — DOXAZOSIN MESYLATE 4 MG PO TABS
4.0000 mg | ORAL_TABLET | Freq: Every day | ORAL | Status: DC
  Administered 2021-11-03 – 2021-11-04 (×2): 4 mg via ORAL
  Filled 2021-11-03 (×3): qty 1

## 2021-11-03 MED ORDER — CHLORHEXIDINE GLUCONATE 0.12 % MT SOLN
15.0000 mL | Freq: Once | OROMUCOSAL | Status: AC
Start: 2021-11-03 — End: 2021-11-03
  Administered 2021-11-03: 15 mL via OROMUCOSAL

## 2021-11-03 MED ORDER — PROPOFOL 1000 MG/100ML IV EMUL
INTRAVENOUS | Status: AC
  Filled 2021-11-03: qty 100

## 2021-11-03 MED ORDER — HYDROMORPHONE HCL 1 MG/ML IJ SOLN
INTRAMUSCULAR | Status: AC
  Filled 2021-11-03: qty 2

## 2021-11-03 MED ORDER — SODIUM CHLORIDE 0.9 % IV SOLN: INTRAVENOUS | Status: DC

## 2021-11-03 MED ORDER — METOCLOPRAMIDE HCL 5 MG/ML IJ SOLN: 5.0000 mg | Freq: Three times a day (TID) | INTRAMUSCULAR | Status: DC | PRN

## 2021-11-03 MED ORDER — 0.9 % SODIUM CHLORIDE (POUR BTL) OPTIME
TOPICAL | Status: DC | PRN
  Administered 2021-11-03: 1000 mL

## 2021-11-03 MED ORDER — SODIUM CHLORIDE 0.9 % IR SOLN
Status: DC | PRN
  Administered 2021-11-03: 1000 mL

## 2021-11-03 MED ORDER — PROPOFOL 10 MG/ML IV BOLUS
INTRAVENOUS | Status: DC | PRN
  Administered 2021-11-03: 230 mg via INTRAVENOUS
  Administered 2021-11-03: 10 mg via INTRAVENOUS
  Administered 2021-11-03 (×2): 20 mg via INTRAVENOUS

## 2021-11-03 MED ORDER — SUGAMMADEX SODIUM 200 MG/2ML IV SOLN
INTRAVENOUS | Status: DC | PRN
  Administered 2021-11-03: 400 mg via INTRAVENOUS

## 2021-11-03 MED ORDER — LACTATED RINGERS IV SOLN: INTRAVENOUS | Status: DC

## 2021-11-03 SURGICAL SUPPLY — 64 items
APL SKNCLS STERI-STRIP NONHPOA (GAUZE/BANDAGES/DRESSINGS)
BAG COUNTER SPONGE SURGICOUNT (BAG) IMPLANT
BAG SPEC THK2 15X12 ZIP CLS (MISCELLANEOUS) ×1
BAG SPNG CNTER NS LX DISP (BAG)
BAG ZIPLOCK 12X15 (MISCELLANEOUS) ×3 IMPLANT
BENZOIN TINCTURE PRP APPL 2/3 (GAUZE/BANDAGES/DRESSINGS) IMPLANT
BLADE SAG 18X100X1.27 (BLADE) ×3 IMPLANT
BLADE SAW SGTL 13X75X1.27 (BLADE) ×1 IMPLANT
BLADE SURG SZ10 CARB STEEL (BLADE) ×6 IMPLANT
BNDG ELASTIC 6X5.8 VLCR STR LF (GAUZE/BANDAGES/DRESSINGS) ×5 IMPLANT
BOWL SMART MIX CTS (DISPOSABLE) ×1 IMPLANT
BSPLAT TIB 5D G CMNT STM LT (Knees) ×1 IMPLANT
CEMENT BONE R 1X40 (Cement) ×2 IMPLANT
COMP FEM CMT STD PS SZ12 LT (Joint) ×2 IMPLANT
COMPONENT FEM CMT STD  SZ12LT (Joint) IMPLANT
COOLER ICEMAN CLASSIC (MISCELLANEOUS) ×3 IMPLANT
COVER SURGICAL LIGHT HANDLE (MISCELLANEOUS) ×3 IMPLANT
CUFF TOURN SGL QUICK 34 (TOURNIQUET CUFF) ×2
CUFF TRNQT CYL 34X4.125X (TOURNIQUET CUFF) ×2 IMPLANT
DRAPE INCISE IOBAN 66X45 STRL (DRAPES) ×3 IMPLANT
DRAPE U-SHAPE 47X51 STRL (DRAPES) ×3 IMPLANT
DRSG PAD ABDOMINAL 8X10 ST (GAUZE/BANDAGES/DRESSINGS) ×6 IMPLANT
DURAPREP 26ML APPLICATOR (WOUND CARE) ×3 IMPLANT
ELECT BLADE TIP CTD 4 INCH (ELECTRODE) ×3 IMPLANT
ELECT REM PT RETURN 15FT ADLT (MISCELLANEOUS) ×3 IMPLANT
GAUZE PAD ABD 8X10 STRL (GAUZE/BANDAGES/DRESSINGS) ×1 IMPLANT
GAUZE SPONGE 4X4 12PLY STRL (GAUZE/BANDAGES/DRESSINGS) ×3 IMPLANT
GAUZE XEROFORM 1X8 LF (GAUZE/BANDAGES/DRESSINGS) ×1 IMPLANT
GLOVE BIO SURGEON STRL SZ7.5 (GLOVE) ×3 IMPLANT
GLOVE BIOGEL PI IND STRL 8 (GLOVE) ×4 IMPLANT
GLOVE BIOGEL PI INDICATOR 8 (GLOVE) ×2
GLOVE ECLIPSE 8.0 STRL XLNG CF (GLOVE) ×3 IMPLANT
GOWN STRL REUS W/ TWL XL LVL3 (GOWN DISPOSABLE) ×4 IMPLANT
GOWN STRL REUS W/TWL XL LVL3 (GOWN DISPOSABLE) ×4
HANDPIECE INTERPULSE COAX TIP (DISPOSABLE) ×2
HDLS TROCR DRIL PIN KNEE 75 (PIN) ×2
HOLDER FOLEY CATH W/STRAP (MISCELLANEOUS) IMPLANT
IMMOBILIZER KNEE 20 (SOFTGOODS) ×2
IMMOBILIZER KNEE 20 THIGH 36 (SOFTGOODS) ×2 IMPLANT
KIT TURNOVER KIT A (KITS) IMPLANT
LINER TIB ASF PS GH/12 LT (Liner) ×1 IMPLANT
NS IRRIG 1000ML POUR BTL (IV SOLUTION) ×3 IMPLANT
PACK TOTAL KNEE CUSTOM (KITS) ×3 IMPLANT
PAD COLD SHLDR WRAP-ON (PAD) ×3 IMPLANT
PADDING CAST COTTON 6X4 STRL (CAST SUPPLIES) ×5 IMPLANT
PIN DRILL HDLS TROCAR 75 4PK (PIN) IMPLANT
PROTECTOR NERVE ULNAR (MISCELLANEOUS) ×3 IMPLANT
SCREW FEMALE HEX FIX 25X2.5 (ORTHOPEDIC DISPOSABLE SUPPLIES) ×1 IMPLANT
SET HNDPC FAN SPRY TIP SCT (DISPOSABLE) ×2 IMPLANT
SET PAD KNEE POSITIONER (MISCELLANEOUS) ×3 IMPLANT
SPIKE FLUID TRANSFER (MISCELLANEOUS) IMPLANT
STAPLER VISISTAT 35W (STAPLE) ×1 IMPLANT
STEM POLY PAT PLY 41M KNEE (Knees) ×1 IMPLANT
STEM TIBIA 5 DEG SZ G L KNEE (Knees) IMPLANT
STRIP CLOSURE SKIN 1/2X4 (GAUZE/BANDAGES/DRESSINGS) IMPLANT
SUT MNCRL AB 4-0 PS2 18 (SUTURE) IMPLANT
SUT VIC AB 0 CT1 27 (SUTURE) ×2
SUT VIC AB 0 CT1 27XBRD ANTBC (SUTURE) ×2 IMPLANT
SUT VIC AB 1 CT1 36 (SUTURE) ×6 IMPLANT
SUT VIC AB 2-0 CT1 27 (SUTURE) ×4
SUT VIC AB 2-0 CT1 TAPERPNT 27 (SUTURE) ×4 IMPLANT
TIBIA STEM 5 DEG SZ G L KNEE (Knees) ×2 IMPLANT
TRAY FOLEY MTR SLVR 16FR STAT (SET/KITS/TRAYS/PACK) IMPLANT
WATER STERILE IRR 1000ML POUR (IV SOLUTION) ×6 IMPLANT

## 2021-11-03 NOTE — Op Note (Signed)
NAME: Zachary [name], Zachary [name] MEDICAL RECORD NO: [id number] ACCOUNT NO: [id number] DATE OF BIRTH: Dec 17, 1955 FACILITY: [facility]ATION: [id number] PHYSICIAN: Lind Guest. Ninfa Linden, MD  Operative Report   DATE OF PROCEDURE: 11/03/2021  PREOPERATIVE DIAGNOSIS:  Severe end-stage arthritis and degenerative joint disease with significant varus malalignment, left knee, status post remote ligamentous reconstruction.  POSTOPERATIVE DIAGNOSIS:  Severe end-stage arthritis and degenerative joint disease with significant varus malalignment, left knee, status post remote ligamentous reconstruction.  PROCEDURE:  Left total knee arthroplasty.  IMPLANTS:  Biomet/Zimmer cemented Persona knee system with size 12 left standard CR femur, size G left tibial tray, 12 mm thickness left medial congruent fixed bearing polyethylene insert, size 41 patellar button.  SURGEON:  Lind Guest. Ninfa Linden, MD  ASSISTANT:  Benita Stabile, PA-C.  ANESTHESIA:   1.  Left lower extremity adductor canal block. 2.  Attempted spinal. 3.  General.  TOURNIQUET TIME:  Less than an hour and a half.  ANTIBIOTICS:  1 g of IV vancomycin.  ESTIMATED BLOOD LOSS:  Less than 100 mL.  COMPLICATIONS:  None.  INDICATIONS:  The patient is a 66 year old gentleman with severe end-stage arthritis involving his left knee.  He has a remote history of injury to that knee playing football as a younger person.  He had significant ligamentous reconstruction of the  knee.  At this point, his knee is in varus malalignment, he has complete loss of the joint space.  We have recommended total knee arthroplasty given his daily pain and the detrimental affect he is having on his quality of life, his mobility and his  activities of daily living.  We talked about the risk of acute blood loss anemia, nerve or vessel injury, fracture, infection, DVT, implant failure and skin and soft tissue issues.  We talked about our goals being decreased pain, improved mobility and   overall improved quality of life.  DESCRIPTION OF PROCEDURE:  After informed consent was obtained, appropriate left knee was marked and anesthesia obtained an adductor canal block of his left lower extremity in the holding room and he was then brought to the operating room and sat up on  the operating table where spinal anesthesia was attempted.  They were unable to get spinal anesthesia in place.  So he was laid in supine position and general anesthesia was provided. A nonsterile tourniquet was then placed around his upper left thigh  and his left thigh, knee, leg and ankle were prepped and draped with DuraPrep and sterile drapes including a sterile stockinette.  Of note, he has significant varus malalignment and flexion contracture.  After he was prepped and draped a timeout was  called. He was identified correct patient, correct left knee.  We then used Esmarch to wrap that leg and tourniquet was inflated to 300 mm of pressure.  We then made a direct midline incision over the patella and carried this proximally and distally, we  dissected down the knee joint, carried out a medial parapatellar arthrotomy, finding very large joint effusion and significant arthritis in all 3 compartments of his knee with significant osteophytes.  We removed osteophytes in all three compartments as  well as remnants what was left of any ACL, medial and lateral meniscus.  We then set our extramedullary cutting block for making our proximal tibia cut, taking 2 mm off the low side.  Once we made this cut we also cut for a 3-degree slope.  We then took  it down 2 more millimeters.  We used intramedullary cutting guide  for the femur for a distal femoral cut for a left knee at 5 degrees externally rotated for 10 mm distal femoral cut.  We backed this down 2 mm.  We then brought the knee back down to full  extension and achieved full extension with a 10 mm extension block.  We then went back to the femur and put our femoral  sizing guide, based off of the epicondylar axis.  We chose a size 12 femur, which is the largest available.  We put a 4-in-1 cutting block for a size 12  femur, made our anterior and posterior cuts, followed by our chamfer cuts.  We then went back to the tibia and chose a size G tibial tray for left knee for coverage over the tibial plateau setting, the rotation off the tibial tubercle and the femur.  We  did our drill hole and keel punch off of this.  With a size G trial left tibia, we trialed a 12 left femur and went up to a 12 mm medial congruent polyethylene insert.  We were pleased with range of motion and stability with this insert.  We then made  our patellar cut and drilled three holes for a size 41 patellar button, put the knee through several cycles of motion with trial instrumentation in place and we were pleased with range of motion and stability.  We then removed all trial instrumentation  from the knee and irrigated the knee with normal saline solution using pulsatile lavage.  We mixed our cement and then with the knee in a flexed position, cemented our size G left tibial tray for the Biomet/Zimmer Persona knee.  We cemented our size 12  left, standard CR femur.  We removed excess cement debris from the knee and placed our medial congruent, left polyethylene insert.  We then cemented our patellar button, size 41.  We then held the knee fully extended and compressed to allow the cement to  harden.  Once it hardened, we put the knee through several cycles of motion.  We were pleased with range of motion and stability.  We then let the tourniquet down.  Hemostasis was obtained with electrocautery.  We then closed the arthrotomy with  interrupted #1 Vicryl suture followed by 0 Vicryl to close deep tissue and 2-0 Vicryl to close the subcutaneous tissue.  The skin was closed with staples.  A well-padded sterile dressing was applied.  He was awake and extubated and taken to recovery room  in stable  condition with all final counts being correct.  No complications noted.  Of note, Benita Stabile, PA-C did assist in entire case and assistance was crucial for facilitating all aspects of this case.   PUS D: 11/03/2021 1:17:25 pm T: 11/03/2021 6:43:00 pm  JOB: [id number]/ [id number]

## 2021-11-03 NOTE — Plan of Care (Signed)

## 2021-11-03 NOTE — Evaluation (Signed)
Physical Therapy Evaluation Patient Details Name: Zachary [name]. MRN: [id number] DOB: May 26, 1955 Today's Date: 11/03/2021  History of Present Illness  Pt is a 66yo male presenting s/p L-TKA on 7/723. PMH: HLD, HTN.  Clinical Impression  Zachary [name]. is a 66 y.o. male POD 0 s/p L-TKA. Patient reports IND with mobility at baseline. Patient is now limited by functional impairments (see PT problem list below) demonstrated modified independence for bed mobility and min guard for transfers. Patient was able to ambulate 15 feet with RW and min guard level of assist. Patient instructed in exercise to facilitate ROM and circulation to manage edema. Provided incentive spirometer and with Vcs pt able to achieve 2534m. Educated pt and wife regarding typical progression of rehabilitation including OPPT, knee range of motion goals, and use of cryotherapy. Pt asked about working from home on laptop and encouraged pt to take all necessary time to recover before returning to work, possibly returning at part-time status; educated pt on proper positioning of knee and ergonomics while using laptop and pt verbalized understanding. Patient will benefit from continued skilled PT interventions to address impairments and progress towards PLOF. Acute PT will follow to progress mobility and stair training in preparation for safe discharge home.       Recommendations for follow up therapy are one component of a multi-disciplinary discharge planning process, led by the attending physician.  Recommendations may be updated based on patient status, additional functional criteria and insurance authorization.  Follow Up Recommendations Follow physician's recommendations for discharge plan and follow up therapies      Assistance Recommended at Discharge Set up Supervision/Assistance  Patient can return home with the following  A little help with walking and/or transfers;A little help with  bathing/dressing/bathroom;Assistance with cooking/housework;Assist for transportation;Help with stairs or ramp for entrance    Equipment Recommendations Rolling walker (2 wheels) ([name] as a second language teacher(pt is 6'1"))  Recommendations for Other Services       Functional Status Assessment Patient has had a recent decline in their functional status and demonstrates the ability to make significant improvements in function in a reasonable and predictable amount of time.     Precautions / Restrictions Precautions Precautions: Fall Required Braces or Orthoses: Knee Immobilizer - Left Knee Immobilizer - Left: On when out of bed or walking Restrictions Weight Bearing Restrictions: No Other Position/Activity Restrictions: WBAT      Mobility  Bed Mobility Overal bed mobility: Modified Independent             General bed mobility comments: increased time    Transfers Overall transfer level: Needs assistance Equipment used: Rolling walker (2 wheels) Transfers: Sit to/from Stand Sit to Stand: Min guard, From elevated surface           General transfer comment: for safety only from elevated surface, mulitmodal cues for sequencing and powering up through BUE and RLE.    Ambulation/Gait Ambulation/Gait assistance: Min guard Gait Distance (Feet): 15 Feet Assistive device: Rolling walker (2 wheels) Gait Pattern/deviations: Step-to pattern, Decreased stride length Gait velocity: decreased     General Gait Details: Pt ambulated with bari-walker and min guard, no physical assist required or overt LOB noted.  Stairs            Wheelchair Mobility    Modified Rankin (Stroke Patients Only)       Balance Overall balance assessment: Needs assistance Sitting-balance support: Feet supported, No upper extremity supported Sitting balance-Leahy Scale: Good     Standing balance support: Reliant  on assistive device for balance, During functional activity, Bilateral upper extremity  supported Standing balance-Leahy Scale: Poor                               Pertinent Vitals/Pain Pain Assessment Pain Assessment: 0-10 Pain Score: 5  Pain Location: L knee Pain Descriptors / Indicators: Operative site guarding Pain Intervention(s): Limited activity within patient's tolerance, Monitored during session, Repositioned, Ice applied    Home Living Family/patient expects to be discharged to:: Private residence Living Arrangements: Spouse/significant other Available Help at Discharge: Family;Available 24 hours/day Type of Home: House Home Access: Stairs to enter Entrance Stairs-Rails: None Entrance Stairs-Number of Steps: 1   Home Layout: One level Home Equipment: None Additional Comments: Home environment above describes pt's daughter's home where he will be for 2weeks. After 2weeks he plans to return to his home with 16steps to 2nd floor, 8steps to enter house. Plans to sleep on sofa-bed on first floor.    Prior Function Prior Level of Function : Independent/Modified Independent;Driving;Working/employed Nutritional therapist)             Mobility Comments: IND ADLs Comments: IND     Hand Dominance   Dominant Hand: Right    Extremity/Trunk Assessment   Upper Extremity Assessment Upper Extremity Assessment: Overall WFL for tasks assessed    Lower Extremity Assessment Lower Extremity Assessment: RLE deficits/detail;LLE deficits/detail RLE Deficits / Details: MMT ank DF/PF 5/5 RLE Sensation: WNL LLE Deficits / Details: MMT ank DF/PF 5/5, no extensor lag noted LLE Sensation: decreased light touch    Cervical / Trunk Assessment Cervical / Trunk Assessment: Normal  Communication   Communication: No difficulties  Cognition Arousal/Alertness: Awake/alert Behavior During Therapy: WFL for tasks assessed/performed Overall Cognitive Status: Within Functional Limits for tasks assessed                                          General  Comments General comments (skin integrity, edema, etc.): Wife present during sessions    Exercises Total Joint Exercises Ankle Circles/Pumps: AROM, Both, 10 reps, Seated   Assessment/Plan    PT Assessment Patient needs continued PT services  PT Problem List Decreased strength;Decreased range of motion;Decreased activity tolerance;Decreased balance;Decreased mobility;Decreased coordination;Decreased knowledge of use of DME;Pain       PT Treatment Interventions DME instruction;Gait training;Stair training;Functional mobility training;Therapeutic activities;Therapeutic exercise;Balance training;Neuromuscular re-education;Patient/family education    PT Goals (Current goals can be found in the Care Plan section)  Acute Rehab PT Goals Patient Stated Goal: Walk and stand for longer periods PT Goal Formulation: With patient/family Time For Goal Achievement: 11/10/21 Potential to Achieve Goals: Good    Frequency 7X/week     Co-evaluation               AM-PAC PT "6 Clicks" Mobility  Outcome Measure Help needed turning from your back to your side while in a flat bed without using bedrails?: None Help needed moving from lying on your back to sitting on the side of a flat bed without using bedrails?: None Help needed moving to and from a bed to a chair (including a wheelchair)?: A Little Help needed standing up from a chair using your arms (e.g., wheelchair or bedside chair)?: A Little Help needed to walk in hospital room?: A Little Help needed climbing 3-5 steps with a railing? : A Little  6 Click Score: 20    End of Session Equipment Utilized During Treatment: Gait belt;Left knee immobilizer Activity Tolerance: No increased pain;Patient tolerated treatment well Patient left: in chair;with call bell/phone within reach;with chair alarm set;with family/visitor present;with SCD's reapplied Nurse Communication: Mobility status PT Visit Diagnosis: Pain;Difficulty in walking, not  elsewhere classified (R26.2) Pain - Right/Left: Left Pain - part of body: Knee    Time: 6384-5364 PT Time Calculation (min) (ACUTE ONLY): 45 min   Charges:   PT Evaluation $PT Eval Low Complexity: 1 Low PT Treatments $Gait Training: 8-22 mins $Self Care/Home Management: 8-22        [name] Breeze, PT, DPT [facility] Department Office: [phone] Pager: [phone]  [name] Breeze 11/03/2021, 6:19 PM

## 2021-11-03 NOTE — Interval H&P Note (Signed)
History and Physical Interval Note: The patient understands that he is here today for a left total knee replacement to treat his severe left knee osteoarthritis.  There has been no acute or interval change in medical status.  See recent H&P.  The risks and benefits of surgery been explained in detail and informed consent is obtained.  The left operative knee has been marked.  11/03/2021 9:55 AM  Zachary [name].  has presented today for surgery, with the diagnosis of OSTEOARTHRITIS / [place].  The various methods of treatment have been discussed with the patient and family. After consideration of risks, benefits and other options for treatment, the patient has consented to  Procedure(s): LEFT TOTAL KNEE ARTHROPLASTY (Left) as a surgical intervention.  The patient's history has been reviewed, patient examined, no change in status, stable for surgery.  I have reviewed the patient's chart and labs.  Questions were answered to the patient's satisfaction.     Mcarthur Rossetti

## 2021-11-03 NOTE — Anesthesia Postprocedure Evaluation (Signed)
Anesthesia Post Note  Patient: Zachary [name].  Procedure(s) Performed: LEFT TOTAL KNEE ARTHROPLASTY (Left: Knee)     Patient location during evaluation: PACU Anesthesia Type: General Level of consciousness: awake and alert and oriented Pain management: pain level controlled Vital Signs Assessment: post-procedure vital signs reviewed and stable Respiratory status: spontaneous breathing, nonlabored ventilation and respiratory function stable Cardiovascular status: blood pressure returned to baseline and stable Postop Assessment: no apparent nausea or vomiting Anesthetic complications: no   No notable events documented.  Last Vitals:  Vitals:   11/03/21 1445 11/03/21 1500  BP: (!) 144/73 (!) 142/67  Pulse: 71 69  Resp: 15 13  Temp:    SpO2: 97% 98%    Last Pain:  Vitals:   11/03/21 1500  TempSrc:   PainSc: 0-No pain                 Samayah Novinger A.

## 2021-11-03 NOTE — Anesthesia Preprocedure Evaluation (Addendum)
Anesthesia Evaluation  Patient identified by MRN, date of birth, ID band Patient awake    Reviewed: Allergy & Precautions, NPO status , Patient's Chart, lab work & pertinent test results, reviewed documented beta blocker date and time   Airway Mallampati: II  TM Distance: >3 FB Neck ROM: Full    Dental no notable dental hx. (+) Teeth Intact, Dental Advisory Given, Caps   Pulmonary asthma , former smoker,    Pulmonary exam normal breath sounds clear to auscultation       Cardiovascular hypertension, Pt. on medications and Pt. 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, LAFB, LVH   Neuro/Psych negative neurological ROS  negative psych ROS   GI/Hepatic negative GI ROS, Neg liver ROS,   Endo/Other  Obesity Hyperlipidemia  Renal/GU negative Renal ROS  negative genitourinary   Musculoskeletal  (+) Arthritis , Osteoarthritis,  OA left knee    Abdominal (+) + obese,   Peds  Hematology negative hematology ROS (+)   Anesthesia Other Findings   Reproductive/Obstetrics                            Anesthesia Physical Anesthesia Plan  ASA: 2  Anesthesia Plan: Spinal   Post-op Pain Management: Minimal or no pain anticipated, Regional block* and Dilaudid IV   Induction: Intravenous  PONV Risk Score and Plan: 2 and Treatment may vary due to age or medical condition, Midazolam, Ondansetron and Propofol infusion  Airway Management Planned: Natural Airway, Simple Face Mask and Nasal Cannula  Additional Equipment: None  Intra-op Plan:   Post-operative Plan:   Informed Consent: I have reviewed the patients History and Physical, chart, labs and discussed the procedure including the risks, benefits and alternatives for the proposed anesthesia with the patient or authorized representative who has indicated his/her understanding and  acceptance.     Dental advisory given  Plan Discussed with: CRNA and Anesthesiologist  Anesthesia Plan Comments:        Anesthesia Quick Evaluation

## 2021-11-03 NOTE — Anesthesia Procedure Notes (Signed)
Procedure Name: Intubation Date/Time: 11/03/2021 11:36 AM  Performed by: Raenette Rover, CRNAPre-anesthesia Checklist: Patient identified, Emergency Drugs available, Suction available and Patient being monitored Patient Re-evaluated:Patient Re-evaluated prior to induction Oxygen Delivery Method: Circle system utilized Preoxygenation: Pre-oxygenation with 100% oxygen Induction Type: IV induction Ventilation: Mask ventilation without difficulty Laryngoscope Size: Mac and 4 Grade View: Grade I Tube type: Oral Tube size: 7.5 mm Number of attempts: 1 Airway Equipment and Method: Stylet Placement Confirmation: ETT inserted through vocal cords under direct vision, positive ETCO2 and breath sounds checked- equal and bilateral Secured at: 23 cm Tube secured with: Tape Dental Injury: Teeth and Oropharynx as per pre-operative assessment

## 2021-11-03 NOTE — Anesthesia Procedure Notes (Signed)
Anesthesia Regional Block: Adductor canal block   Pre-Anesthetic Checklist: , timeout performed,  Correct Patient, Correct Site, Correct Laterality,  Correct Procedure, Correct Position, site marked,  Risks and benefits discussed,  Surgical consent,  Pre-op evaluation,  At surgeon's request and post-op pain management  Laterality: Left  Prep: chloraprep       Needles:  Injection technique: Single-shot  Needle Type: Echogenic Stimulator Needle     Needle Length: 10cm  Needle Gauge: 21   Needle insertion depth: 8 cm   Additional Needles:   Procedures:,,,, ultrasound used (permanent image in chart),,    Narrative:  Start time: 11/03/2021 11:01 AM End time: 11/03/2021 11:06 AM Injection made incrementally with aspirations every 5 mL.  Performed by: Personally  Anesthesiologist: Josephine Igo, MD  Additional Notes: Timeout performed. Patient sedated. Relevant anatomy ID'd using [place]. Incremental 2-69m injection of LA with frequent aspiration. Patient tolerated procedure well.     Left Adductor Canal Block

## 2021-11-03 NOTE — Transfer of Care (Signed)
Immediate Anesthesia Transfer of Care Note  Patient: Zachary [name].  Procedure(s) Performed: LEFT TOTAL KNEE ARTHROPLASTY (Left: Knee)  Patient Location: PACU  Anesthesia Type:GA combined with regional for post-op pain  Level of Consciousness: awake, alert , oriented, drowsy and patient cooperative  Airway & Oxygen Therapy: Patient Spontanous Breathing and Patient connected to nasal cannula oxygen  Post-op Assessment: Report given to RN and Post -op Vital signs reviewed and stable  Post vital signs: Reviewed and stable  Last Vitals:  Vitals Value Taken Time  BP 132/78 1350  Temp    Pulse 79 11/03/21 1349  Resp 15 11/03/21 1349  SpO2 100 % 11/03/21 1349  Vitals shown include unvalidated device data.  Last Pain:  Vitals:   11/03/21 1105  TempSrc:   PainSc: 0-No pain      Patients Stated Pain Goal: 3 (43/60/67 7034)  Complications: No notable events documented.

## 2021-11-03 NOTE — Brief Op Note (Signed)
11/03/2021  1:19 PM  PATIENT:  Zachary [name].  66 y.o. male  PRE-OPERATIVE DIAGNOSIS:  OSTEOARTHRITIS / DEGENERATIVE JOINT DISEASE LEFT KNEE  POST-OPERATIVE DIAGNOSIS:  OSTEOARTHRITIS / DEGENERATIVE JOINT   PROCEDURE:  Procedure(s): LEFT TOTAL KNEE ARTHROPLASTY (Left)  SURGEON:  Surgeon(s) and Role:    * Mcarthur Rossetti, MD - Primary  PHYSICIAN ASSISTANT:  Benita Stabile, PA-C  ANESTHESIA:   regional and general  EBL:  50 mL   COUNTS:  YES  TOURNIQUET:   Total Tourniquet Time Documented: Thigh (Left) - 74 minutes Total: Thigh (Left) - 74 minutes   DICTATION: .Other Dictation: Dictation Number [id number]  PLAN OF CARE: Admit for overnight observation  PATIENT DISPOSITION:  PACU - hemodynamically stable.   Delay start of Pharmacological VTE agent (>24hrs) due to surgical blood loss or risk of bleeding: no

## 2021-11-04 LAB — CBC
HCT: 38.7 % — ABNORMAL LOW (ref 39.0–52.0)
Hemoglobin: 12.8 g/dL — ABNORMAL LOW (ref 13.0–17.0)
MCH: 28.9 pg (ref 26.0–34.0)
MCHC: 33.1 g/dL (ref 30.0–36.0)
MCV: 87.4 fL (ref 80.0–100.0)
Platelets: 180 10*3/uL (ref 150–400)
RBC: 4.43 MIL/uL (ref 4.22–5.81)
RDW: 13.7 % (ref 11.5–15.5)
WBC: 11 10*3/uL — ABNORMAL HIGH (ref 4.0–10.5)
nRBC: 0 % (ref 0.0–0.2)

## 2021-11-04 LAB — BASIC METABOLIC PANEL
Anion gap: 8 (ref 5–15)
BUN: 13 mg/dL (ref 8–23)
CO2: 25 mmol/L (ref 22–32)
Calcium: 8.5 mg/dL — ABNORMAL LOW (ref 8.9–10.3)
Chloride: 103 mmol/L (ref 98–111)
Creatinine, Ser: 0.73 mg/dL (ref 0.61–1.24)
GFR, Estimated: >60 mL/min (ref >60)
Glucose, Bld: 149 mg/dL — ABNORMAL HIGH (ref 70–99)
Potassium: 3.7 mmol/L (ref 3.5–5.1)
Sodium: 136 mmol/L (ref 135–145)

## 2021-11-04 NOTE — Plan of Care (Signed)
  Problem: Education: Goal: Knowledge of General Education information will improve Description: Including pain rating scale, medication(s)/side effects and non-pharmacologic comfort measures Outcome: Progressing   Problem: Education: Goal: Knowledge of the prescribed therapeutic regimen will improve Outcome: Progressing   Problem: Activity: Goal: Ability to avoid complications of mobility impairment will improve Outcome: Progressing   Problem: Clinical Measurements: Goal: Postoperative complications will be avoided or minimized Outcome: Progressing   Problem: Pain Management: Goal: Pain level will decrease with appropriate interventions Outcome: Progressing

## 2021-11-04 NOTE — Progress Notes (Signed)
Physical Therapy Treatment Patient Details Name: Zachary [name]. MRN: [id number] DOB: [birth year] Today's Date: 11/04/2021   History of Present Illness Pt is a 66yo male presenting s/p L-TKA on 7/723. PMH: HLD, HTN.    PT Comments    Pt is progressing well with mobility, he ambulated 150' with RW, no loss of balance. Pt performed TKA HEP with supervision. Continued good progress expected.    Recommendations for follow up therapy are one component of a multi-disciplinary discharge planning process, led by the attending physician.  Recommendations may be updated based on patient status, additional functional criteria and insurance authorization.  Follow Up Recommendations  Follow physician's recommendations for discharge plan and follow up therapies     Assistance Recommended at Discharge Set up Supervision/Assistance  Patient can return home with the following A little help with walking and/or transfers;A little help with bathing/dressing/bathroom;Assistance with cooking/housework;Assist for transportation;Help with stairs or ramp for entrance   Equipment Recommendations  Rolling walker (2 wheels) [name] as a second language teacher (pt is 6'1"))    Recommendations for Other Services       Precautions / Restrictions Precautions Precautions: Fall;Knee Precaution Booklet Issued: Yes (comment) Precaution Comments: reviewed no pillow under knee Restrictions Weight Bearing Restrictions: No Other Position/Activity Restrictions: WBAT     Mobility  Bed Mobility Overal bed mobility: Needs Assistance Bed Mobility: Sit to Supine       Sit to supine: Min assist   General bed mobility comments: min A for LEs into bed    Transfers Overall transfer level: Needs assistance Equipment used: Rolling walker (2 wheels)   Sit to Stand: Min guard, From elevated surface           General transfer comment: for safety only from elevated surface, mulitmodal cues for sequencing and powering up through BUE  and RLE.    Ambulation/Gait Ambulation/Gait assistance: Min guard Gait Distance (Feet): 150 Feet Assistive device: Rolling walker (2 wheels) Gait Pattern/deviations: Step-to pattern, Decreased stride length Gait velocity: decreased     General Gait Details: Pt ambulated with bari-walker and min guard, no physical assist required or overt LOB noted.   Stairs             Wheelchair Mobility    Modified Rankin (Stroke Patients Only)       Balance Overall balance assessment: Needs assistance Sitting-balance support: Feet supported, No upper extremity supported Sitting balance-Leahy Scale: Good     Standing balance support: Reliant on assistive device for balance, During functional activity, Bilateral upper extremity supported Standing balance-Leahy Scale: Poor                              Cognition Arousal/Alertness: Awake/alert Behavior During Therapy: WFL for tasks assessed/performed Overall Cognitive Status: Within Functional Limits for tasks assessed                                          Exercises Total Joint Exercises Ankle Circles/Pumps: AROM, Both, 10 reps, Seated Quad Sets: AROM, Left, 5 reps, Supine Short Arc Quad: AROM, Left, 10 reps, Supine Heel Slides: AAROM, Left, 10 reps, Supine Hip ABduction/ADduction: AROM, Left, 10 reps, Supine Straight Leg Raises: AROM, Left, 5 reps, Supine Long Arc Quad: AROM, Left, 10 reps, Seated Knee Flexion: AAROM, Left, 10 reps, Seated Goniometric ROM: 5-75* AAROM L knee    General Comments  Pertinent Vitals/Pain Pain Assessment Pain Score: 8  Pain Location: L knee Pain Descriptors / Indicators: Operative site guarding Pain Intervention(s): Limited activity within patient's tolerance, Monitored during session, Premedicated before session, Ice applied    Home Living                          Prior Function            PT Goals (current goals can now be found  in the care plan section) Acute Rehab PT Goals Patient Stated Goal: Walk and stand for longer periods PT Goal Formulation: With patient/family Time For Goal Achievement: 11/10/21 Potential to Achieve Goals: Good Progress towards PT goals: Progressing toward goals    Frequency    7X/week      PT Plan Current plan remains appropriate    Co-evaluation              AM-PAC PT "6 Clicks" Mobility   Outcome Measure  Help needed turning from your back to your side while in a flat bed without using bedrails?: None Help needed moving from lying on your back to sitting on the side of a flat bed without using bedrails?: None Help needed moving to and from a bed to a chair (including a wheelchair)?: A Little Help needed standing up from a chair using your arms (e.g., wheelchair or bedside chair)?: A Little Help needed to walk in hospital room?: A Little Help needed climbing 3-5 steps with a railing? : A Little 6 Click Score: 20    End of Session Equipment Utilized During Treatment: Gait belt Activity Tolerance: Patient tolerated treatment well Patient left: with call bell/phone within reach;in bed;with bed alarm set Nurse Communication: Mobility status PT Visit Diagnosis: Pain;Difficulty in walking, not elsewhere classified (R26.2) Pain - Right/Left: Left Pain - part of body: Knee     Time: 8366-2947 PT Time Calculation (min) (ACUTE ONLY): 28 min  Charges:  $Gait Training: 8-22 mins $Therapeutic Exercise: 8-22 mins                     Blondell Reveal Kistler PT 11/04/2021  Acute Rehabilitation Services  Office [phone]

## 2021-11-04 NOTE — Discharge Instructions (Signed)

## 2021-11-04 NOTE — Progress Notes (Signed)
Subjective: 1 Day Post-Op Procedure(s) (LRB): LEFT TOTAL KNEE ARTHROPLASTY (Left) Patient reports pain as moderate.    Objective: Vital signs in last 24 hours: Temp:  [97.6 F (36.4 C)-98.7 F (37.1 C)] 98.1 F (36.7 C) (07/08 0529) Pulse Rate:  [69-116] 88 (07/08 0529) Resp:  [9-20] 17 (07/08 0529) BP: (132-181)/(67-114) 151/87 (07/08 0529) SpO2:  [95 %-100 %] 100 % (07/08 0529) Weight:  [131.5 kg] 131.5 kg (07/07 0857)  Intake/Output from previous day: 07/07 0701 - 07/08 0700 In: 3788 [P.O.:240; I.V.:3393; IV Piggyback:155] Out: 2575 [Urine:2525; Blood:50] Intake/Output this shift: Total I/O In: -  Out: 400 [Urine:400]  Recent Labs    11/04/21 0406  HGB 12.8*   Recent Labs    11/04/21 0406  WBC 11.0*  RBC 4.43  HCT 38.7*  PLT 180   Recent Labs    11/04/21 0406  NA 136  K 3.7  CL 103  CO2 25  BUN 13  CREATININE 0.73  GLUCOSE 149*  CALCIUM 8.5*   No results for input(s): "LABPT", "INR" in the last 72 hours.  Sensation intact distally Intact pulses distally Dorsiflexion/Plantar flexion intact Incision: scant drainage No cellulitis present Compartment soft   Assessment/Plan: 1 Day Post-Op Procedure(s) (LRB): LEFT TOTAL KNEE ARTHROPLASTY (Left) Up with therapy Plan for discharge tomorrow Discharge home with home health      Mcarthur Rossetti 11/04/2021, 8:30 AM

## 2021-11-04 NOTE — Progress Notes (Addendum)
Physical Therapy Treatment Patient Details Name: Zachary [name]. MRN: [id number] DOB: 07-03-1955 Today's Date: 11/04/2021   History of Present Illness Pt is a 66yo male presenting s/p L-TKA on 7/723. PMH: HLD, HTN.    PT Comments    Pt is progressing well with mobility, he ambulated 115' with RW, no loss of balance, no buckling of L knee. Instructed pt in TKA HEP. He is able to perform a SLR independently. AAROM L knee is ~5-60*. Pt puts forth good effort. Continued good progress is expected.     Recommendations for follow up therapy are one component of a multi-disciplinary discharge planning process, led by the attending physician.  Recommendations may be updated based on patient status, additional functional criteria and insurance authorization.  Follow Up Recommendations  Follow physician's recommendations for discharge plan and follow up therapies     Assistance Recommended at Discharge Set up Supervision/Assistance  Patient can return home with the following A little help with walking and/or transfers;A little help with bathing/dressing/bathroom;Assistance with cooking/housework;Assist for transportation;Help with stairs or ramp for entrance   Equipment Recommendations  Rolling walker (2 wheels) [name] as a second language teacher (pt is 6'1"))    Recommendations for Other Services       Precautions / Restrictions Precautions Precautions: Fall; knee (reviewed no pillow under knee) Restrictions Weight Bearing Restrictions: No Other Position/Activity Restrictions: WBAT     Mobility  Bed Mobility               General bed mobility comments: sitting up at EOB    Transfers Overall transfer level: Needs assistance Equipment used: Rolling walker (2 wheels)   Sit to Stand: Min guard, From elevated surface           General transfer comment: for safety only from elevated surface, mulitmodal cues for sequencing and powering up through BUE and RLE.     Ambulation/Gait Ambulation/Gait assistance: Min guard Gait Distance (Feet): 115 Feet Assistive device: Rolling walker (2 wheels) Gait Pattern/deviations: Step-to pattern, Decreased stride length Gait velocity: decreased     General Gait Details: Pt ambulated with bari-walker and min guard, no physical assist required or overt LOB noted.   Stairs             Wheelchair Mobility    Modified Rankin (Stroke Patients Only)       Balance Overall balance assessment: Needs assistance Sitting-balance support: Feet supported, No upper extremity supported Sitting balance-Leahy Scale: Good     Standing balance support: Reliant on assistive device for balance, During functional activity, Bilateral upper extremity supported Standing balance-Leahy Scale: Poor                              Cognition Arousal/Alertness: Awake/alert Behavior During Therapy: WFL for tasks assessed/performed Overall Cognitive Status: Within Functional Limits for tasks assessed                                          Exercises Total Joint Exercises Ankle Circles/Pumps: AROM, Both, 10 reps, Seated Quad Sets: AROM, Left, 5 reps, Supine Short Arc Quad: AROM, Left, 10 reps, Supine Heel Slides: AAROM, Left, 10 reps, Supine Hip ABduction/ADduction: AROM, Left, 10 reps, Supine Straight Leg Raises: AROM, Left, 5 reps, Supine Knee Flexion: AAROM, Left, 10 reps, Seated Goniometric ROM: 5-60* AAROM L knee    General Comments  Pertinent Vitals/Pain Pain Assessment Pain Score: 8  Pain Location: L knee Pain Descriptors / Indicators: Operative site guarding Pain Intervention(s): Limited activity within patient's tolerance, Ice applied, Monitored during session, Premedicated before session    Home Living                          Prior Function            PT Goals (current goals can now be found in the care plan section) Acute Rehab PT Goals Patient  Stated Goal: Walk and stand for longer periods PT Goal Formulation: With patient/family Time For Goal Achievement: 11/10/21 Potential to Achieve Goals: Good Progress towards PT goals: Progressing toward goals    Frequency    7X/week      PT Plan Current plan remains appropriate    Co-evaluation              AM-PAC PT "6 Clicks" Mobility   Outcome Measure  Help needed turning from your back to your side while in a flat bed without using bedrails?: None Help needed moving from lying on your back to sitting on the side of a flat bed without using bedrails?: None Help needed moving to and from a bed to a chair (including a wheelchair)?: A Little Help needed standing up from a chair using your arms (e.g., wheelchair or bedside chair)?: A Little Help needed to walk in hospital room?: A Little Help needed climbing 3-5 steps with a railing? : A Little 6 Click Score: 20    End of Session Equipment Utilized During Treatment: Gait belt Activity Tolerance: Patient tolerated treatment well Patient left: in chair;with call bell/phone within reach;with chair alarm set Nurse Communication: Mobility status PT Visit Diagnosis: Pain;Difficulty in walking, not elsewhere classified (R26.2) Pain - Right/Left: Left Pain - part of body: Knee     Time: 1583-0940 PT Time Calculation (min) (ACUTE ONLY): 24 min  Charges:  $Gait Training: 8-22 mins $Therapeutic Exercise: 8-22 mins                     Blondell Reveal Kistler PT 11/04/2021  Acute Rehabilitation Services  Office [phone]

## 2021-11-04 NOTE — Plan of Care (Signed)
  Problem: Education: Goal: Knowledge of General Education information will improve Description: Including pain rating scale, medication(s)/side effects and non-pharmacologic comfort measures Outcome: Progressing   Problem: Clinical Measurements: Goal: Respiratory complications will improve Outcome: Not Applicable Goal: Cardiovascular complication will be avoided Outcome: Not Applicable   Problem: Activity: Goal: Risk for activity intolerance will decrease Outcome: Progressing   Problem: Nutrition: Goal: Adequate nutrition will be maintained Outcome: Progressing   Problem: Coping: Goal: Level of anxiety will decrease Outcome: Progressing   Problem: Pain Managment: Goal: General experience of comfort will improve Outcome: Progressing

## 2021-11-05 MED ORDER — TIZANIDINE HCL 4 MG PO TABS: 4.0000 mg | ORAL_TABLET | Freq: Four times a day (QID) | ORAL | 0 refills | Status: DC | PRN

## 2021-11-05 MED ORDER — ASPIRIN 81 MG PO CHEW: 81.0000 mg | CHEWABLE_TABLET | Freq: Two times a day (BID) | ORAL | 0 refills | Status: AC

## 2021-11-05 MED ORDER — OXYCODONE HCL 5 MG PO TABS: 5.0000 mg | ORAL_TABLET | Freq: Four times a day (QID) | ORAL | 0 refills | Status: DC | PRN

## 2021-11-05 NOTE — Progress Notes (Signed)
Discharge instructions reviewed with patient. Verbalizes understanding. 

## 2021-11-05 NOTE — Discharge Summary (Signed)
Patient ID: Zachary [name]. MRN: [id number] DOB/AGE: May 23, 1955 66 y.o.  Admit date: 11/03/2021 Discharge date: 11/05/2021  Admission Diagnoses:  Principal Problem:   Unilateral primary osteoarthritis, left knee Active Problems:   Status post total left knee replacement   Discharge Diagnoses:  Same  Past Medical History:  Diagnosis Date   Allergy    Arthritis    Asthma    Blood transfusion without reported diagnosis    as child   Constipation    Heart murmur    mild   Hyperlipidemia    slight elevation   Hypertension     Surgeries: Procedure(s): LEFT TOTAL KNEE ARTHROPLASTY on 11/03/2021   Consultants:   Discharged Condition: Improved  Hospital Course: Zachary [name]. is an 65 y.o. male who was admitted 11/03/2021 for operative treatment ofUnilateral primary osteoarthritis, left knee. Patient has severe unremitting pain that affects sleep, daily activities, and work/hobbies. After pre-op clearance the patient was taken to the operating room on 11/03/2021 and underwent  Procedure(s): LEFT TOTAL KNEE ARTHROPLASTY.    Patient was given perioperative antibiotics:  Anti-infectives (From admission, onward)    Start     Dose/Rate Route Frequency Ordered Stop   11/03/21 2200  vancomycin (VANCOCIN) IVPB 1000 mg/200 mL premix        1,000 mg 200 mL/hr over 60 Minutes Intravenous Every 12 hours 11/03/21 1551 11/03/21 2200   11/03/21 1000  vancomycin (VANCOREADY) IVPB 1500 mg/300 mL        1,500 mg 150 mL/hr over 120 Minutes Intravenous On call to O.R. 11/03/21 [id number] 11/03/21 1222        Patient was given sequential compression devices, early ambulation, and chemoprophylaxis to prevent DVT.  Patient benefited maximally from hospital stay and there were no complications.    Recent vital signs: Patient Vitals for the past 24 hrs:  BP Temp Temp src Pulse Resp SpO2  11/05/21 0601 120/67 99 F (37.2 C) Oral (!) 105 17 96 %  11/04/21 2141 136/76 98 F (36.7 C) Oral 93 17  98 %  11/04/21 1342 121/64 98.4 F (36.9 C) -- 88 17 100 %  11/04/21 0957 129/79 98 F (36.7 C) -- 90 18 98 %  11/04/21 0842 (!) 145/82 -- -- 89 -- --     Recent laboratory studies:  Recent Labs    11/04/21 0406  WBC 11.0*  HGB 12.8*  HCT 38.7*  PLT 180  NA 136  K 3.7  CL 103  CO2 25  BUN 13  CREATININE 0.73  GLUCOSE 149*  CALCIUM 8.5*     Discharge Medications:   Allergies as of 11/05/2021       Reactions   Penicillins Rash        Medication List     TAKE these medications    acetaminophen 650 MG CR tablet Commonly known as: TYLENOL Take 1,300 mg by mouth every 8 (eight) hours as needed for pain.   amLODipine 10 MG tablet Commonly known as: NORVASC Take 1 tablet by mouth once daily What changed: when to take this   aspirin 81 MG chewable tablet Chew 1 tablet (81 mg total) by mouth 2 (two) times daily.   atorvastatin 10 MG tablet Commonly known as: LIPITOR TAKE 1 TABLET BY MOUTH ONCE DAILY. CAN START FEW DAYS PER WEEK INITIALLY, THEN UP TO DAILY IF TOLERATED   diclofenac Sodium 1 % Gel Commonly known as: VOLTAREN Apply 1 Application topically 3 (three) times daily as needed (pain).  doxazosin 4 MG tablet Commonly known as: CARDURA Take 1 tablet by mouth once daily What changed: when to take this   ibuprofen 200 MG tablet Commonly known as: ADVIL Take 400 mg by mouth every 6 (six) hours as needed for moderate pain.   losartan-hydrochlorothiazide 100-25 MG tablet Commonly known as: HYZAAR Take 1 tablet by mouth once daily   metoprolol succinate 100 MG 24 hr tablet Commonly known as: TOPROL-XL TAKE 1 TABLET BY MOUTH ONCE DAILY. TAKE WITH OR IMMEDIATELY FOLLOWING A MEAL   oxyCODONE 5 MG immediate release tablet Commonly known as: Oxy IR/ROXICODONE Take 1-2 tablets (5-10 mg total) by mouth every 6 (six) hours as needed for moderate pain (pain score 4-6). No more than 5 tablets per day.   tiZANidine 4 MG tablet Commonly known as:  Zanaflex Take 1 tablet (4 mg total) by mouth every 6 (six) hours as needed for muscle spasms.   Vicks Sinex Severe Decongest 0.05 % nasal spray Generic drug: oxymetazoline Place 1 spray into both nostrils 2 (two) times daily as needed for congestion.               Durable Medical Equipment  (From admission, onward)           Start     Ordered   11/03/21 1627  DME 3 n 1  Once        11/03/21 1626   11/03/21 1627  DME Walker rolling  Once       Question Answer Comment  Walker: With 5 Inch Wheels   Patient needs a walker to treat with the following condition Status post total left knee replacement      11/03/21 1626            Diagnostic Studies: DG Knee Left Port  Result Date: 11/03/2021 CLINICAL DATA:  Postop left total knee arthroplasty. EXAM: PORTABLE LEFT KNEE - 1-2 VIEW COMPARISON:  Radiographs 06/14/2021. FINDINGS: Status post interval left total knee arthroplasty. The hardware is well positioned. There is no evidence of acute fracture or dislocation. Anterior skin staples are present. There is gas in the joint and the anterior soft tissues. IMPRESSION: No demonstrated complication following interval left total knee arthroplasty. Electronically Signed   By: Richardean Sale M.D.   On: 11/03/2021 14:33    Disposition: Discharge disposition: 01-Home or [place]     Zachary Rossetti, MD Follow up in 2 week(s).   Specialty: Orthopedic Surgery Contact information: [address] [place] [address] [phone]                  Signed: [name] 11/05/2021, 8:29 AM

## 2021-11-05 NOTE — Progress Notes (Signed)
Physical Therapy Treatment Patient Details Name: Zachary [name]. MRN: [id number] DOB: [birth year] Today's Date: 11/05/2021   History of Present Illness Pt is a 66yo male presenting s/p L-TKA on 7/723. PMH: HLD, HTN.    PT Comments    Pt doing well today, making excellent progress. Demonstrates good gait stability. Reviewed knee precautions. Will benefit from continued PT post acute. Ready for d/c with family assist as needed.   Recommendations for follow up therapy are one component of a multi-disciplinary discharge planning process, led by the attending physician.  Recommendations may be updated based on patient status, additional functional criteria and insurance authorization.  Follow Up Recommendations  Follow physician's recommendations for discharge plan and follow up therapies     Assistance Recommended at Discharge Set up Supervision/Assistance  Patient can return home with the following A little help with walking and/or transfers;A little help with bathing/dressing/bathroom;Assistance with cooking/housework;Assist for transportation;Help with stairs or ramp for entrance   Equipment Recommendations  Rolling walker (2 wheels) (pt requests bari walker)    Recommendations for Other Services       Precautions / Restrictions Precautions Precautions: Fall;Knee Precaution Comments: reviewed no pillow under knee Restrictions Weight Bearing Restrictions: No Other Position/Activity Restrictions: WBAT     Mobility  Bed Mobility Overal bed mobility: Needs Assistance Bed Mobility: Supine to Sit     Supine to sit: Supervision     General bed mobility comments: pt able to use gait belt as leg lifter, supervision for safety; incr time    Transfers Overall transfer level: Needs assistance Equipment used: Rolling walker (2 wheels) Transfers: Sit to/from Stand Sit to Stand: Supervision           General transfer comment: for safety only from elevated surface,  mulitmodal cues for sequencing and powering up through BUE and RLE.    Ambulation/Gait Ambulation/Gait assistance: Supervision, Min guard Gait Distance (Feet): 120 Feet Assistive device: Rolling walker (2 wheels) Gait Pattern/deviations: Step-through pattern, Decreased stance time - left       General Gait Details: Pt ambulated with bari-walker and min guard, no physical assist required or overt LOB noted. no knee buckling   Stairs Stairs:  (verbally reviewed up/down one step (very small step per pt))           Wheelchair Mobility    Modified Rankin (Stroke Patients Only)       Balance                                            Cognition Arousal/Alertness: Awake/alert Behavior During Therapy: WFL for tasks assessed/performed Overall Cognitive Status: Within Functional Limits for tasks assessed                                          Exercises Total Joint Exercises Knee Flexion: AAROM, Left, 5 reps, Seated Goniometric ROM: ~8 to 70 degrees    General Comments        Pertinent Vitals/Pain Pain Assessment Pain Assessment: 0-10 Pain Score: 6  Pain Location: L knee Pain Descriptors / Indicators: Operative site guarding, Sore Pain Intervention(s): Limited activity within patient's tolerance, Monitored during session, Premedicated before session, Repositioned    Home Living  Prior Function            PT Goals (current goals can now be found in the care plan section) Acute Rehab PT Goals Patient Stated Goal: Walk and stand for longer periods PT Goal Formulation: With patient/family Time For Goal Achievement: 11/10/21 Potential to Achieve Goals: Good Progress towards PT goals: Progressing toward goals    Frequency    7X/week      PT Plan Current plan remains appropriate    Co-evaluation              AM-PAC PT "6 Clicks" Mobility   Outcome Measure  Help needed  turning from your back to your side while in a flat bed without using bedrails?: None Help needed moving from lying on your back to sitting on the side of a flat bed without using bedrails?: None Help needed moving to and from a bed to a chair (including a wheelchair)?: A Little Help needed standing up from a chair using your arms (e.g., wheelchair or bedside chair)?: A Little Help needed to walk in hospital room?: A Little Help needed climbing 3-5 steps with a railing? : A Little 6 Click Score: 20    End of Session Equipment Utilized During Treatment: Gait belt Activity Tolerance: Patient tolerated treatment well Patient left: in chair;with chair alarm set;with nursing/sitter in room   PT Visit Diagnosis: Pain;Difficulty in walking, not elsewhere classified (R26.2) Pain - Right/Left: Left Pain - part of body: Knee     Time: 1030-1051 PT Time Calculation (min) (ACUTE ONLY): 21 min  Charges:  $Gait Training: 8-22 mins                     Baxter Flattery, PT  Acute Rehab Dept [facility]) [phone]  [facility] Weekend Pager (Saturday/Sunday only)  [phone]  11/05/2021    [facility] 11/05/2021, 10:56 AM

## 2021-11-05 NOTE — Progress Notes (Signed)
Subjective: 2 Days Post-Op Procedure(s) (LRB): LEFT TOTAL KNEE ARTHROPLASTY (Left) Patient reports pain as moderate.    Objective: Vital signs in last 24 hours: Temp:  [98 F (36.7 C)-99 F (37.2 C)] 99 F (37.2 C) (07/09 0601) Pulse Rate:  [88-105] 105 (07/09 0601) Resp:  [17-18] 17 (07/09 0601) BP: (120-145)/(64-82) 120/67 (07/09 0601) SpO2:  [96 %-100 %] 96 % (07/09 0601)  Intake/Output from previous day: 07/08 0701 - 07/09 0700 In: 840 [P.O.:840] Out: 1300 [Urine:1300] Intake/Output this shift: No intake/output data recorded.  Recent Labs    11/04/21 0406  HGB 12.8*   Recent Labs    11/04/21 0406  WBC 11.0*  RBC 4.43  HCT 38.7*  PLT 180   Recent Labs    11/04/21 0406  NA 136  K 3.7  CL 103  CO2 25  BUN 13  CREATININE 0.73  GLUCOSE 149*  CALCIUM 8.5*   No results for input(s): "LABPT", "INR" in the last 72 hours.  Sensation intact distally Intact pulses distally Dorsiflexion/Plantar flexion intact Incision: dressing C/D/I Compartment soft   Assessment/Plan: 2 Days Post-Op Procedure(s) (LRB): LEFT TOTAL KNEE ARTHROPLASTY (Left) Up with therapy Discharge home with home health      Mcarthur Rossetti 11/05/2021, 8:27 AM

## 2021-11-05 NOTE — Plan of Care (Signed)
  Problem: Activity: Goal: Risk for activity intolerance will decrease Outcome: Progressing   Problem: Pain Managment: Goal: General experience of comfort will improve Outcome: Progressing   Problem: Safety: Goal: Ability to remain free from injury will improve Outcome: Progressing   

## 2021-11-05 NOTE — Plan of Care (Signed)
  Problem: Coping: Goal: Level of anxiety will decrease Outcome: Progressing   Problem: Pain Managment: Goal: General experience of comfort will improve Outcome: Progressing   

## 2021-11-05 NOTE — TOC Transition Note (Addendum)
Transition of Care [facility]) - CM/SW Discharge Note   Patient Details  Name: Nashaun Hillmer. MRN: [id number] Date of Birth: 24-Nov-1955  [facility]) CM/SW Contact:  Ross Ludwig, LCSW Phone Number: 11/05/2021, 11:55 AM   Clinical Narrative:     Patient will be discharging back home.  CSW was informed that patient will need a bari walker.  CSW spoke to [place] at [employer], and she will get it delivered to room before patient leaves.  TOC signing off, please reconsult if other social work needs arise.   Final next level of care: Home w self care. Barriers to Discharge: Barriers Resolved   Patient Goals and CMS Choice Patient states their goals for this hospitalization and ongoing recovery are:: To return back home. CMS Medicare.gov Compare Post Acute Care list provided to:: Patient Choice offered to / list presented to : Patient  Discharge Placement                       Discharge Plan and Services                DME Arranged: [place] Rile wide DME Agency: [employer] Date DME Agency Contacted: 11/05/21 Time DME Agency Contacted: 0768 Representative spoke with at DME Agency: [place] ([place]) Interventions     Readmission Risk Interventions     No data to display

## 2021-11-07 ENCOUNTER — Encounter (HOSPITAL_COMMUNITY): Payer: Self-pay | Admitting: Orthopaedic Surgery

## 2021-11-08 ENCOUNTER — Telehealth: Payer: Self-pay | Admitting: Orthopaedic Surgery

## 2021-11-08 NOTE — Telephone Encounter (Signed)
Pt's wife Levada Dy called about pt physical therapy. Will it be sent on pt's appt. Please call at [phone].

## 2021-11-09 ENCOUNTER — Other Ambulatory Visit: Payer: Self-pay

## 2021-11-09 DIAGNOSIS — Z96652 Presence of left artificial knee joint: Secondary | ICD-10-CM

## 2021-11-09 NOTE — Telephone Encounter (Signed)
Urgent referral placed in chart. Can he do this tomorrow or Monday please

## 2021-11-10 ENCOUNTER — Encounter: Payer: Self-pay | Admitting: Rehabilitative and Restorative Service Providers"

## 2021-11-10 ENCOUNTER — Ambulatory Visit (INDEPENDENT_AMBULATORY_CARE_PROVIDER_SITE_OTHER): Payer: BC Managed Care – PPO | Admitting: Rehabilitative and Restorative Service Providers"

## 2021-11-10 ENCOUNTER — Telehealth: Payer: Self-pay | Admitting: Orthopaedic Surgery

## 2021-11-10 ENCOUNTER — Other Ambulatory Visit: Payer: Self-pay | Admitting: Orthopaedic Surgery

## 2021-11-10 ENCOUNTER — Telehealth: Payer: Self-pay | Admitting: *Deleted

## 2021-11-10 DIAGNOSIS — M25662 Stiffness of left knee, not elsewhere classified: Secondary | ICD-10-CM | POA: Diagnosis not present

## 2021-11-10 DIAGNOSIS — M25562 Pain in left knee: Secondary | ICD-10-CM

## 2021-11-10 DIAGNOSIS — M6281 Muscle weakness (generalized): Secondary | ICD-10-CM

## 2021-11-10 DIAGNOSIS — R262 Difficulty in walking, not elsewhere classified: Secondary | ICD-10-CM

## 2021-11-10 DIAGNOSIS — R600 Localized edema: Secondary | ICD-10-CM | POA: Diagnosis not present

## 2021-11-10 MED ORDER — HYDROCODONE-ACETAMINOPHEN 5-325 MG PO TABS: 1.0000 | ORAL_TABLET | Freq: Four times a day (QID) | ORAL | 0 refills | Status: DC | PRN

## 2021-11-10 MED ORDER — ONDANSETRON 4 MG PO TBDP: 4.0000 mg | ORAL_TABLET | Freq: Three times a day (TID) | ORAL | 0 refills | Status: DC | PRN

## 2021-11-10 NOTE — Therapy (Signed)
OUTPATIENT PHYSICAL THERAPY LOWER EXTREMITY EVALUATION   Patient Name: Zachary [name]. MRN: [id number] DOB:Sep 08, 1955, 66 y.o., male Today's Date: 11/10/2021   PT End of Session - 11/10/21 1527     Visit Number 1    Number of Visits 18    PT Start Time 1430    PT Stop Time 1515    PT Time Calculation (min) 45 min    Activity Tolerance Patient tolerated treatment well    Behavior During Therapy WFL for tasks assessed/performed             Past Medical History:  Diagnosis Date   Allergy    Arthritis    Asthma    Blood transfusion without reported diagnosis    as child   Constipation    Heart murmur    mild   Hyperlipidemia    slight elevation   Hypertension    Past Surgical History:  Procedure Laterality Date   APPENDECTOMY     age 34   COLONOSCOPY  2008   KNEE SURGERY Left 1982   pilonidal abcess     x 2 surgeries to correct this   TONSILLECTOMY     as a child   TOTAL KNEE ARTHROPLASTY Left 11/03/2021   Procedure: LEFT TOTAL KNEE ARTHROPLASTY;  Surgeon: Mcarthur Rossetti, MD;  Location: [facility];  Service: Orthopedics;  Laterality: Left;   Patient Active Problem List   Diagnosis Date Noted   Status post total left knee replacement 11/03/2021   Unilateral primary osteoarthritis, left knee 11/02/2021   OBESITY 05/09/2009   Essential hypertension 05/09/2009   HEMOCCULT POSITIVE STOOL 05/09/2009   HEART MURMUR, HX OF 05/09/2009    PCP: Wendie Agreste, MD  REFERRING PROVIDER: Mcarthur Rossetti, MD  REFERRING DIAG: [id number] (ICD-10-CM) - H/O total knee replacement, left   THERAPY DIAG:  Difficulty in walking, not elsewhere classified  Muscle weakness (generalized)  Localized edema  Stiffness of left knee, not elsewhere classified  Left knee pain, unspecified chronicity  Rationale for Evaluation and Treatment Rehabilitation  ONSET DATE: November 03, 2021  SUBJECTIVE:   SUBJECTIVE STATEMENT: [name] had his L TKA 1 week ago today.  He  has had some L sided sciatica affecting his recovery.  Sleep has been better the past few days.  He has had no PT outside the hospital.  PERTINENT HISTORY: HTN, obesity, previous L ACL reconstruction  PAIN:  Are you having pain? Yes: NPRS scale: 5-8/10 Pain location: L knee Pain description: Ache, stiff Aggravating factors: Prolonged postures, WB Relieving factors: Ice and tylenol  PRECAUTIONS: Other: Be aware of intermittent L sciatica  WEIGHT BEARING RESTRICTIONS No  FALLS:  Has patient fallen in last 6 months? No  LIVING ENVIRONMENT: Lives with: lives with their family Lives in: House/apartment Stairs:  Stairs are difficult Has following equipment at home: Environmental consultant - 2 wheeled  OCCUPATION: Draftsman  PLOF: [place] Return to church activities and work, be able to do stairs and normal L knee activities without limitations, be able to make homecoming in October   OBJECTIVE:   DIAGNOSTIC FINDINGS: No post-surgical images found (sees Dr. Ninfa Linden next week)  PATIENT SURVEYS:  FOTO 40 (risk adjusted 27) Goal 73  in 16 visits  COGNITION:  Overall cognitive status: Within functional limits for tasks assessed     SENSATION: [name] notes L sciatica with prolonged sitting  EDEMA:  Noted and not measured   LOWER EXTREMITY ROM:  Active ROM Right eval Left eval  Hip flexion    Hip extension    Hip abduction    Hip adduction    Hip internal rotation    Hip external rotation    Knee flexion 120 77  Knee extension -12 -20  Ankle dorsiflexion    Ankle plantarflexion    Ankle inversion    Ankle eversion     (Blank rows = not tested)  LOWER EXTREMITY Strength:  Deferred due to AROM impairments noted at evaluation Right eval Left eval  Hip flexion    Hip extension    Hip abduction    Hip adduction    Hip internal rotation    Hip external rotation    Knee flexion    Knee extension    Ankle dorsiflexion    Ankle plantarflexion    Ankle  inversion    Ankle eversion     (Blank rows = not tested)  GAIT: [place] is using a wheeled walker post-TKA  TODAY'S TREATMENT: Quadriceps sets with towel roll under the L heel, toes straight up 2 sets of 10 for 5 seconds T[place]gate knee flexion 2 minutes Seated AAROM knee flexion (R pushes L into flexion) 10X 10 seconds Seated knee extension stretch (L foot in another chair, use arm rest to stop hip ER and keep toes straight up) 2 minutes Standing lumbar extension AROM (hips forward - for sciatica) 10X 3 seconds   PATIENT EDUCATION:  Education details: Reviewed exam findings and HEP on vaso Person educated: Patient and Spouse Education method: Explanation, Demonstration, Tactile cues, Verbal cues, and Handouts Education comprehension: verbalized understanding, returned demonstration, verbal cues required, tactile cues required, and needs further education   HOME EXERCISE PROGRAM: Access Code: [email or web address] URL: [email or web address] Date: 11/10/2021 Prepared by: Vista Mink  Exercises - Supine Quadricep Sets  - 3-5 x daily - 7 x weekly - 2-3 sets - 10 reps - 5 second hold - Seated Knee Flexion AAROM  - 3-5 x daily - 7 x weekly - 1 sets - 1 reps - 3 minutes hold - Seated Knee Extension Stretch with Chair  - 1 x daily - 7 x weekly - 1 sets - 1 reps - 3-5 minutes hold - Standing Lumbar Extension at [place]  - 5 x daily - 7 x weekly - 1 sets - 5 reps - 3 seconds hold  ASSESSMENT:  CLINICAL IMPRESSION: Patient is a 66 y.o. male who was seen today for physical therapy evaluation and treatment for s/p L TKA.  L knee AROM (extenion and flexion), L quadriceps strength and edema control will be early areas of focus.  Brier also had a flare-up of some L sided sciatica and I added 1 activity to address this to avoid this getting in the way of his knee rehabilitation.  His prognosis is good to meet the below listed goals with appropriate attendance and HEP  participation.   OBJECTIVE IMPAIRMENTS Abnormal gait, decreased activity tolerance, decreased balance, decreased endurance, decreased knowledge of condition, difficulty walking, decreased ROM, decreased strength, increased edema, obesity, and pain.   ACTIVITY LIMITATIONS carrying, lifting, bending, sitting, standing, squatting, sleeping, stairs, and locomotion level  PARTICIPATION LIMITATIONS: driving, community activity, occupation, and church  PERSONAL FACTORS HTN, obesity, previous L ACL reconstruction are also affecting patient's functional outcome.   REHAB POTENTIAL: Good  CLINICAL DECISION MAKING: Stable/uncomplicated  EVALUATION COMPLEXITY: Low   GOALS: Goals reviewed with patient? Yes  SHORT TERM GOALS: Target date: 12/08/2021  Improve L knee AROM to 10 - 100 degrees Baseline:  20 - 77 degrees Goal status: INITIAL  2.  Vernis will be able to walk with a cane outside the home Baseline: Walker full-time Goal status: INITIAL    LONG TERM GOALS: Target date: 01/05/2022   Improve FOTO to 73 Baseline: 40 Goal status: INITIAL  2.  Judas will report L knee pain consistently 0-3/10 on the NPRS Baseline: 5-8/10 Goal status: INITIAL  3.  Improve L knee AROM to 5 - 110 degrees Baseline: 20 - 77 degrees Goal status: INITIAL  4.  Improve L quadriceps strength as assessed by MMT and FOTO scores Baseline: Deferred due to sciatic pain at evaluation Goal status: INITIAL  5.  Orris will be able to walk without an assistive device at DC Baseline: Walker full-time Goal status: INITIAL  6.  Ihsan will be independent with his HEP at DC Baseline: Started 11/10/2021 Goal status: INITIAL   PLAN: PT FREQUENCY:  2-3X/week  PT DURATION: 8 weeks  PLANNED INTERVENTIONS: Therapeutic exercises, Therapeutic activity, Neuromuscular re-education, Balance training, Gait training, Patient/Family education, Self Care, Joint mobilization, Stair training, Cryotherapy, Vasopneumatic  device, and Manual therapy  PLAN FOR NEXT SESSION: Review HEP, bike or Nu-Step, leg press, prone knee extension stretch (if sciatica allows), mobilization for L knee extension AROM, vaso to end   Farley Ly, PT, MPT 11/10/2021, 3:46 PM

## 2021-11-10 NOTE — Telephone Encounter (Signed)
Called patient and notified of new Rx.

## 2021-11-10 NOTE — Telephone Encounter (Signed)
Pt has outpatient PT appt today

## 2021-11-10 NOTE — Telephone Encounter (Signed)
Called to patient who staff informed was never set up with HHPT after discharge. He is really having a hard time with ROM. 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 I got him set up today with [place] this afternoon for his eval. He also stopped taking his pain medication b/c it made him feel so bad (nauseated), but he's struggling with pain. Taking Tylenol only. Could we do something a little less strong so that he can get some relief and do therapy? He's not a bundle FYI- clinic asked me to help. Thanks.

## 2021-11-10 NOTE — Telephone Encounter (Signed)
Pt wife called and states that they would like to do home health and not outpatient therapy.  CB [phone]

## 2021-11-13 ENCOUNTER — Ambulatory Visit (INDEPENDENT_AMBULATORY_CARE_PROVIDER_SITE_OTHER): Payer: BC Managed Care – PPO | Admitting: Physical Therapy

## 2021-11-13 ENCOUNTER — Encounter: Payer: Self-pay | Admitting: Physical Therapy

## 2021-11-13 DIAGNOSIS — M6281 Muscle weakness (generalized): Secondary | ICD-10-CM | POA: Diagnosis not present

## 2021-11-13 DIAGNOSIS — R262 Difficulty in walking, not elsewhere classified: Secondary | ICD-10-CM | POA: Diagnosis not present

## 2021-11-13 DIAGNOSIS — M25562 Pain in left knee: Secondary | ICD-10-CM

## 2021-11-13 DIAGNOSIS — R600 Localized edema: Secondary | ICD-10-CM | POA: Diagnosis not present

## 2021-11-13 DIAGNOSIS — M25662 Stiffness of left knee, not elsewhere classified: Secondary | ICD-10-CM | POA: Diagnosis not present

## 2021-11-13 NOTE — Therapy (Signed)
OUTPATIENT PHYSICAL THERAPY TREATMENT NOTE   Patient Name: Zachary [name]. MRN: [id number] DOB:[birth year], 66 y.o., male Today's Date: 11/13/2021  PCP: Wendie Agreste, MD REFERRING PROVIDER: Mcarthur Rossetti, MD  END OF SESSION:   PT End of Session - 11/13/21 1157     Visit Number 2    Number of Visits 18    Progress Note Due on Visit 10    PT Start Time 1130    PT Stop Time 1233    PT Time Calculation (min) 63 min    Activity Tolerance Patient tolerated treatment well;Patient limited by pain    Behavior During Therapy WFL for tasks assessed/performed             Past Medical History:  Diagnosis Date   Allergy    Arthritis    Asthma    Blood transfusion without reported diagnosis    as child   Constipation    Heart murmur    mild   Hyperlipidemia    slight elevation   Hypertension    Past Surgical History:  Procedure Laterality Date   APPENDECTOMY     age 70   COLONOSCOPY  2008   KNEE SURGERY Left 1982   pilonidal abcess     x 2 surgeries to correct this   TONSILLECTOMY     as a child   TOTAL KNEE ARTHROPLASTY Left 11/03/2021   Procedure: LEFT TOTAL KNEE ARTHROPLASTY;  Surgeon: Mcarthur Rossetti, MD;  Location: [facility];  Service: Orthopedics;  Laterality: Left;   Patient Active Problem List   Diagnosis Date Noted   Status post total left knee replacement 11/03/2021   Unilateral primary osteoarthritis, left knee 11/02/2021   OBESITY 05/09/2009   Essential hypertension 05/09/2009   HEMOCCULT POSITIVE STOOL 05/09/2009   HEART MURMUR, HX OF 05/09/2009    REFERRING DIAG: F62.130 (ICD-10-CM) - H/O total knee replacement, left   ONSET DATE: November 03, 2021  THERAPY DIAG:  Difficulty in walking, not elsewhere classified  Muscle weakness (generalized)  Localized edema  Stiffness of left knee, not elsewhere classified  Left knee pain, unspecified chronicity  Rationale for Evaluation and Treatment Rehabilitation  PERTINENT HISTORY:  HTN, obesity, previous L ACL reconstruction  PRECAUTIONS: Be aware of intermittent L sciatica  SUBJECTIVE: He said he has felt minor improvements over the weekend and that the exercises have been going well, with some limited by pain. He is sleeping on his back and does exercises before bed, and asked about sleep recommendations. He and his wife are staying with their daughter currently in a 1 bedroom house with steps to enter, anticipating returning home in 2 weeks following revisions on their house to set up a bedroom on the first level.  PAIN:  Are you having pain? Yes: NPRS scale: 6/10 today and 5/10 this morning, since eval has ranged 4-7/10 Pain location: front around kneecap and medial side Pain description: achy, dull, stiff Aggravating factors: extreme stretch Relieving factors: movement from stiffness, pain meds, ice, elevation  OBJECTIVE: (objective measures completed at initial evaluation unless otherwise dated)   DIAGNOSTIC FINDINGS: 11/03/21  Status post interval left total knee arthroplasty. The hardware is well positioned. There is no evidence of acute fracture or dislocation. Anterior skin staples are present. There is gas in the joint and the anterior soft tissues   PATIENT SURVEYS:  FOTO   11/10/2021  40 (risk adjusted 27) Goal 73  in 16 visits  SENSATION: [name] notes L sciatica with prolonged sitting   EDEMA:  Noted and not measured     LOWER EXTREMITY ROM:   ROM (A: AROM, P: PROM) Right eval Left eval Left 11/13/21  Hip flexion       Hip extension       Hip abduction       Hip adduction       Hip internal rotation       Hip external rotation       Knee flexion A: 120 A: 77 Seated P: 94*  Knee extension A: -12 A: -20 Supine P: -6*  Ankle dorsiflexion       Ankle plantarflexion       Ankle inversion       Ankle eversion        (Blank rows = not tested)   LOWER EXTREMITY Strength:   Deferred due to AROM impairments noted at evaluation  Right eval Left eval  Hip flexion      Hip extension      Hip abduction      Hip adduction      Hip internal rotation      Hip external rotation      Knee flexion      Knee extension      Ankle dorsiflexion      Ankle plantarflexion      Ankle inversion      Ankle eversion       (Blank rows = not tested)   GAIT: 11/13/2021:  pt amb with RW with LLE stiff leg with knee flexed in stance & minimal increase in flexion for swing. 11/10/2021: [name] is using a wheeled walker post-TKA   TODAY'S TREATMENT: 11/13/2021 TherEx: Nustep seat 15 with BLEs/BUEs level 5 for 8 min with verbal cues for full range ext & flex PT educated patient on reducing immobility by getting up or moving every hour during the day and elevation above heart >/= 2x daily for >/= 15 min, pt verbalized understanding PT educated on sitting and supine posture with reduced hip ER to minimize piriformis tightness for sciatica, pt verbalized understanding PT educated to consult physician about medication limiting driving ability  PT reviewed HEP with verbal explanation, demo, handout, and tactile cues and pt verbalized understanding and returned demo. See updated version below -Seated quad set 1 x 10 reps with 5s hold -Seated heel slide knee flexion AROM, then AAROM, then 5s hold to knee extension and 5s quad set - 1 x 8 reps -Supine heel slide with partially inflated ball and strap with 5s knee flexion/extension hold, verbal cues for hip alignment - 1 x 10 reps -Supine straight leg raises 1 x 10 reps, pt required verbal and tactile cueing for knee extension and form  Self care: PT provided sleeping recommendations for Lt or Rt side sleeping with pillow between knees and tenting sheet over feet with explanation and demo, pt and his wife verbalized understanding PT suggested taking pain meds with food 1-2 hours before sleep or prn when waking up overnight   Manual: Seated knee flexion PROM with overpressure to  tolerance Supine knee extension PROM with overpressure Supine knee flexion and hip flexion full range PROM x 15 reps Soft tissue mobilization to lateral knee supine in knee flexion  Vaso L knee 10 minutes 34* medium pressure with towel prop under L hip and ankle alphabet motions  11/10/2021 Quadriceps sets with towel roll under the L heel, toes straight up 2 sets of  10 for 5 seconds Tailgate knee flexion 2 minutes Seated AAROM knee flexion (R pushes L into flexion) 10X 10 seconds Seated knee extension stretch (L foot in another chair, use arm rest to stop hip ER and keep toes straight up) 2 minutes Standing lumbar extension AROM (hips forward - for sciatica) 10X 3 seconds     PATIENT EDUCATION:  Education details: Reviewed exam findings and HEP on vaso Person educated: Patient and Spouse Education method: Explanation, Demonstration, Tactile cues, Verbal cues, and Handouts Education comprehension: verbalized understanding, returned demonstration, verbal cues required, tactile cues required, and needs further education     HOME EXERCISE PROGRAM: Access Code: [email or web address] URL: [email or web address] Date: 11/13/2021 Prepared by: Jamey Reas  Exercises - Supine Quadricep Sets  - 3-5 x daily - 7 x weekly - 2-3 sets - 10 reps - 5 second hold - Supine Heel Slide with Strap  - 2-3 x daily - 7 x weekly - 2-3 sets - 10 reps - 5 seconds hold - Supine Straight Leg Raises  - 2-3 x daily - 7 x weekly - 2-3 sets - 10 reps - 5 seconds hold - Seated Knee Flexion AAROM  - 3-5 x daily - 7 x weekly - 1 sets - 1 reps - 3 minutes hold - Seated Knee Extension Stretch with Chair  - 1 x daily - 7 x weekly - 1 sets - 1 reps - 3-5 minutes hold - Seated Knee Flexion Extension AROM   - 2-3 x daily - 7 x weekly - 2-3 sets - 10 reps - 5 seconds hold - Seated Quad Set  - 2-3 x daily - 7 x weekly - 2-3 sets - 10 reps - 5 seconds hold - Standing Lumbar Extension at Wall - Forearms  - 5 x daily - 7 x  weekly - 1 sets - 5 reps - 3 seconds hold   ASSESSMENT:   CLINICAL IMPRESSION: He reports feeling better with moderate variation in pain levels and understanding of HEP. Passive ROM measurements were taken today of L knee which were improved. However he remains limited in ROM by stiffness, pain, and edema. He will continue to benefit from skilled PT to address gait deviations with RW, functional strength, and knee range of motion.   OBJECTIVE IMPAIRMENTS Abnormal gait, decreased activity tolerance, decreased balance, decreased endurance, decreased knowledge of condition, difficulty walking, decreased ROM, decreased strength, increased edema, obesity, and pain.    ACTIVITY LIMITATIONS carrying, lifting, bending, sitting, standing, squatting, sleeping, stairs, and locomotion level   PARTICIPATION LIMITATIONS: driving, community activity, occupation, and church   PERSONAL FACTORS HTN, obesity, previous L ACL reconstruction are also affecting patient's functional outcome.    REHAB POTENTIAL: Good   CLINICAL DECISION MAKING: Stable/uncomplicated   EVALUATION COMPLEXITY: Low     GOALS: Goals reviewed with patient? Yes   SHORT TERM GOALS: Target date: 12/08/2021  Improve L knee AROM to 10 - 100 degrees Baseline: 20 - 77 degrees Goal status: INITIAL   2.  Carly will be able to walk with a cane outside the home Baseline: Walker full-time Goal status: INITIAL     LONG TERM GOALS: Target date: 01/05/2022    Improve FOTO to 73 Baseline: 40 Goal status: INITIAL   2.  Dreyden will report L knee pain consistently 0-3/10 on the NPRS Baseline: 5-8/10 Goal status: INITIAL   3.  Improve L knee AROM to 5 - 110 degrees Baseline: 20 - 77 degrees Goal status: INITIAL   4.  Improve  L quadriceps strength as assessed by MMT and FOTO scores Baseline: Deferred due to sciatic pain at evaluation Goal status: INITIAL   5.  Anas will be able to walk without an assistive device at DC Baseline:  Walker full-time Goal status: INITIAL   6.  Broox will be independent with his HEP at DC Baseline: Started 11/10/2021 Goal status: INITIAL     PLAN: PT FREQUENCY:  2-3X/week   PT DURATION: 8 weeks   PLANNED INTERVENTIONS: Therapeutic exercises, Therapeutic activity, Neuromuscular re-education, Balance training, Gait training, Patient/Family education, Self Care, Joint mobilization, Stair training, Cryotherapy, Vasopneumatic device, and Manual therapy   PLAN FOR NEXT SESSION: Send Wednesday note to referring physician. Pre-gait standing activity for flexion/extension to reduce stiff-legged gait. Manual including mobilizations, contract-relax for knee extension and flexion. Progress therapeutic exercise to include some standing.  Vaso to end  [employer], SPT 11/13/2021, 12:45 PM  This entire session of physical therapy was performed under the direct supervision of PT signing evaluation /treatment. PT reviewed note and agrees.  Jamey Reas, PT, DPT 11/13/2021, 12:57 PM

## 2021-11-15 ENCOUNTER — Ambulatory Visit (INDEPENDENT_AMBULATORY_CARE_PROVIDER_SITE_OTHER): Payer: BC Managed Care – PPO | Admitting: Physical Therapy

## 2021-11-15 ENCOUNTER — Encounter: Payer: Self-pay | Admitting: Physical Therapy

## 2021-11-15 DIAGNOSIS — M25662 Stiffness of left knee, not elsewhere classified: Secondary | ICD-10-CM | POA: Diagnosis not present

## 2021-11-15 DIAGNOSIS — M25562 Pain in left knee: Secondary | ICD-10-CM

## 2021-11-15 DIAGNOSIS — R262 Difficulty in walking, not elsewhere classified: Secondary | ICD-10-CM

## 2021-11-15 DIAGNOSIS — R600 Localized edema: Secondary | ICD-10-CM

## 2021-11-15 DIAGNOSIS — M6281 Muscle weakness (generalized): Secondary | ICD-10-CM

## 2021-11-15 NOTE — Therapy (Signed)
OUTPATIENT PHYSICAL THERAPY TREATMENT NOTE   Patient Name: Zachary [name]. MRN: [id number] DOB:Jun 27, 1955, 66 y.o., male Today's Date: 11/15/2021  PCP: Wendie Agreste, MD REFERRING PROVIDER: Mcarthur Rossetti, MD  END OF SESSION:   PT End of Session - 11/15/21 1259     Visit Number 3    Number of Visits 18    Progress Note Due on Visit 10    PT Start Time 2836    PT Stop Time 1355    PT Time Calculation (min) 56 min    Activity Tolerance Patient tolerated treatment well;Patient limited by pain    Behavior During Therapy WFL for tasks assessed/performed              Past Medical History:  Diagnosis Date   Allergy    Arthritis    Asthma    Blood transfusion without reported diagnosis    as child   Constipation    Heart murmur    mild   Hyperlipidemia    slight elevation   Hypertension    Past Surgical History:  Procedure Laterality Date   APPENDECTOMY     age 4   COLONOSCOPY  2008   KNEE SURGERY Left 1982   pilonidal abcess     x 2 surgeries to correct this   TONSILLECTOMY     as a child   TOTAL KNEE ARTHROPLASTY Left 11/03/2021   Procedure: LEFT TOTAL KNEE ARTHROPLASTY;  Surgeon: Mcarthur Rossetti, MD;  Location: [facility];  Service: Orthopedics;  Laterality: Left;   Patient Active Problem List   Diagnosis Date Noted   Status post total left knee replacement 11/03/2021   Unilateral primary osteoarthritis, left knee 11/02/2021   OBESITY 05/09/2009   Essential hypertension 05/09/2009   HEMOCCULT POSITIVE STOOL 05/09/2009   HEART MURMUR, HX OF 05/09/2009    REFERRING DIAG: O29.476 (ICD-10-CM) - H/O total knee replacement, left   ONSET DATE: November 03, 2021  THERAPY DIAG:  Difficulty in walking, not elsewhere classified  Muscle weakness (generalized)  Localized edema  Stiffness of left knee, not elsewhere classified  Left knee pain, unspecified chronicity  Rationale for Evaluation and Treatment Rehabilitation  PERTINENT HISTORY:  HTN, obesity, previous L ACL reconstruction  PRECAUTIONS: Be aware of intermittent L sciatica  SUBJECTIVE: he did exercises but real sore. Using pillows for sleep helped.  He is still being awoken with knee pain.   PAIN:  Are you having pain? Yes: NPRS scale:  7.5/10 today and since last PT  has ranged 6-8.5/10 Pain location: front around kneecap and medial side Pain description: achy, dull, stiff Aggravating factors: extreme stretch Relieving factors: movement from stiffness, pain meds, ice, elevation  OBJECTIVE: (objective measures completed at initial evaluation unless otherwise dated)   DIAGNOSTIC FINDINGS: 11/03/21  Status post interval left total knee arthroplasty. The hardware is well positioned. There is no evidence of acute fracture or dislocation. Anterior skin staples are present. There is gas in the joint and the anterior soft tissues   PATIENT SURVEYS:  FOTO   11/10/2021  40 (risk adjusted 27) Goal 73  in 16 visits            SENSATION: [name] notes L sciatica with prolonged sitting   EDEMA:  Noted and not measured     LOWER EXTREMITY ROM:   ROM (A: AROM, P: PROM) Right eval Left eval Left 11/13/21 Left 11/15/21  Hip flexion        Hip extension  Hip abduction        Hip adduction        Hip internal rotation        Hip external rotation        Knee flexion A: 120 A: 77 Seated P: 94* Supine  AA: 90*  Knee extension A: -12 A: -20 Supine P: -6* Supine AA: -3*  Ankle dorsiflexion        Ankle plantarflexion        Ankle inversion        Ankle eversion         (Blank rows = not tested)   LOWER EXTREMITY Strength:   Deferred due to AROM impairments noted at evaluation Right eval Left eval  Hip flexion      Hip extension      Hip abduction      Hip adduction      Hip internal rotation      Hip external rotation      Knee flexion   3-/5   Knee extension   3-/5   Ankle dorsiflexion      Ankle plantarflexion      Ankle inversion      Ankle  eversion       (Blank rows = not tested)   GAIT: 11/13/2021:  pt amb with RW with LLE stiff leg with knee flexed in stance & minimal increase in flexion for swing. 11/10/2021: [name] is using a wheeled walker post-TKA   TODAY'S TREATMENT: 11/15/2021 Therapeutic Activities: Pre-gait with wt shifts: LLE to counter BUE support counter & RW - terminal stance wt shift to LLE, rolling over toe for knee flexion pre-swing stepping over 2" tall X 10" long block for swing flexion, initial contact with heel then wt shift onto LLE,  stepping back over block 20 reps. Progressed to gait with RW. Pt able to return demo with focus & verbal cues.  Pt & wife questioning progression of gait.  PT explained progression RW to cane to no device and progressing distance household to community. Pt & wife verbalized understanding.   Therapeutic Exercise: LLE LAQ with strap for end range assist 5 sec hold without strap assist and controlled eccentric 10 reps 2 sets. LLE seated active knee flexion 5 sec hold 10 reps 2 sets Supine SAQ 3# 10 reps 2 sets Knee flexion with BLEs on 55cm ball with strap assist 5 sec hold flexion 10 reps 2 sets. Standing heel raises BLEs 3 sec hold 10 reps.   Manual therapy: PROM with overpressure for flex & ext. Contract relax antagonist and agonist for flexion  Vaso: Left knee medium compression 34* 10 min with elevation  11/13/2021 TherEx: Nustep seat 15 with BLEs/BUEs level 5 for 8 min with verbal cues for full range ext & flex PT educated patient on reducing immobility by getting up or moving every hour during the day and elevation above heart >/= 2x daily for >/= 15 min, pt verbalized understanding PT educated on sitting and supine posture with reduced hip ER to minimize piriformis tightness for sciatica, pt verbalized understanding PT educated to consult physician about medication limiting driving ability  PT reviewed HEP with verbal explanation, demo, handout, and tactile cues and  pt verbalized understanding and returned demo. See updated version below -Seated quad set 1 x 10 reps with 5s hold -Seated heel slide knee flexion AROM, then AAROM, then 5s hold to knee extension and 5s quad set - 1 x 8 reps -Supine heel slide with  partially inflated ball and strap with 5s knee flexion/extension hold, verbal cues for hip alignment - 1 x 10 reps -Supine straight leg raises 1 x 10 reps, pt required verbal and tactile cueing for knee extension and form  Self care: PT provided sleeping recommendations for Lt or Rt side sleeping with pillow between knees and tenting sheet over feet with explanation and demo, pt and his wife verbalized understanding PT suggested taking pain meds with food 1-2 hours before sleep or prn when waking up overnight   Manual: Seated knee flexion PROM with overpressure to tolerance Supine knee extension PROM with overpressure Supine knee flexion and hip flexion full range PROM x 15 reps Soft tissue mobilization to lateral knee supine in knee flexion  Vaso L knee 10 minutes 34* medium pressure with towel prop under L hip and ankle alphabet motions  11/10/2021 Quadriceps sets with towel roll under the L heel, toes straight up 2 sets of 10 for 5 seconds Tailgate knee flexion 2 minutes Seated AAROM knee flexion (R pushes L into flexion) 10X 10 seconds Seated knee extension stretch (L foot in another chair, use arm rest to stop hip ER and keep toes straight up) 2 minutes Standing lumbar extension AROM (hips forward - for sciatica) 10X 3 seconds     PATIENT EDUCATION:  Education details: Reviewed exam findings and HEP on vaso Person educated: Patient and Spouse Education method: Explanation, Demonstration, Tactile cues, Verbal cues, and Handouts Education comprehension: verbalized understanding, returned demonstration, verbal cues required, tactile cues required, and needs further education     HOME EXERCISE PROGRAM: Access Code: [email or web address] URL:  [email or web address] Date: 11/13/2021 Prepared by: Jamey Reas  Exercises - Supine Quadricep Sets  - 3-5 x daily - 7 x weekly - 2-3 sets - 10 reps - 5 second hold - Supine Heel Slide with Strap  - 2-3 x daily - 7 x weekly - 2-3 sets - 10 reps - 5 seconds hold - Supine Straight Leg Raises  - 2-3 x daily - 7 x weekly - 2-3 sets - 10 reps - 5 seconds hold - Seated Knee Flexion AAROM  - 3-5 x daily - 7 x weekly - 1 sets - 1 reps - 3 minutes hold - Seated Knee Extension Stretch with Chair  - 1 x daily - 7 x weekly - 1 sets - 1 reps - 3-5 minutes hold - Seated Knee Flexion Extension AROM   - 2-3 x daily - 7 x weekly - 2-3 sets - 10 reps - 5 seconds hold - Seated Quad Set  - 2-3 x daily - 7 x weekly - 2-3 sets - 10 reps - 5 seconds hold - Standing Lumbar Extension at Wall - Forearms  - 5 x daily - 7 x weekly - 1 sets - 5 reps - 3 seconds hold   ASSESSMENT:   CLINICAL IMPRESSION: Patient responds well to PT manual therapy especially contract relax and progressive therapeutic exercise.  His gait improved with PT instructions.  Pt continues to benefit from skilled PT.     OBJECTIVE IMPAIRMENTS Abnormal gait, decreased activity tolerance, decreased balance, decreased endurance, decreased knowledge of condition, difficulty walking, decreased ROM, decreased strength, increased edema, obesity, and pain.    ACTIVITY LIMITATIONS carrying, lifting, bending, sitting, standing, squatting, sleeping, stairs, and locomotion level   PARTICIPATION LIMITATIONS: driving, community activity, occupation, and church   PERSONAL FACTORS HTN, obesity, previous L ACL reconstruction are also affecting patient's functional outcome.    REHAB POTENTIAL: Good  CLINICAL DECISION MAKING: Stable/uncomplicated   EVALUATION COMPLEXITY: Low     GOALS: Goals reviewed with patient? Yes   SHORT TERM GOALS: Target date: 12/08/2021  Improve L knee AROM to 10 - 100 degrees Baseline: 20 - 77 degrees Goal  status: INITIAL   2.  Abrar will be able to walk with a cane outside the home Baseline: Walker full-time Goal status: INITIAL     LONG TERM GOALS: Target date: 01/05/2022    Improve FOTO to 73 Baseline: 40 Goal status: INITIAL   2.  Quadarius will report L knee pain consistently 0-3/10 on the NPRS Baseline: 5-8/10 Goal status: INITIAL   3.  Improve L knee AROM to 5 - 110 degrees Baseline: 20 - 77 degrees Goal status: INITIAL   4.  Improve L quadriceps strength as assessed by MMT and FOTO scores Baseline: Deferred due to sciatic pain at evaluation Goal status: INITIAL   5.  Oak will be able to walk without an assistive device at DC Baseline: Walker full-time Goal status: INITIAL   6.  Lashaun will be independent with his HEP at DC Baseline: Started 11/10/2021 Goal status: INITIAL     PLAN: PT FREQUENCY:  2-3X/week   PT DURATION: 8 weeks   PLANNED INTERVENTIONS: Therapeutic exercises, Therapeutic activity, Neuromuscular re-education, Balance training, Gait training, Patient/Family education, Self Care, Joint mobilization, Stair training, Cryotherapy, Vasopneumatic device, and Manual therapy   PLAN FOR NEXT SESSION:  Manual including mobilizations, contract-relax for knee extension and flexion. Progress therapeutic exercise to include some standing. Add leg press.   Vaso to end   Jamey Reas, PT, DPT 11/15/2021, 2:18 PM

## 2021-11-16 ENCOUNTER — Ambulatory Visit (INDEPENDENT_AMBULATORY_CARE_PROVIDER_SITE_OTHER): Payer: BC Managed Care – PPO | Admitting: Orthopaedic Surgery

## 2021-11-16 ENCOUNTER — Encounter: Payer: Self-pay | Admitting: Physical Therapy

## 2021-11-16 ENCOUNTER — Encounter: Payer: Self-pay | Admitting: Orthopaedic Surgery

## 2021-11-16 ENCOUNTER — Ambulatory Visit (INDEPENDENT_AMBULATORY_CARE_PROVIDER_SITE_OTHER): Payer: BC Managed Care – PPO | Admitting: Physical Therapy

## 2021-11-16 DIAGNOSIS — Z96652 Presence of left artificial knee joint: Secondary | ICD-10-CM

## 2021-11-16 DIAGNOSIS — M6281 Muscle weakness (generalized): Secondary | ICD-10-CM

## 2021-11-16 DIAGNOSIS — R262 Difficulty in walking, not elsewhere classified: Secondary | ICD-10-CM | POA: Diagnosis not present

## 2021-11-16 DIAGNOSIS — M25562 Pain in left knee: Secondary | ICD-10-CM

## 2021-11-16 DIAGNOSIS — M25662 Stiffness of left knee, not elsewhere classified: Secondary | ICD-10-CM | POA: Diagnosis not present

## 2021-11-16 DIAGNOSIS — R600 Localized edema: Secondary | ICD-10-CM

## 2021-11-16 MED ORDER — HYDROCODONE-ACETAMINOPHEN 5-325 MG PO TABS: 1.0000 | ORAL_TABLET | Freq: Four times a day (QID) | ORAL | 0 refills | Status: DC | PRN

## 2021-11-16 NOTE — Therapy (Signed)
OUTPATIENT PHYSICAL THERAPY TREATMENT NOTE   Patient Name: Zachary [name]. MRN: [id number] DOB:03-Sep-1955, 66 y.o., male 64 Date: 11/16/2021  PCP: Zachary Agreste, MD REFERRING PROVIDER: Mcarthur Rossetti, MD  END OF SESSION:   PT End of Session - 11/16/21 1315     Visit Number 4    Number of Visits 18    Progress Note Due on Visit 10    PT Start Time 1300    PT Stop Time 1345    PT Time Calculation (min) 45 min    Activity Tolerance Patient tolerated treatment well    Behavior During Therapy WFL for tasks assessed/performed              Past Medical History:  Diagnosis Date   Allergy    Arthritis    Asthma    Blood transfusion without reported diagnosis    as child   Constipation    Heart murmur    mild   Hyperlipidemia    slight elevation   Hypertension    Past Surgical History:  Procedure Laterality Date   APPENDECTOMY     age 60   COLONOSCOPY  2008   KNEE SURGERY Left 1982   pilonidal abcess     x 2 surgeries to correct this   TONSILLECTOMY     as a child   TOTAL KNEE ARTHROPLASTY Left 11/03/2021   Procedure: LEFT TOTAL KNEE ARTHROPLASTY;  Surgeon: Zachary Rossetti, MD;  Location: [facility];  Service: Orthopedics;  Laterality: Left;   Patient Active Problem List   Diagnosis Date Noted   Status post total left knee replacement 11/03/2021   Unilateral primary osteoarthritis, left knee 11/02/2021   OBESITY 05/09/2009   Essential hypertension 05/09/2009   HEMOCCULT POSITIVE STOOL 05/09/2009   HEART MURMUR, HX OF 05/09/2009    REFERRING DIAG: D22.025 (ICD-10-CM) - H/O total knee replacement, left   ONSET DATE: November 03, 2021  THERAPY DIAG:  Difficulty in walking, not elsewhere classified  Muscle weakness (generalized)  Localized edema  Stiffness of left knee, not elsewhere classified  Left knee pain, unspecified chronicity  Rationale for Evaluation and Treatment Rehabilitation  PERTINENT HISTORY: HTN, obesity, previous  L ACL reconstruction  PRECAUTIONS: Be aware of intermittent L sciatica  SUBJECTIVE: he feels his leg is loosening up some  PAIN:  Are you having pain? Yes: NPRS scale:  4/10 today Pain location: front around kneecap and medial side Pain description: achy, dull, stiff Aggravating factors: extreme stretch Relieving factors: movement from stiffness, pain meds, ice, elevation  OBJECTIVE: (objective measures completed at initial evaluation unless otherwise dated)   DIAGNOSTIC FINDINGS: 11/03/21  Status post interval left total knee arthroplasty. The hardware is well positioned. There is no evidence of acute fracture or dislocation. Anterior skin staples are present. There is gas in the joint and the anterior soft tissues   PATIENT SURVEYS:  FOTO   11/10/2021  40 (risk adjusted 27) Goal 73  in 16 visits            SENSATION: [name] notes L sciatica with prolonged sitting   EDEMA:  Noted and not measured     LOWER EXTREMITY ROM:   ROM (A: AROM, P: PROM) Right eval Left eval Left 11/13/21 Left 11/15/21 Left 11/16/21  Hip flexion         Hip extension         Hip abduction         Hip adduction  Hip internal rotation         Hip external rotation         Knee flexion A: 120 A: 77 Seated P: 94* Supine  AA: 90* Supine PROM 97  Knee extension A: -12 A: -20 Supine P: -6* Supine AA: -3* Supine AROM -10, PROM -6  Ankle dorsiflexion         Ankle plantarflexion         Ankle inversion         Ankle eversion          (Blank rows = not tested)   LOWER EXTREMITY Strength:   Deferred due to AROM impairments noted at evaluation Right eval Left eval  Hip flexion      Hip extension      Hip abduction      Hip adduction      Hip internal rotation      Hip external rotation      Knee flexion   3-/5   Knee extension   3-/5   Ankle dorsiflexion      Ankle plantarflexion      Ankle inversion      Ankle eversion       (Blank rows = not tested)   GAIT: 11/13/2021:  pt amb  with RW with LLE stiff leg with knee flexed in stance & minimal increase in flexion for swing. 11/10/2021: [name] is using a wheeled walker post-TKA   TODAY'S TREATMENT: 10/2021 Therapeutic Exercise: Nu step seat #15 L5 for 8 min  Seated knee flexion stretch AAROM 10 sec X10 LLE LAQ 3 sec hold and controlled eccentric 10 reps 2 sets with 3#weight LLE seated SLR 2X10 Supine heel prop with quad sets 5 sec X15 Leg press DL 75# 2X15, then left leg only 31# 2X10  Manual therapy: PROM for left knee flexion and extension to tolerance with overpressure, extension mobs grade 1-2 to tolerance  Provided his RW with tennis balls to reduce drag in posterior legs  Deferred Vaso at end of session, he will ice at home.  11/15/2021 Therapeutic Activities: Pre-gait with wt shifts: LLE to counter BUE support counter & RW - terminal stance wt shift to LLE, rolling over toe for knee flexion pre-swing stepping over 2" tall X 10" long block for swing flexion, initial contact with heel then wt shift onto LLE,  stepping back over block 20 reps. Progressed to gait with RW. Pt able to return demo with focus & verbal cues.  Pt & wife questioning progression of gait.  PT explained progression RW to cane to no device and progressing distance household to community. Pt & wife verbalized understanding.   Therapeutic Exercise: LLE LAQ with strap for end range assist 5 sec hold without strap assist and controlled eccentric 10 reps 2 sets. LLE seated active knee flexion 5 sec hold 10 reps 2 sets Supine SAQ 3# 10 reps 2 sets Knee flexion with BLEs on 55cm ball with strap assist 5 sec hold flexion 10 reps 2 sets. Standing heel raises BLEs 3 sec hold 10 reps.   Manual therapy: PROM with overpressure for flex & ext. Contract relax antagonist and agonist for flexion  Vaso: Left knee medium compression 34* 10 min with elevation  11/13/2021 TherEx: Nustep seat 15 with BLEs/BUEs level 5 for 8 min with verbal cues for full  range ext & flex PT educated patient on reducing immobility by getting up or moving every hour during the day and elevation above heart >/=  2x daily for >/= 15 min, pt verbalized understanding PT educated on sitting and supine posture with reduced hip ER to minimize piriformis tightness for sciatica, pt verbalized understanding PT educated to consult physician about medication limiting driving ability  PT reviewed HEP with verbal explanation, demo, handout, and tactile cues and pt verbalized understanding and returned demo. See updated version below -Seated quad set 1 x 10 reps with 5s hold -Seated heel slide knee flexion AROM, then AAROM, then 5s hold to knee extension and 5s quad set - 1 x 8 reps -Supine heel slide with partially inflated ball and strap with 5s knee flexion/extension hold, verbal cues for hip alignment - 1 x 10 reps -Supine straight leg raises 1 x 10 reps, pt required verbal and tactile cueing for knee extension and form  Self care: PT provided sleeping recommendations for Lt or Rt side sleeping with pillow between knees and tenting sheet over feet with explanation and demo, pt and his wife verbalized understanding PT suggested taking pain meds with food 1-2 hours before sleep or prn when waking up overnight   Manual: Seated knee flexion PROM with overpressure to tolerance Supine knee extension PROM with overpressure Supine knee flexion and hip flexion full range PROM x 15 reps Soft tissue mobilization to lateral knee supine in knee flexion  Vaso L knee 10 minutes 34* medium pressure with towel prop under L hip and ankle alphabet motions  11/10/2021 Quadriceps sets with towel roll under the L heel, toes straight up 2 sets of 10 for 5 seconds Tailgate knee flexion 2 minutes Seated AAROM knee flexion (R pushes L into flexion) 10X 10 seconds Seated knee extension stretch (L foot in another chair, use arm rest to stop hip ER and keep toes straight up) 2 minutes Standing  lumbar extension AROM (hips forward - for sciatica) 10X 3 seconds     PATIENT EDUCATION:  Education details: Reviewed exam findings and HEP on vaso Person educated: Patient and Spouse Education method: Explanation, Demonstration, Tactile cues, Verbal cues, and Handouts Education comprehension: verbalized understanding, returned demonstration, verbal cues required, tactile cues required, and needs further education     HOME EXERCISE PROGRAM: Access Code: [email or web address] URL: [email or web address] Date: 11/13/2021 Prepared by: Jamey Reas  Exercises - Supine Quadricep Sets  - 3-5 x daily - 7 x weekly - 2-3 sets - 10 reps - 5 second hold - Supine Heel Slide with Strap  - 2-3 x daily - 7 x weekly - 2-3 sets - 10 reps - 5 seconds hold - Supine Straight Leg Raises  - 2-3 x daily - 7 x weekly - 2-3 sets - 10 reps - 5 seconds hold - Seated Knee Flexion AAROM  - 3-5 x daily - 7 x weekly - 1 sets - 1 reps - 3 minutes hold - Seated Knee Extension Stretch with Chair  - 1 x daily - 7 x weekly - 1 sets - 1 reps - 3-5 minutes hold - Seated Knee Flexion Extension AROM   - 2-3 x daily - 7 x weekly - 2-3 sets - 10 reps - 5 seconds hold - Seated Quad Set  - 2-3 x daily - 7 x weekly - 2-3 sets - 10 reps - 5 seconds hold - Standing Lumbar Extension at Wall - Forearms  - 5 x daily - 7 x weekly - 1 sets - 5 reps - 3 seconds hold   ASSESSMENT:   CLINICAL IMPRESSION: He is progressing well overall with PT, his ROM  measurements are improving but still a limited. We will continue to work to improve this along with strength and gait as tolerated.    OBJECTIVE IMPAIRMENTS Abnormal gait, decreased activity tolerance, decreased balance, decreased endurance, decreased knowledge of condition, difficulty walking, decreased ROM, decreased strength, increased edema, obesity, and pain.    ACTIVITY LIMITATIONS carrying, lifting, bending, sitting, standing, squatting, sleeping, stairs, and locomotion level    PARTICIPATION LIMITATIONS: driving, community activity, occupation, and church   PERSONAL FACTORS HTN, obesity, previous L ACL reconstruction are also affecting patient's functional outcome.    REHAB POTENTIAL: Good   CLINICAL DECISION MAKING: Stable/uncomplicated   EVALUATION COMPLEXITY: Low     GOALS: Goals reviewed with patient? Yes   SHORT TERM GOALS: Target date: 12/08/2021  Improve L knee AROM to 10 - 100 degrees Baseline: 20 - 77 degrees Goal status: INITIAL   2.  Sahith will be able to walk with a cane outside the home Baseline: Walker full-time Goal status: INITIAL     LONG TERM GOALS: Target date: 01/05/2022    Improve FOTO to 73 Baseline: 40 Goal status: INITIAL   2.  Avram will report L knee pain consistently 0-3/10 on the NPRS Baseline: 5-8/10 Goal status: INITIAL   3.  Improve L knee AROM to 5 - 110 degrees Baseline: 20 - 77 degrees Goal status: INITIAL   4.  Improve L quadriceps strength as assessed by MMT and FOTO scores Baseline: Deferred due to sciatic pain at evaluation Goal status: INITIAL   5.  Latravious will be able to walk without an assistive device at DC Baseline: Walker full-time Goal status: INITIAL   6.  Peretz will be independent with his HEP at DC Baseline: Started 11/10/2021 Goal status: INITIAL     PLAN: PT FREQUENCY:  2-3X/week   PT DURATION: 8 weeks   PLANNED INTERVENTIONS: Therapeutic exercises, Therapeutic activity, Neuromuscular re-education, Balance training, Gait training, Patient/Family education, Self Care, Joint mobilization, Stair training, Cryotherapy, Vasopneumatic device, and Manual therapy   PLAN FOR NEXT SESSION:  what did MD say?   Debbe Odea, PT, DPT 11/16/2021, 2:02 PM

## 2021-11-16 NOTE — Progress Notes (Signed)
This is the first postoperative visit for Zachary [name].  He is a 66 year old gentleman who is 2 weeks status post a left total knee arthroplasty.  He is actually been in outpatient physical therapy now and did not have home therapy.  However this has been better for him because his range of motion is certainly better than what a lot of people are at his first visit.  I am able to flex him to about 95 degrees today.  His extension lacks full extension by about 5 degrees.  He had a significant flexion contracture before surgery.  His calf is soft.  He is able to bend his foot up and down.  His incision looks good on his left knee and the staples are removed and Steri-Strips applied.  He will continue outpatient physical therapy.  I will see him back in 4 weeks to see how he is doing overall but no x-rays are needed.  I will refill his hydrocodone.

## 2021-11-21 ENCOUNTER — Ambulatory Visit (INDEPENDENT_AMBULATORY_CARE_PROVIDER_SITE_OTHER): Payer: BC Managed Care – PPO | Admitting: Physical Therapy

## 2021-11-21 ENCOUNTER — Encounter: Payer: Self-pay | Admitting: Physical Therapy

## 2021-11-21 DIAGNOSIS — M6281 Muscle weakness (generalized): Secondary | ICD-10-CM | POA: Diagnosis not present

## 2021-11-21 DIAGNOSIS — R262 Difficulty in walking, not elsewhere classified: Secondary | ICD-10-CM | POA: Diagnosis not present

## 2021-11-21 DIAGNOSIS — M25662 Stiffness of left knee, not elsewhere classified: Secondary | ICD-10-CM

## 2021-11-21 DIAGNOSIS — R600 Localized edema: Secondary | ICD-10-CM

## 2021-11-21 DIAGNOSIS — M25562 Pain in left knee: Secondary | ICD-10-CM

## 2021-11-21 NOTE — Therapy (Signed)
OUTPATIENT PHYSICAL THERAPY TREATMENT NOTE   Patient Name: Zachary [name]. MRN: [id number] DOB:02-15-1956, 66 y.o., male 54 Date: 11/21/2021  PCP: Wendie Agreste, MD REFERRING PROVIDER: Mcarthur Rossetti, MD  END OF SESSION:   PT End of Session - 11/21/21 1134     Visit Number 5    Number of Visits 18    Date for PT Re-Evaluation 01/05/22    Progress Note Due on Visit 10    PT Start Time 1135    PT Stop Time 1231    PT Time Calculation (min) 56 min    Activity Tolerance Patient tolerated treatment well    Behavior During Therapy WFL for tasks assessed/performed               Past Medical History:  Diagnosis Date   Allergy    Arthritis    Asthma    Blood transfusion without reported diagnosis    as child   Constipation    Heart murmur    mild   Hyperlipidemia    slight elevation   Hypertension    Past Surgical History:  Procedure Laterality Date   APPENDECTOMY     age 52   COLONOSCOPY  2008   KNEE SURGERY Left 1982   pilonidal abcess     x 2 surgeries to correct this   TONSILLECTOMY     as a child   TOTAL KNEE ARTHROPLASTY Left 11/03/2021   Procedure: LEFT TOTAL KNEE ARTHROPLASTY;  Surgeon: Mcarthur Rossetti, MD;  Location: [facility];  Service: Orthopedics;  Laterality: Left;   Patient Active Problem List   Diagnosis Date Noted   Status post total left knee replacement 11/03/2021   Unilateral primary osteoarthritis, left knee 11/02/2021   OBESITY 05/09/2009   Essential hypertension 05/09/2009   HEMOCCULT POSITIVE STOOL 05/09/2009   HEART MURMUR, HX OF 05/09/2009    REFERRING DIAG: Z16.967 (ICD-10-CM) - H/O total knee replacement, left   ONSET DATE: November 03, 2021  THERAPY DIAG:  Difficulty in walking, not elsewhere classified  Muscle weakness (generalized)  Localized edema  Stiffness of left knee, not elsewhere classified  Left knee pain, unspecified chronicity  Rationale for Evaluation and Treatment  Rehabilitation  PERTINENT HISTORY: HTN, obesity, previous L ACL reconstruction  PRECAUTIONS: Be aware of intermittent L sciatica  SUBJECTIVE: doing well knee is just stiff from inflammation   PAIN:  Are you having pain? Yes: NPRS scale:  4-6/10 today Pain location: front around kneecap and medial side Pain description: achy, dull, stiff Aggravating factors: extreme stretch Relieving factors: movement from stiffness, pain meds, ice, elevation  OBJECTIVE: (objective measures completed at initial evaluation unless otherwise dated)   DIAGNOSTIC FINDINGS: 11/03/21  Status post interval left total knee arthroplasty. The hardware is well positioned. There is no evidence of acute fracture or dislocation. Anterior skin staples are present. There is gas in the joint and the anterior soft tissues   PATIENT SURVEYS:  FOTO   11/10/2021  40 (risk adjusted 27) Goal 73  in 16 visits            SENSATION: [name] notes L sciatica with prolonged sitting   EDEMA:  Noted and not measured     LOWER EXTREMITY ROM:   ROM (A: AROM, P: PROM) Right eval Left eval Left 11/13/21 Left 11/15/21 Left 11/16/21 Left 11/21/21  Knee flexion A: 120 A: 77 Seated P: 94* Supine  AA: 90* Supine PROM 97 Supine A: 100 P: 106  Knee extension A: -  12 A: -20 Supine P: -6* Supine AA: -3* Supine AROM -10, PROM -6 Supine A: -7 P: -4   (Blank rows = not tested)   LOWER EXTREMITY Strength:   Deferred due to AROM impairments noted at evaluation Right eval Left eval  Knee flexion   3-/5   Knee extension   3-/5    (Blank rows = not tested)   GAIT: 11/13/2021:  pt amb with RW with LLE stiff leg with knee flexed in stance & minimal increase in flexion for swing. 11/10/2021: Karsin is using a wheeled walker post-TKA   TODAY'S TREATMENT: 11/21/21 Therex: NuStep L6 x 8 min; seat 14 Leg Press bil 75# 2x15; LLE only 31# 2x15 LLE only LAQ 4# 3x10; 3 sec hold AA knee flexion in sitting (RLE providing overpressure) 10 x  10 sec Long sitting quad set in heel prop 20 x 5 sec hold AA heel slides LLE supine x 20 reps  Manual: Lt knee extension PROM and extension mobs gr 1-2  Modalities Vaso x 10 min; mod pressure; 34 deg; Lt knee   10/2021 Therapeutic Exercise: Nu step seat #15 L5 for 8 min  Seated knee flexion stretch AAROM 10 sec X10 LLE LAQ 3 sec hold and controlled eccentric 10 reps 2 sets with 3#weight LLE seated SLR 2X10 Supine heel prop with quad sets 5 sec X15 Leg press DL 75# 2X15, then left leg only 31# 2X10  Manual therapy: PROM for left knee flexion and extension to tolerance with overpressure, extension mobs grade 1-2 to tolerance  Provided his RW with tennis balls to reduce drag in posterior legs  Deferred Vaso at end of session, he will ice at home.  11/15/2021 Therapeutic Activities: Pre-gait with wt shifts: LLE to counter BUE support counter & RW - terminal stance wt shift to LLE, rolling over toe for knee flexion pre-swing stepping over 2" tall X 10" long block for swing flexion, initial contact with heel then wt shift onto LLE,  stepping back over block 20 reps. Progressed to gait with RW. Pt able to return demo with focus & verbal cues.  Pt & wife questioning progression of gait.  PT explained progression RW to cane to no device and progressing distance household to community. Pt & wife verbalized understanding.   Therapeutic Exercise: LLE LAQ with strap for end range assist 5 sec hold without strap assist and controlled eccentric 10 reps 2 sets. LLE seated active knee flexion 5 sec hold 10 reps 2 sets Supine SAQ 3# 10 reps 2 sets Knee flexion with BLEs on 55cm ball with strap assist 5 sec hold flexion 10 reps 2 sets. Standing heel raises BLEs 3 sec hold 10 reps.   Manual therapy: PROM with overpressure for flex & ext. Contract relax antagonist and agonist for flexion  Vaso: Left knee medium compression 34* 10 min with elevation     PATIENT EDUCATION:  Education details:  Reviewed exam findings and HEP on vaso Person educated: Patient and Spouse Education method: Explanation, Demonstration, Tactile cues, Verbal cues, and Handouts Education comprehension: verbalized understanding, returned demonstration, verbal cues required, tactile cues required, and needs further education     HOME EXERCISE PROGRAM: Access Code: [email or web address] URL: [email or web address] Date: 11/13/2021 Prepared by: Jamey Reas  Exercises - Supine Quadricep Sets  - 3-5 x daily - 7 x weekly - 2-3 sets - 10 reps - 5 second hold - Supine Heel Slide with Strap  - 2-3 x daily - 7 x weekly - 2-3 sets -  10 reps - 5 seconds hold - Supine Straight Leg Raises  - 2-3 x daily - 7 x weekly - 2-3 sets - 10 reps - 5 seconds hold - Seated Knee Flexion AAROM  - 3-5 x daily - 7 x weekly - 1 sets - 1 reps - 3 minutes hold - Seated Knee Extension Stretch with Chair  - 1 x daily - 7 x weekly - 1 sets - 1 reps - 3-5 minutes hold - Seated Knee Flexion Extension AROM   - 2-3 x daily - 7 x weekly - 2-3 sets - 10 reps - 5 seconds hold - Seated Quad Set  - 2-3 x daily - 7 x weekly - 2-3 sets - 10 reps - 5 seconds hold - Standing Lumbar Extension at Wall - Forearms  - 5 x daily - 7 x weekly - 1 sets - 5 reps - 3 seconds hold   ASSESSMENT:   CLINICAL IMPRESSION: Pt with continued improvement in ROM today meeting STG #1.  Overall progressing well with PT, and did initiate amb with SPC today which pt demonstrated quick understanding of sequencing and no evidence of imbalance.  Will continue to benefit from PT to maximize function.   OBJECTIVE IMPAIRMENTS Abnormal gait, decreased activity tolerance, decreased balance, decreased endurance, decreased knowledge of condition, difficulty walking, decreased ROM, decreased strength, increased edema, obesity, and pain.    ACTIVITY LIMITATIONS carrying, lifting, bending, sitting, standing, squatting, sleeping, stairs, and locomotion level   PARTICIPATION  LIMITATIONS: driving, community activity, occupation, and church   PERSONAL FACTORS HTN, obesity, previous L ACL reconstruction are also affecting patient's functional outcome.    REHAB POTENTIAL: Good   CLINICAL DECISION MAKING: Stable/uncomplicated   EVALUATION COMPLEXITY: Low     GOALS: Goals reviewed with patient? Yes   SHORT TERM GOALS: Target date: 12/08/2021  Improve L knee AROM to 10 - 100 degrees Baseline: 20 - 77 degrees Goal status: MET 11/21/21   2.  Ilia will be able to walk with a cane outside the home Baseline: Walker full-time Goal status: INITIAL     LONG TERM GOALS: Target date: 01/05/2022    Improve FOTO to 73 Baseline: 40 Goal status: INITIAL   2.  Kaled will report L knee pain consistently 0-3/10 on the NPRS Baseline: 5-8/10 Goal status: INITIAL   3.  Improve L knee AROM to 5 - 110 degrees Baseline: 20 - 77 degrees Goal status: INITIAL   4.  Improve L quadriceps strength as assessed by MMT and FOTO scores Baseline: Deferred due to sciatic pain at evaluation Goal status: INITIAL   5.  Caroll will be able to walk without an assistive device at DC Baseline: Walker full-time Goal status: INITIAL   6.  Almus will be independent with his HEP at DC Baseline: Started 11/10/2021 Goal status: INITIAL     PLAN: PT FREQUENCY:  2-3X/week   PT DURATION: 8 weeks   PLANNED INTERVENTIONS: Therapeutic exercises, Therapeutic activity, Neuromuscular re-education, Balance training, Gait training, Patient/Family education, Self Care, Joint mobilization, Stair training, Cryotherapy, Vasopneumatic device, and Manual therapy   PLAN FOR NEXT SESSION:  continue to maximize ROM, strengthening/balance   Faustino Congress, PT, DPT 11/21/2021, 12:43 PM

## 2021-11-22 ENCOUNTER — Encounter: Payer: BC Managed Care – PPO | Admitting: Physical Therapy

## 2021-11-22 NOTE — Therapy (Signed)
OUTPATIENT PHYSICAL THERAPY TREATMENT NOTE   Patient Name: Zachary [name]. MRN: [id number] DOB:12-Jul-1955, 66 y.o., male 78 Date: 11/23/2021  PCP: Wendie Agreste, MD REFERRING PROVIDER: Mcarthur Rossetti, MD  END OF SESSION:   PT End of Session - 11/23/21 1132     Visit Number 6    Number of Visits 18    Date for PT Re-Evaluation 01/05/22    Progress Note Due on Visit 10    PT Start Time 1140    PT Stop Time 1238    PT Time Calculation (min) 58 min    Activity Tolerance Patient tolerated treatment well    Behavior During Therapy WFL for tasks assessed/performed             Past Medical History:  Diagnosis Date   Allergy    Arthritis    Asthma    Blood transfusion without reported diagnosis    as child   Constipation    Heart murmur    mild   Hyperlipidemia    slight elevation   Hypertension    Past Surgical History:  Procedure Laterality Date   APPENDECTOMY     age 66   COLONOSCOPY  2008   KNEE SURGERY Left 1982   pilonidal abcess     x 2 surgeries to correct this   TONSILLECTOMY     as a child   TOTAL KNEE ARTHROPLASTY Left 11/03/2021   Procedure: LEFT TOTAL KNEE ARTHROPLASTY;  Surgeon: Mcarthur Rossetti, MD;  Location: [facility];  Service: Orthopedics;  Laterality: Left;   Patient Active Problem List   Diagnosis Date Noted   Status post total left knee replacement 11/03/2021   Unilateral primary osteoarthritis, left knee 11/02/2021   OBESITY 05/09/2009   Essential hypertension 05/09/2009   HEMOCCULT POSITIVE STOOL 05/09/2009   HEART MURMUR, HX OF 05/09/2009    REFERRING DIAG: Q33.007 (ICD-10-CM) - H/O total knee replacement, left   ONSET DATE: November 03, 2021  THERAPY DIAG:  Difficulty in walking, not elsewhere classified  Muscle weakness (generalized)  Localized edema  Stiffness of left knee, not elsewhere classified  Left knee pain, unspecified chronicity  Rationale for Evaluation and Treatment  Rehabilitation  PERTINENT HISTORY: HTN, obesity, previous L ACL reconstruction  PRECAUTIONS: Be aware of intermittent L sciatica  SUBJECTIVE: He had a busy day yesterday and was up with work a lot. He still has stiffness and discomfort at night but is sleeping well at night taking melatonin.  PAIN:  Are you having pain? Yes: NPRS scale: 4-5/10 today, ranges 4-7/10 Pain location: front around kneecap and medial side Pain description: achy, dull, stiff Aggravating factors: extreme stretch Relieving factors: movement from stiffness, pain meds, ice, elevation  OBJECTIVE: (objective measures completed at initial evaluation unless otherwise dated)   DIAGNOSTIC FINDINGS: 11/03/21  Status post interval left total knee arthroplasty. The hardware is well positioned. There is no evidence of acute fracture or dislocation. Anterior skin staples are present. There is gas in the joint and the anterior soft tissues   PATIENT SURVEYS:  FOTO   11/10/2021  40 (risk adjusted 27) Goal 73  in 16 visits            SENSATION: [name] notes L sciatica with prolonged sitting   EDEMA:  Noted and not measured     LOWER EXTREMITY ROM:   ROM (A: AROM, P: PROM) Right eval Left eval Left 11/13/21 Left 11/15/21 Left 11/16/21 Left 11/21/21  Knee flexion A: 120 A: 77  Seated P: 94* Supine  AA: 90* Supine PROM 97 Supine A: 100 P: 106  Knee extension A: -12 A: -20 Supine P: -6* Supine AA: -3* Supine AROM -10, PROM -6 Supine A: -7 P: -4   (Blank rows = not tested)   LOWER EXTREMITY Strength:   Deferred due to AROM impairments noted at evaluation Right eval Left eval  Knee flexion   3-/5   Knee extension   3-/5    (Blank rows = not tested)   GAIT: 11/23/2021: Pt ambulates with cane x230' including ramp and curb with no losses of balance or knee instability 11/13/2021:  pt amb with RW with LLE stiff leg with knee flexed in stance & minimal increase in flexion for swing. 11/10/2021: Zachary [name] is using a  wheeled walker post-TKA   TODAY'S TREATMENT: 11/23/21 Therex: NuStep x 8 min; seat 15 - BUE/BLE for 4 min x level 7, BLEs only for 4 min x level 6 Leg Press bil 81# 2x20; LLE only 31# 2x15 with verbal cues for knee extension PT educated on progression back to gym with exercise planning, warm up/cool down, and timing prior to discharge from PT, pt and wife verbalized understanding  TherAct: Pt ambulates with cane x 230' including ramp and curb with no losses of balance or knee instability, advised to use cane at home and short community distances and pt verbalized understanding 11 step staircase Descend LUE rail and ascend RUE rail with cane and step-to RLE stance, no losses of balance or instability - PT advised to use rail and cane with stairs when returning to his home, pt and wife verbalized understanding BUE rail descend LLE stance with 3 steps alternating and ascend alternating, heavy weight bearing on UE for descent  Manual: Lt knee flexion PROM to end range tolerance Lt knee extension PROM and extension mobs gr 1-2  Self-Care: PT education for incision care with not removing steri-strips, hygiene recommendations, and lotion guidelines to prevent infection  Modalities Vaso x 10 min; mod pressure; 34 deg; Lt knee  11/21/21 Therex: NuStep L6 x 8 min; seat 14 Leg Press bil 75# 2x15; LLE only 31# 2x15 LLE only LAQ 4# 3x10; 3 sec hold AA knee flexion in sitting (RLE providing overpressure) 10 x 10 sec Long sitting quad set in heel prop 20 x 5 sec hold AA heel slides LLE supine x 20 reps  Manual: Lt knee extension PROM and extension mobs gr 1-2  Modalities Vaso x 10 min; mod pressure; 34 deg; Lt knee   10/2021 Therapeutic Exercise: Nu step seat #15 L5 for 8 min  Seated knee flexion stretch AAROM 10 sec X10 LLE LAQ 3 sec hold and controlled eccentric 10 reps 2 sets with 3#weight LLE seated SLR 2X10 Supine heel prop with quad sets 5 sec X15 Leg press DL 75# 2X15, then left  leg only 31# 2X10  Manual therapy: PROM for left knee flexion and extension to tolerance with overpressure, extension mobs grade 1-2 to tolerance  Provided his RW with tennis balls to reduce drag in posterior legs  Deferred Vaso at end of session, he will ice at home.     PATIENT EDUCATION:  Education details: Reviewed exam findings and HEP on vaso Person educated: Patient and Spouse Education method: Explanation, Demonstration, Tactile cues, Verbal cues, and Handouts Education comprehension: verbalized understanding, returned demonstration, verbal cues required, tactile cues required, and needs further education     HOME EXERCISE PROGRAM: Access Code: [email or web address] URL: [email or web address] Date: 11/13/2021 Prepared  by: Jamey Reas  Exercises - Supine Quadricep Sets  - 3-5 x daily - 7 x weekly - 2-3 sets - 10 reps - 5 second hold - Supine Heel Slide with Strap  - 2-3 x daily - 7 x weekly - 2-3 sets - 10 reps - 5 seconds hold - Supine Straight Leg Raises  - 2-3 x daily - 7 x weekly - 2-3 sets - 10 reps - 5 seconds hold - Seated Knee Flexion AAROM  - 3-5 x daily - 7 x weekly - 1 sets - 1 reps - 3 minutes hold - Seated Knee Extension Stretch with Chair  - 1 x daily - 7 x weekly - 1 sets - 1 reps - 3-5 minutes hold - Seated Knee Flexion Extension AROM   - 2-3 x daily - 7 x weekly - 2-3 sets - 10 reps - 5 seconds hold - Seated Quad Set  - 2-3 x daily - 7 x weekly - 2-3 sets - 10 reps - 5 seconds hold - Standing Lumbar Extension at Wall - Forearms  - 5 x daily - 7 x weekly - 1 sets - 5 reps - 3 seconds hold   ASSESSMENT:   CLINICAL IMPRESSION: In today's session, he ambulated 230 feet including the ramp and curb with a single point cane with no losses of balance or instability, so he was advised to use it in the home and with short distances in the community when not limited by morning back pain. Stair negotiation with a cane and single rail with the step-to pattern was  safe and he was informed that he is safe to navigate the stairs in his home when he returns from staying with his daughter. Stair navigation with LLE descent or alternating pattern currently requires BUE rail support for safety and control with strength limitations. He is looking to return to workouts at the gym and was educated about timeframe for return to gym and general guidelines for exercise planning. He will continue to benefit from skilled PT to address range, strength, and mobility deficits.   OBJECTIVE IMPAIRMENTS Abnormal gait, decreased activity tolerance, decreased balance, decreased endurance, decreased knowledge of condition, difficulty walking, decreased ROM, decreased strength, increased edema, obesity, and pain.    ACTIVITY LIMITATIONS carrying, lifting, bending, sitting, standing, squatting, sleeping, stairs, and locomotion level   PARTICIPATION LIMITATIONS: driving, community activity, occupation, and church   PERSONAL FACTORS HTN, obesity, previous L ACL reconstruction are also affecting patient's functional outcome.    REHAB POTENTIAL: Good   CLINICAL DECISION MAKING: Stable/uncomplicated   EVALUATION COMPLEXITY: Low     GOALS: Goals reviewed with patient? Yes   SHORT TERM GOALS: Target date: 12/08/2021  Improve L knee AROM to 10 - 100 degrees Baseline: 20 - 77 degrees Goal status: MET 11/21/21   2.  Tevis will be able to walk with a cane outside the home Baseline: Walker full-time Goal status: INITIAL     LONG TERM GOALS: Target date: 01/05/2022    Improve FOTO to 73 Baseline: 40 Goal status: INITIAL   2.  Nikola will report L knee pain consistently 0-3/10 on the NPRS Baseline: 5-8/10 Goal status: INITIAL   3.  Improve L knee AROM to 5 - 110 degrees Baseline: 20 - 77 degrees Goal status: INITIAL   4.  Improve L quadriceps strength as assessed by MMT and FOTO scores Baseline: Deferred due to sciatic pain at evaluation Goal status: INITIAL   5.   Ilian will be able  to walk without an assistive device at DC Baseline: Walker full-time Goal status: INITIAL   6.  Ibrahim will be independent with his HEP at DC Baseline: Started 11/10/2021 Goal status: INITIAL     PLAN: PT FREQUENCY:  2-3X/week   PT DURATION: 8 weeks   PLANNED INTERVENTIONS: Therapeutic exercises, Therapeutic activity, Neuromuscular re-education, Balance training, Gait training, Patient/Family education, Self Care, Joint mobilization, Stair training, Cryotherapy, Vasopneumatic device, and Manual therapy   PLAN FOR NEXT SESSION:  ask about cane use and assess height of personal cane if brought in, address height of rowing machine at home and ability to get on and off machine, continue to maximize active and passive ROM with exercise, therapeutic activites & manual therapy, strengthening/balance  Jana Hakim, Student-PT 11/23/2021, 12:55 PM   This entire session of physical therapy was performed under the direct supervision of PT signing evaluation /treatment. PT reviewed note and agrees.  Jamey Reas, PT, DPT 11/23/21, 2:00 PM

## 2021-11-23 ENCOUNTER — Ambulatory Visit (INDEPENDENT_AMBULATORY_CARE_PROVIDER_SITE_OTHER): Payer: BC Managed Care – PPO | Admitting: Physical Therapy

## 2021-11-23 DIAGNOSIS — R600 Localized edema: Secondary | ICD-10-CM | POA: Diagnosis not present

## 2021-11-23 DIAGNOSIS — M25562 Pain in left knee: Secondary | ICD-10-CM

## 2021-11-23 DIAGNOSIS — M25662 Stiffness of left knee, not elsewhere classified: Secondary | ICD-10-CM | POA: Diagnosis not present

## 2021-11-23 DIAGNOSIS — M6281 Muscle weakness (generalized): Secondary | ICD-10-CM

## 2021-11-23 DIAGNOSIS — R262 Difficulty in walking, not elsewhere classified: Secondary | ICD-10-CM

## 2021-11-28 ENCOUNTER — Encounter: Payer: Self-pay | Admitting: Physical Therapy

## 2021-11-28 ENCOUNTER — Ambulatory Visit (INDEPENDENT_AMBULATORY_CARE_PROVIDER_SITE_OTHER): Payer: BC Managed Care – PPO | Admitting: Physical Therapy

## 2021-11-28 DIAGNOSIS — M25562 Pain in left knee: Secondary | ICD-10-CM

## 2021-11-28 DIAGNOSIS — R262 Difficulty in walking, not elsewhere classified: Secondary | ICD-10-CM

## 2021-11-28 DIAGNOSIS — M6281 Muscle weakness (generalized): Secondary | ICD-10-CM | POA: Diagnosis not present

## 2021-11-28 DIAGNOSIS — R600 Localized edema: Secondary | ICD-10-CM | POA: Diagnosis not present

## 2021-11-28 DIAGNOSIS — M25662 Stiffness of left knee, not elsewhere classified: Secondary | ICD-10-CM | POA: Diagnosis not present

## 2021-11-28 NOTE — Therapy (Signed)
OUTPATIENT PHYSICAL THERAPY TREATMENT NOTE   Patient Name: Zachary [name]. MRN: [id number] DOB:Sep 13, 1955, 66 y.o., male Today's Date: 11/28/2021  PCP: Wendie Agreste, MD REFERRING PROVIDER: Mcarthur Rossetti, MD  END OF SESSION:   PT End of Session - 11/28/21 1432     Visit Number 7    Number of Visits 18    Date for PT Re-Evaluation 01/05/22    Progress Note Due on Visit 10    PT Start Time 1430    PT Stop Time 1525    PT Time Calculation (min) 55 min    Activity Tolerance Patient tolerated treatment well    Behavior During Therapy WFL for tasks assessed/performed              Past Medical History:  Diagnosis Date   Allergy    Arthritis    Asthma    Blood transfusion without reported diagnosis    as child   Constipation    Heart murmur    mild   Hyperlipidemia    slight elevation   Hypertension    Past Surgical History:  Procedure Laterality Date   APPENDECTOMY     age 69   COLONOSCOPY  2008   KNEE SURGERY Left 1982   pilonidal abcess     x 2 surgeries to correct this   TONSILLECTOMY     as a child   TOTAL KNEE ARTHROPLASTY Left 11/03/2021   Procedure: LEFT TOTAL KNEE ARTHROPLASTY;  Surgeon: Mcarthur Rossetti, MD;  Location: [facility];  Service: Orthopedics;  Laterality: Left;   Patient Active Problem List   Diagnosis Date Noted   Status post total left knee replacement 11/03/2021   Unilateral primary osteoarthritis, left knee 11/02/2021   OBESITY 05/09/2009   Essential hypertension 05/09/2009   HEMOCCULT POSITIVE STOOL 05/09/2009   HEART MURMUR, HX OF 05/09/2009    REFERRING DIAG: N46.270 (ICD-10-CM) - H/O total knee replacement, left   ONSET DATE: November 03, 2021  THERAPY DIAG:  Difficulty in walking, not elsewhere classified  Muscle weakness (generalized)  Localized edema  Stiffness of left knee, not elsewhere classified  Left knee pain, unspecified chronicity  Rationale for Evaluation and Treatment  Rehabilitation  PERTINENT HISTORY: HTN, obesity, previous L ACL reconstruction  PRECAUTIONS: Be aware of intermittent L sciatica  SUBJECTIVE:  He has been walking with cane in/out office & home and short distances without device. He is still having trouble getting comfortable sleeping esp in sidelying either directions.    PAIN:  Are you having pain? Yes: NPRS scale: 4-6/10 today, ranges 3/10 to 7/10 Pain location: front around kneecap and medial side Pain description: achy, dull, stiff Aggravating factors: extreme stretch, laying on sides or touching area Relieving factors: movement from stiffness, pain meds, ice, elevation  OBJECTIVE: (objective measures completed at initial evaluation unless otherwise dated)   DIAGNOSTIC FINDINGS: 11/03/21  Status post interval left total knee arthroplasty. The hardware is well positioned. There is no evidence of acute fracture or dislocation. Anterior skin staples are present. There is gas in the joint and the anterior soft tissues   PATIENT SURVEYS:  FOTO   11/10/2021  40 (risk adjusted 27) Goal 73  in 16 visits            SENSATION: [name] notes L sciatica with prolonged sitting   EDEMA:  Noted and not measured     LOWER EXTREMITY ROM:   ROM (A: AROM, P: PROM) Right eval Left eval Left 11/13/21 Left 11/15/21  Left 11/16/21 Left 11/21/21 Left 11/28/21  Knee flexion A: 120 A: 77 Seated P: 94* Supine  AA: 90* Supine PROM 97 Supine A: 100 P: 106 Seated P: 110*  Knee extension A: -12 A: -20 Supine P: -6* Supine AA: -3* Supine AROM -10, PROM -6 Supine A: -7 P: -4    (Blank rows = not tested)   LOWER EXTREMITY Strength:   Deferred due to AROM impairments noted at evaluation Right eval Left eval  Knee flexion   3-/5   Knee extension   3-/5    (Blank rows = not tested)   GAIT: 11/23/2021: Pt ambulates with cane x230' including ramp and curb with no losses of balance or knee instability 11/13/2021:  pt amb with RW with LLE stiff leg  with knee flexed in stance & minimal increase in flexion for swing. 11/10/2021: Matthias is using a wheeled walker post-TKA   TODAY'S TREATMENT: 11/28/2021 Therex: SciFit recumbent bike seat 14 full revolutions level 1 for 8 min Leg Press bil 106# 20reps 1 set; LLE only 56# 2 sets x15reps with verbal cues for knee extension Gastroc stretch on step heel depression 30 sec 2 reps   TherAct: Pt amb 50' X 3 without device safely but with right knee flexed in stance & decreased stance duration. PT recommended walking inside home or office without device. If he has increased pain or increased limb, then sit to rest or use cane. Pt verbalized understanding.  LLE Step up on BOSU round side up, SLS with RLE adducting in front & behind, and step down 10 reps with BUE support.  PT demo & verbal cues on technique  Neuromuscular Re-ed: Tandem stance on foam beam 30 sec LLE in front & in back. Tandem stance on foam beam tossing 2# ball 10 reps with LLE in front & 10 reps in back with min guard / tactile cues.   Manual: Lt knee flexion PROM to end range tolerance Lt knee extension mobs with belt standing with heel depression   Modalities Vaso x 10 min; mod pressure; 34 deg; Lt knee  11/23/21 Therex: NuStep x 8 min; seat 15 - BUE/BLE for 4 min x level 7, BLEs only for 4 min x level 6 Leg Press bil 81# 2x20; LLE only 31# 2x15 with verbal cues for knee extension PT educated on progression back to gym with exercise planning, warm up/cool down, and timing prior to discharge from PT, pt and wife verbalized understanding  TherAct: Pt ambulates with cane x 230' including ramp and curb with no losses of balance or knee instability, advised to use cane at home and short community distances and pt verbalized understanding 11 step staircase Descend LUE rail and ascend RUE rail with cane and step-to RLE stance, no losses of balance or instability - PT advised to use rail and cane with stairs when returning to his  home, pt and wife verbalized understanding BUE rail descend LLE stance with 3 steps alternating and ascend alternating, heavy weight bearing on UE for descent  Manual: Lt knee flexion PROM to end range tolerance Lt knee extension PROM and extension mobs gr 1-2  Self-Care: PT education for incision care with not removing steri-strips, hygiene recommendations, and lotion guidelines to prevent infection  Modalities Vaso x 10 min; mod pressure; 34 deg; Lt knee  11/21/21 Therex: NuStep L6 x 8 min; seat 14 Leg Press bil 75# 2x15; LLE only 31# 2x15 LLE only LAQ 4# 3x10; 3 sec hold AA knee flexion in  sitting (RLE providing overpressure) 10 x 10 sec Long sitting quad set in heel prop 20 x 5 sec hold AA heel slides LLE supine x 20 reps  Manual: Lt knee extension PROM and extension mobs gr 1-2  Modalities Vaso x 10 min; mod pressure; 34 deg; Lt knee    PATIENT EDUCATION:  Education details: Reviewed exam findings and HEP on vaso Person educated: Patient and Spouse Education method: Explanation, Demonstration, Tactile cues, Verbal cues, and Handouts Education comprehension: verbalized understanding, returned demonstration, verbal cues required, tactile cues required, and needs further education     HOME EXERCISE PROGRAM: Access Code: [email or web address] URL: [email or web address] Date: 11/13/2021 Prepared by: Jamey Reas  Exercises - Supine Quadricep Sets  - 3-5 x daily - 7 x weekly - 2-3 sets - 10 reps - 5 second hold - Supine Heel Slide with Strap  - 2-3 x daily - 7 x weekly - 2-3 sets - 10 reps - 5 seconds hold - Supine Straight Leg Raises  - 2-3 x daily - 7 x weekly - 2-3 sets - 10 reps - 5 seconds hold - Seated Knee Flexion AAROM  - 3-5 x daily - 7 x weekly - 1 sets - 1 reps - 3 minutes hold - Seated Knee Extension Stretch with Chair  - 1 x daily - 7 x weekly - 1 sets - 1 reps - 3-5 minutes hold - Seated Knee Flexion Extension AROM   - 2-3 x daily - 7 x weekly - 2-3 sets  - 10 reps - 5 seconds hold - Seated Quad Set  - 2-3 x daily - 7 x weekly - 2-3 sets - 10 reps - 5 seconds hold - Standing Lumbar Extension at Wall - Forearms  - 5 x daily - 7 x weekly - 1 sets - 5 reps - 3 seconds hold   ASSESSMENT:   CLINICAL IMPRESSION: PT progressed his gait for short distances without cane and he appears safe.  He improved active functional range with PT activities today.  Pt continues to benefit from skilled PT.    OBJECTIVE IMPAIRMENTS Abnormal gait, decreased activity tolerance, decreased balance, decreased endurance, decreased knowledge of condition, difficulty walking, decreased ROM, decreased strength, increased edema, obesity, and pain.    ACTIVITY LIMITATIONS carrying, lifting, bending, sitting, standing, squatting, sleeping, stairs, and locomotion level   PARTICIPATION LIMITATIONS: driving, community activity, occupation, and church   PERSONAL FACTORS HTN, obesity, previous L ACL reconstruction are also affecting patient's functional outcome.    REHAB POTENTIAL: Good   CLINICAL DECISION MAKING: Stable/uncomplicated   EVALUATION COMPLEXITY: Low     GOALS: Goals reviewed with patient? Yes   SHORT TERM GOALS: Target date: 12/08/2021  Improve L knee AROM to 10 - 100 degrees Baseline: 20 - 77 degrees Goal status: MET 11/21/21   2.  Latrell will be able to walk with a cane outside the home Baseline: Walker full-time Goal status: INITIAL     LONG TERM GOALS: Target date: 01/05/2022    Improve FOTO to 73 Baseline: 40 Goal status: INITIAL   2.  Marquett will report L knee pain consistently 0-3/10 on the NPRS Baseline: 5-8/10 Goal status: INITIAL   3.  Improve L knee AROM to 5 - 110 degrees Baseline: 20 - 77 degrees Goal status: INITIAL   4.  Improve L quadriceps strength as assessed by MMT and FOTO scores Baseline: Deferred due to sciatic pain at evaluation Goal status: INITIAL   5.  Colm will be able to walk  without an assistive device at  DC Baseline: Walker full-time Goal status: INITIAL   6.  Deundra will be independent with his HEP at DC Baseline: Started 11/10/2021 Goal status: INITIAL     PLAN: PT FREQUENCY:  2-3X/week   PT DURATION: 8 weeks   PLANNED INTERVENTIONS: Therapeutic exercises, Therapeutic activity, Neuromuscular re-education, Balance training, Gait training, Patient/Family education, Self Care, Joint mobilization, Stair training, Cryotherapy, Vasopneumatic device, and Manual therapy   PLAN FOR NEXT SESSION:   address height of rowing machine at home and ability to get on and off machine, continue to maximize active and passive ROM with exercise, therapeutic activites & manual therapy, strengthening/balance  Jamey Reas, PT, DPT 11/28/2021, 4:48 PM

## 2021-11-29 ENCOUNTER — Ambulatory Visit (INDEPENDENT_AMBULATORY_CARE_PROVIDER_SITE_OTHER): Payer: BC Managed Care – PPO | Admitting: Physical Therapy

## 2021-11-29 ENCOUNTER — Encounter: Payer: Self-pay | Admitting: Physical Therapy

## 2021-11-29 DIAGNOSIS — R262 Difficulty in walking, not elsewhere classified: Secondary | ICD-10-CM

## 2021-11-29 DIAGNOSIS — M6281 Muscle weakness (generalized): Secondary | ICD-10-CM

## 2021-11-29 DIAGNOSIS — M25662 Stiffness of left knee, not elsewhere classified: Secondary | ICD-10-CM

## 2021-11-29 DIAGNOSIS — R600 Localized edema: Secondary | ICD-10-CM

## 2021-11-29 DIAGNOSIS — M25562 Pain in left knee: Secondary | ICD-10-CM

## 2021-11-29 NOTE — Therapy (Signed)
OUTPATIENT PHYSICAL THERAPY TREATMENT NOTE   Patient Name: Zachary [name]. MRN: [id number] DOB:03-Jul-1955, 66 y.o., male 37 Date: 11/29/2021  PCP: Wendie Agreste, MD REFERRING PROVIDER: Mcarthur Rossetti, MD  END OF SESSION:   PT End of Session - 11/29/21 1424     Visit Number 8    Number of Visits 18    Date for PT Re-Evaluation 01/05/22    Progress Note Due on Visit 10    PT Start Time 1426    PT Stop Time 1514    PT Time Calculation (min) 48 min    Activity Tolerance Patient tolerated treatment well    Behavior During Therapy WFL for tasks assessed/performed             Past Medical History:  Diagnosis Date   Allergy    Arthritis    Asthma    Blood transfusion without reported diagnosis    as child   Constipation    Heart murmur    mild   Hyperlipidemia    slight elevation   Hypertension    Past Surgical History:  Procedure Laterality Date   APPENDECTOMY     age 64   COLONOSCOPY  2008   KNEE SURGERY Left 1982   pilonidal abcess     x 2 surgeries to correct this   TONSILLECTOMY     as a child   TOTAL KNEE ARTHROPLASTY Left 11/03/2021   Procedure: LEFT TOTAL KNEE ARTHROPLASTY;  Surgeon: Mcarthur Rossetti, MD;  Location: [facility];  Service: Orthopedics;  Laterality: Left;   Patient Active Problem List   Diagnosis Date Noted   Status post total left knee replacement 11/03/2021   Unilateral primary osteoarthritis, left knee 11/02/2021   OBESITY 05/09/2009   Essential hypertension 05/09/2009   HEMOCCULT POSITIVE STOOL 05/09/2009   HEART MURMUR, HX OF 05/09/2009    REFERRING DIAG: A26.333 (ICD-10-CM) - H/O total knee replacement, left   ONSET DATE: November 03, 2021  THERAPY DIAG:  Difficulty in walking, not elsewhere classified  Muscle weakness (generalized)  Localized edema  Stiffness of left knee, not elsewhere classified  Left knee pain, unspecified chronicity  Rationale for Evaluation and Treatment  Rehabilitation  PERTINENT HISTORY: HTN, obesity, previous L ACL reconstruction  PRECAUTIONS: Be aware of intermittent L sciatica  SUBJECTIVE:  He had increased stiffness since yesterday's appointment and has been sitting a bit more today. He notes burning pain around incision that gets better with movement, and no application of lotion or creams over the incision.  PAIN:  Are you having pain? Yes: NPRS scale: 7/10 today, ranges 4/10 to 7/10 Pain location: front around kneecap and medial side Pain description: achy, dull, stiff Aggravating factors: extreme stretch, laying on sides or touching area Relieving factors: movement from stiffness, pain meds, ice, elevation  OBJECTIVE: (objective measures completed at initial evaluation unless otherwise dated)   DIAGNOSTIC FINDINGS: 11/03/21  Status post interval left total knee arthroplasty. The hardware is well positioned. There is no evidence of acute fracture or dislocation. Anterior skin staples are present. There is gas in the joint and the anterior soft tissues   PATIENT SURVEYS:  FOTO   11/10/2021  40 (risk adjusted 27) Goal 73  in 16 visits            SENSATION: [name] notes L sciatica with prolonged sitting   EDEMA:  Noted and not measured     LOWER EXTREMITY ROM:   ROM (A: AROM, P: PROM) Right eval Left  eval Left 11/13/21 Left 11/15/21 Left 11/16/21 Left 11/21/21 Left 11/28/21  Knee flexion A: 120 A: 77 Seated P: 94* Supine  AA: 90* Supine PROM 97 Supine A: 100 P: 106 Seated P: 110*  Knee extension A: -12 A: -20 Supine P: -6* Supine AA: -3* Supine AROM -10, PROM -6 Supine A: -7 P: -4    (Blank rows = not tested)   LOWER EXTREMITY Strength:   Deferred due to AROM impairments noted at evaluation Right eval Left eval  Knee flexion   3-/5   Knee extension   3-/5    (Blank rows = not tested)   GAIT: 11/23/2021: Pt ambulates with cane x230' including ramp and curb with no losses of balance or knee  instability 11/13/2021:  pt amb with RW with LLE stiff leg with knee flexed in stance & minimal increase in flexion for swing. 11/10/2021: Omari is using a wheeled walker post-TKA   TODAY'S TREATMENT: 11/29/2021 Therex: SciFit recumbent bike seat 14 full revolutions level 1 for 8 min Leg extension machine BLE 20 lbs x 15 reps, BLE concentric LLE eccentric 10 lbs x 10 reps Leg curl machine BLE 35 lbs x 10 reps, LLE 15 lbs x 15 reps PT educated on exercise pacing at gym, warm up/col down, leg machine use and weight suggestions, pt verbalized understanding PT advised to measure seat height and take picture of rowing machine at home to practice getting on and off, pt verbalized understanding  TherAct: Pt amb 50' X 3 without device including ramp and curb. PT recommended walking inside home and community without device. If he has increased pain or weakness, then sit to rest or use cane. Pt and wife verbalized understanding.   Neuromuscular re-ed: Tandem walking in //bars 8' x 6 - x2 with heavy UE use, x 4 with occasional fingertip touch Side stepping in //bars 8' x 6 with occasional fingertip touch, cueing for large steps and knee extension Retrowalking in //bars 8' x 4 with mod BUE support, cueing for knee extension in stance Foam beam sideways stance with A-P weight shifting x 1 min Foam beam sideways stance static with tossing 2# ball 10 reps, pt recovered losses of balance with UE support Foam beam tandem stance tossing 2# ball x 10 reps bil., pt recovered losses of balance with UE support or leaning on bar  11/28/2021 Therex: SciFit recumbent bike seat 14 full revolutions level 1 for 8 min Leg Press bil 106# 20reps 1 set; LLE only 56# 2 sets x15reps with verbal cues for knee extension Gastroc stretch on step heel depression 30 sec 2 reps   TherAct: Pt amb 50' X 3 without device safely but with right knee flexed in stance & decreased stance duration. PT recommended walking inside home or  office without device. If he has increased pain or increased limb, then sit to rest or use cane. Pt verbalized understanding.  LLE Step up on BOSU round side up, SLS with RLE adducting in front & behind, and step down 10 reps with BUE support.  PT demo & verbal cues on technique  Neuromuscular Re-ed: Tandem stance on foam beam 30 sec LLE in front & in back. Tandem stance on foam beam tossing 2# ball 10 reps with LLE in front & 10 reps in back with min guard / tactile cues.   Manual: Lt knee flexion PROM to end range tolerance Lt knee extension mobs with belt standing with heel depression   Modalities Vaso x 10 min; mod  pressure; 34 deg; Lt knee  11/23/21 Therex: NuStep x 8 min; seat 15 - BUE/BLE for 4 min x level 7, BLEs only for 4 min x level 6 Leg Press bil 81# 2x20; LLE only 31# 2x15 with verbal cues for knee extension PT educated on progression back to gym with exercise planning, warm up/cool down, and timing prior to discharge from PT, pt and wife verbalized understanding  TherAct: Pt ambulates with cane x 230' including ramp and curb with no losses of balance or knee instability, advised to use cane at home and short community distances and pt verbalized understanding 11 step staircase Descend LUE rail and ascend RUE rail with cane and step-to RLE stance, no losses of balance or instability - PT advised to use rail and cane with stairs when returning to his home, pt and wife verbalized understanding BUE rail descend LLE stance with 3 steps alternating and ascend alternating, heavy weight bearing on UE for descent  Manual: Lt knee flexion PROM to end range tolerance Lt knee extension PROM and extension mobs gr 1-2  Self-Care: PT education for incision care with not removing steri-strips, hygiene recommendations, and lotion guidelines to prevent infection  Modalities Vaso x 10 min; mod pressure; 34 deg; Lt knee    PATIENT EDUCATION:  Education details: Reviewed exam findings  and HEP on vaso Person educated: Patient and Spouse Education method: Explanation, Demonstration, Tactile cues, Verbal cues, and Handouts Education comprehension: verbalized understanding, returned demonstration, verbal cues required, tactile cues required, and needs further education     HOME EXERCISE PROGRAM: Access Code: [email or web address] URL: [email or web address] Date: 11/13/2021 Prepared by: Jamey Reas  Exercises - Supine Quadricep Sets  - 3-5 x daily - 7 x weekly - 2-3 sets - 10 reps - 5 second hold - Supine Heel Slide with Strap  - 2-3 x daily - 7 x weekly - 2-3 sets - 10 reps - 5 seconds hold - Supine Straight Leg Raises  - 2-3 x daily - 7 x weekly - 2-3 sets - 10 reps - 5 seconds hold - Seated Knee Flexion AAROM  - 3-5 x daily - 7 x weekly - 1 sets - 1 reps - 3 minutes hold - Seated Knee Extension Stretch with Chair  - 1 x daily - 7 x weekly - 1 sets - 1 reps - 3-5 minutes hold - Seated Knee Flexion Extension AROM   - 2-3 x daily - 7 x weekly - 2-3 sets - 10 reps - 5 seconds hold - Seated Quad Set  - 2-3 x daily - 7 x weekly - 2-3 sets - 10 reps - 5 seconds hold - Standing Lumbar Extension at Wall - Forearms  - 5 x daily - 7 x weekly - 1 sets - 5 reps - 3 seconds hold   ASSESSMENT:   CLINICAL IMPRESSION: He has progressed to walking without the cane in his home and community, appearing safe to do so on level ground, ramps, and curbs. He was limited by increased soreness and pain today but began to feel better with movement. He expressed interest in returning to the gym, so strengthening and education today focused on machine use at gym and exercise recommendations. Additionally, he progressed to multi-directional gait and weight shifting balance activities with UE involvement, able to self-recover losses of balance with UE support. He continues to benefit from skilled PT.   OBJECTIVE IMPAIRMENTS Abnormal gait, decreased activity tolerance, decreased balance, decreased  endurance, decreased knowledge of condition, difficulty walking, decreased  ROM, decreased strength, increased edema, obesity, and pain.    ACTIVITY LIMITATIONS carrying, lifting, bending, sitting, standing, squatting, sleeping, stairs, and locomotion level   PARTICIPATION LIMITATIONS: driving, community activity, occupation, and church   PERSONAL FACTORS HTN, obesity, previous L ACL reconstruction are also affecting patient's functional outcome.    REHAB POTENTIAL: Good   CLINICAL DECISION MAKING: Stable/uncomplicated   EVALUATION COMPLEXITY: Low     GOALS: Goals reviewed with patient? Yes   SHORT TERM GOALS: Target date: 12/08/2021  Improve L knee AROM to 10 - 100 degrees Baseline: 20 - 77 degrees Goal status: MET 11/21/21   2.  Laquan will be able to walk with a cane outside the home Baseline: Walker full-time Goal status: INITIAL     LONG TERM GOALS: Target date: 01/05/2022    Improve FOTO to 73 Baseline: 40 Goal status: INITIAL   2.  Arlis will report L knee pain consistently 0-3/10 on the NPRS Baseline: 5-8/10 Goal status: INITIAL   3.  Improve L knee AROM to 5 - 110 degrees Baseline: 20 - 77 degrees Goal status: INITIAL   4.  Improve L quadriceps strength as assessed by MMT and FOTO scores Baseline: Deferred due to sciatic pain at evaluation Goal status: INITIAL   5.  Laquincy will be able to walk without an assistive device at DC Baseline: Walker full-time Goal status: INITIAL   6.  Gianpaolo will be independent with his HEP at DC Baseline: Started 11/10/2021 Goal status: INITIAL     PLAN: PT FREQUENCY:  2-3X/week   PT DURATION: 8 weeks   PLANNED INTERVENTIONS: Therapeutic exercises, Therapeutic activity, Neuromuscular re-education, Balance training, Gait training, Patient/Family education, Self Care, Joint mobilization, Stair training, Cryotherapy, Vasopneumatic device, and Manual therapy   PLAN FOR NEXT SESSION:  address height of rowing machine at home  and ability to get on and off machine, continue to maximize active and passive ROM with exercise, therapeutic activites & manual therapy, progress strengthening/balance with low-height step negotiation  Jana Hakim, Student-PT 11/29/2021, 3:42 PM  This entire session of physical therapy was performed under the direct supervision of PT signing evaluation /treatment. PT reviewed note and agrees.  Jamey Reas, PT, DPT 11/29/2021, 3:42 PM

## 2021-12-01 ENCOUNTER — Ambulatory Visit (INDEPENDENT_AMBULATORY_CARE_PROVIDER_SITE_OTHER): Payer: Medicare Other | Admitting: Rehabilitative and Restorative Service Providers"

## 2021-12-01 ENCOUNTER — Encounter: Payer: Self-pay | Admitting: Rehabilitative and Restorative Service Providers"

## 2021-12-01 DIAGNOSIS — M6281 Muscle weakness (generalized): Secondary | ICD-10-CM | POA: Diagnosis not present

## 2021-12-01 DIAGNOSIS — R262 Difficulty in walking, not elsewhere classified: Secondary | ICD-10-CM | POA: Diagnosis not present

## 2021-12-01 DIAGNOSIS — M25662 Stiffness of left knee, not elsewhere classified: Secondary | ICD-10-CM | POA: Diagnosis not present

## 2021-12-01 DIAGNOSIS — R600 Localized edema: Secondary | ICD-10-CM | POA: Diagnosis not present

## 2021-12-01 DIAGNOSIS — M25562 Pain in left knee: Secondary | ICD-10-CM

## 2021-12-01 NOTE — Therapy (Signed)
OUTPATIENT PHYSICAL THERAPY TREATMENT?PROGRESS NOTE   Patient Name: Zachary [name]. MRN: [id number] DOB:01/09/1956, 66 y.o., male Today's Date: 12/01/2021  PCP: Wendie Agreste, MD REFERRING PROVIDER: Mcarthur Rossetti, MD  END OF SESSION:   PT End of Session - 12/01/21 1348     Visit Number 9    Number of Visits 18    Date for PT Re-Evaluation 01/05/22    Progress Note Due on Visit 10    PT Start Time 1345    PT Stop Time 1426    PT Time Calculation (min) 41 min    Activity Tolerance Patient tolerated treatment well    Behavior During Therapy [facility] for tasks assessed/performed            Progress Note Reporting Period 11/10/2021 to 12/01/2021  See note below for Objective Data and Assessment of Progress/Goals.    Past Medical History:  Diagnosis Date   Allergy    Arthritis    Asthma    Blood transfusion without reported diagnosis    as child   Constipation    Heart murmur    mild   Hyperlipidemia    slight elevation   Hypertension    Past Surgical History:  Procedure Laterality Date   APPENDECTOMY     age 5   COLONOSCOPY  2008   KNEE SURGERY Left 1982   pilonidal abcess     x 2 surgeries to correct this   TONSILLECTOMY     as a child   TOTAL KNEE ARTHROPLASTY Left 11/03/2021   Procedure: LEFT TOTAL KNEE ARTHROPLASTY;  Surgeon: Mcarthur Rossetti, MD;  Location: [facility];  Service: Orthopedics;  Laterality: Left;   Patient Active Problem List   Diagnosis Date Noted   Status post total left knee replacement 11/03/2021   Unilateral primary osteoarthritis, left knee 11/02/2021   OBESITY 05/09/2009   Essential hypertension 05/09/2009   HEMOCCULT POSITIVE STOOL 05/09/2009   HEART MURMUR, HX OF 05/09/2009    REFERRING DIAG: O12.248 (ICD-10-CM) - H/O total knee replacement, left   ONSET DATE: November 03, 2021  THERAPY DIAG:  Difficulty in walking, not elsewhere classified  Muscle weakness (generalized)  Localized edema  Stiffness of left  knee, not elsewhere classified  Left knee pain, unspecified chronicity  Rationale for Evaluation and Treatment Rehabilitation  PERTINENT HISTORY: HTN, obesity, previous L ACL reconstruction  PRECAUTIONS: Be aware of intermittent L sciatica  SUBJECTIVE:  Zachary [name] is using a cane outside the home.  He reports getting 4-6 hours of sleep uninterrupted.  Pain pills as needed, not a regular schedule.  L sciatica has not been as limiting as compared to evaluation.  PAIN:  Are you having pain? Yes: NPRS scale: This week 3-7/10 Pain location: L knee Pain description: Achy, dull, stiff Aggravating factors: Prolonged postures and prolonged WB Relieving factors: Change of position, pain meds, ice, elevation  OBJECTIVE: (objective measures completed at initial evaluation unless otherwise dated)   DIAGNOSTIC FINDINGS: 11/03/21  Status post interval left total knee arthroplasty. The hardware is well positioned. There is no evidence of acute fracture or dislocation. Anterior skin staples are present. There is gas in the joint and the anterior soft tissues   PATIENT SURVEYS:  FOTO 12/01/2021 50 (Goal 73) FOTO   11/10/2021  40 (risk adjusted 27) Goal 73  in 16 visits            SENSATION: Andersen notes improving L sciatica with prolonged sitting   EDEMA:  Noted and not  measured     LOWER EXTREMITY ROM:   ROM (A: AROM, P: PROM) Right eval Left eval Left 11/13/21 Left 11/15/21 Left 11/16/21 Left 11/21/21 Left 11/28/21 Left/RightActive 12/01/2021  Knee flexion A: 120 A: 77 Seated P: 94* Supine  AA: 90* Supine PROM 97 Supine A: 100 P: 106 Seated P: 110* Supine 106/120  Knee extension A: -12 A: -20 Supine P: -6* Supine AA: -3* Supine AROM -10, PROM -6 Supine A: -7 P: -4  Supine -10/-5   (Blank rows = not tested)   LOWER EXTREMITY Strength:   Deferred due to AROM impairments noted at evaluation Right eval Left eval  Knee flexion   3-/5   Knee extension   3-/5    (Blank rows = not tested)    GAIT: 11/23/2021: Pt ambulates with cane x 230' including ramp and curb with no losses of balance or knee instability 11/13/2021:  pt amb with RW with LLE stiff leg with knee flexed in stance & minimal increase in flexion for swing. 11/10/2021: Issiah is using a wheeled walker post-TKA   TODAY'S TREATMENT: 12/01/2021 Recumbent bike Seat 10 for 8 minutes Level 3 Seated knee extension stretch L foot in chair with 7.5# weight hanging from just above the knee (distal thigh) for 3 minutes Tailgate knee flexion 1 minute Quadriceps sets 3 sets of 10 for 5 seconds with L heel prop AAROM knee flexion (R pushes L into flexion) 10X 10 seconds Prone knee extension stretch 2# for 3 minutes (rolled up towels above both knees at the foot of the treatment table)   11/29/2021 Therex: SciFit recumbent bike seat 14 full revolutions level 1 for 8 min Leg extension machine BLE 20 lbs x 15 reps, BLE concentric LLE eccentric 10 lbs x 10 reps Leg curl machine BLE 35 lbs x 10 reps, LLE 15 lbs x 15 reps PT educated on exercise pacing at gym, warm up/col down, leg machine use and weight suggestions, pt verbalized understanding PT advised to measure seat height and take picture of rowing machine at home to practice getting on and off, pt verbalized understanding  TherAct: Pt amb 50' X 3 without device including ramp and curb. PT recommended walking inside home and community without device. If he has increased pain or weakness, then sit to rest or use cane. Pt and wife verbalized understanding.   Neuromuscular re-ed: Tandem walking in //bars 8' x 6 - x2 with heavy UE use, x 4 with occasional fingertip touch Side stepping in //bars 8' x 6 with occasional fingertip touch, cueing for large steps and knee extension Retrowalking in //bars 8' x 4 with mod BUE support, cueing for knee extension in stance Foam beam sideways stance with A-P weight shifting x 1 min Foam beam sideways stance static with tossing 2# ball 10 reps,  pt recovered losses of balance with UE support Foam beam tandem stance tossing 2# ball x 10 reps bil., pt recovered losses of balance with UE support or leaning on bar   11/28/2021 Therex: SciFit recumbent bike seat 14 full revolutions level 1 for 8 min Leg Press bil 106# 20reps 1 set; LLE only 56# 2 sets x15reps with verbal cues for knee extension Gastroc stretch on step heel depression 30 sec 2 reps   TherAct: Pt amb 50' X 3 without device safely but with right knee flexed in stance & decreased stance duration. PT recommended walking inside home or office without device. If he has increased pain or increased limb, then sit to  rest or use cane. Pt verbalized understanding.  LLE Step up on BOSU round side up, SLS with RLE adducting in front & behind, and step down 10 reps with BUE support.  PT demo & verbal cues on technique  Neuromuscular Re-ed: Tandem stance on foam beam 30 sec LLE in front & in back. Tandem stance on foam beam tossing 2# ball 10 reps with LLE in front & 10 reps in back with min guard / tactile cues.   Manual: Lt knee flexion PROM to end range tolerance Lt knee extension mobs with belt standing with heel depression   Modalities Vaso x 10 min; mod pressure; 34 deg; Lt knee    PATIENT EDUCATION:  Education details: Reviewed exam findings and HEP on vaso Person educated: Patient and Spouse Education method: Explanation, Demonstration, Tactile cues, Verbal cues, and Handouts Education comprehension: verbalized understanding, returned demonstration, verbal cues required, tactile cues required, and needs further education     HOME EXERCISE PROGRAM: Access Code: [email or web address] URL: [email or web address] Date: 11/13/2021 Prepared by: Jamey Reas  Exercises - Supine Quadricep Sets  - 3-5 x daily - 7 x weekly - 2-3 sets - 10 reps - 5 second hold - Supine Heel Slide with Strap  - 2-3 x daily - 7 x weekly - 2-3 sets - 10 reps - 5 seconds hold - Supine  Straight Leg Raises  - 2-3 x daily - 7 x weekly - 2-3 sets - 10 reps - 5 seconds hold - Seated Knee Flexion AAROM  - 3-5 x daily - 7 x weekly - 1 sets - 1 reps - 3 minutes hold - Seated Knee Extension Stretch with Chair  - 1 x daily - 7 x weekly - 1 sets - 1 reps - 3-5 minutes hold - Seated Knee Flexion Extension AROM   - 2-3 x daily - 7 x weekly - 2-3 sets - 10 reps - 5 seconds hold - Seated Quad Set  - 2-3 x daily - 7 x weekly - 2-3 sets - 10 reps - 5 seconds hold - Standing Lumbar Extension at [place]  - 5 x daily - 7 x weekly - 1 sets - 5 reps - 3 seconds hold   ASSESSMENT:   CLINICAL IMPRESSION: Raynor is making very good overall objective and functional progress with his post-TKA physical therapy.  He is now lacking 10 degrees of extension (was 20 at evaluation) and this is a 5 degree difference from his uninvolved R knee.  Getting the L knee to at least -5 (R side is also lacking 5 degrees) is a priority with his home and clinic program.  Flexion AROM is good at 106 degrees and strength is improving based on ease with exercise, gait quality and assistive device.  Additional visits will continue his extension AROM and strength emphasis with balance and functional progressions as appropriate.  Consider 2X/week supervised PT.   OBJECTIVE IMPAIRMENTS Abnormal gait, decreased activity tolerance, decreased balance, decreased endurance, decreased knowledge of condition, difficulty walking, decreased ROM, decreased strength, increased edema, obesity, and pain.    ACTIVITY LIMITATIONS carrying, lifting, bending, sitting, standing, squatting, sleeping, stairs, and locomotion level   PARTICIPATION LIMITATIONS: driving, community activity, occupation, and church   PERSONAL FACTORS HTN, obesity, previous L ACL reconstruction are also affecting patient's functional outcome.    REHAB POTENTIAL: Good   CLINICAL DECISION MAKING: Stable/uncomplicated   EVALUATION COMPLEXITY: Low      GOALS: Goals reviewed with patient? Yes   SHORT TERM GOALS:  Target date: 12/08/2021  Improve L knee AROM to 10 - 100 degrees Baseline: 20 - 77 degrees Goal status: MET 11/21/21   2.  Jimmey will be able to walk with a cane outside the home Baseline: Walker full-time Goal status: Met 12/01/2021     LONG TERM GOALS: Target date: 01/05/2022    Improve FOTO to 73 Baseline: 40 Goal status: On Going (50 12/01/2021)   2.  Ransom will report L knee pain consistently 0-3/10 on the NPRS Baseline: 5-8/10 Goal status: On Going 12/01/2021   3.  Improve L knee AROM to 5 - 110 degrees Baseline: 20 - 77 degrees Goal status: On Going (10 - 106 12/01/2021)   4.  Improve L quadriceps strength as assessed by MMT and FOTO scores Baseline: Deferred due to sciatic pain at evaluation Goal status: Improving 12/01/2021   5.  Pruitt will be able to walk without an assistive device at DC Baseline: Walker full-time Goal status: On Going Kasandra Knudsen) 12/01/2021   6.  Sladen will be independent with his HEP at DC Baseline: Started 11/10/2021 Goal status: On Going 12/01/2021     PLAN: PT FREQUENCY:  2-3X/week   PT DURATION: 8 weeks   PLANNED INTERVENTIONS: Therapeutic exercises, Therapeutic activity, Neuromuscular re-education, Balance training, Gait training, Patient/Family education, Self Care, Joint mobilization, Stair training, Cryotherapy, Vasopneumatic device, and Manual therapy   PLAN FOR NEXT SESSION:  Extension AROM and strength, balance and functional progressions as time allows.  Consider 2X/week.  Farley Ly, PT 12/01/2021, 4:41 PM

## 2021-12-04 ENCOUNTER — Encounter: Payer: Self-pay | Admitting: Physical Therapy

## 2021-12-04 ENCOUNTER — Ambulatory Visit (INDEPENDENT_AMBULATORY_CARE_PROVIDER_SITE_OTHER): Payer: BC Managed Care – PPO | Admitting: Physical Therapy

## 2021-12-04 DIAGNOSIS — M6281 Muscle weakness (generalized): Secondary | ICD-10-CM | POA: Diagnosis not present

## 2021-12-04 DIAGNOSIS — R600 Localized edema: Secondary | ICD-10-CM

## 2021-12-04 DIAGNOSIS — R262 Difficulty in walking, not elsewhere classified: Secondary | ICD-10-CM

## 2021-12-04 DIAGNOSIS — M25662 Stiffness of left knee, not elsewhere classified: Secondary | ICD-10-CM

## 2021-12-04 DIAGNOSIS — M25562 Pain in left knee: Secondary | ICD-10-CM

## 2021-12-04 NOTE — Therapy (Signed)
OUTPATIENT PHYSICAL THERAPY TREATMENT   Patient Name: Zachary [name]. MRN: [id number] DOB:Sep 23, 1955, 66 y.o., male Today's Date: 12/04/2021  PCP: Wendie Agreste, MD REFERRING PROVIDER: Mcarthur Rossetti, MD  END OF SESSION:   PT End of Session - 12/04/21 1225     Visit Number 10    Number of Visits 18    Date for PT Re-Evaluation 01/05/22    Progress Note Due on Visit 19    PT Start Time 1145    PT Stop Time 1235    PT Time Calculation (min) 50 min    Activity Tolerance Patient tolerated treatment well    Behavior During Therapy WFL for tasks assessed/performed              Past Medical History:  Diagnosis Date   Allergy    Arthritis    Asthma    Blood transfusion without reported diagnosis    as child   Constipation    Heart murmur    mild   Hyperlipidemia    slight elevation   Hypertension    Past Surgical History:  Procedure Laterality Date   APPENDECTOMY     age 38   COLONOSCOPY  2008   KNEE SURGERY Left 1982   pilonidal abcess     x 2 surgeries to correct this   TONSILLECTOMY     as a child   TOTAL KNEE ARTHROPLASTY Left 11/03/2021   Procedure: LEFT TOTAL KNEE ARTHROPLASTY;  Surgeon: Mcarthur Rossetti, MD;  Location: [facility];  Service: Orthopedics;  Laterality: Left;   Patient Active Problem List   Diagnosis Date Noted   Status post total left knee replacement 11/03/2021   Unilateral primary osteoarthritis, left knee 11/02/2021   OBESITY 05/09/2009   Essential hypertension 05/09/2009   HEMOCCULT POSITIVE STOOL 05/09/2009   HEART MURMUR, HX OF 05/09/2009    REFERRING DIAG: P71.062 (ICD-10-CM) - H/O total knee replacement, left   ONSET DATE: November 03, 2021  THERAPY DIAG:  Difficulty in walking, not elsewhere classified  Muscle weakness (generalized)  Localized edema  Stiffness of left knee, not elsewhere classified  Left knee pain, unspecified chronicity  Rationale for Evaluation and Treatment  Rehabilitation  PERTINENT HISTORY: HTN, obesity, previous L ACL reconstruction  PRECAUTIONS: Be aware of intermittent L sciatica  SUBJECTIVE:  He reports sleeping much better than previously, and is feeling good about his progress so far. He strained his calf this morning stepping over threshold and came in with calf tightness and pain, but no sx in the knee.  PAIN:  Are you having pain? Yes: NPRS scale: 0/10 coming into clinic Pain location: L knee Pain description: Achy, dull, stiff Aggravating factors: Prolonged postures and prolonged WB Relieving factors: Change of position, pain meds, ice, elevation  OBJECTIVE: (objective measures completed at initial evaluation unless otherwise dated)   DIAGNOSTIC FINDINGS: 11/03/21  Status post interval left total knee arthroplasty. The hardware is well positioned. There is no evidence of acute fracture or dislocation. Anterior skin staples are present. There is gas in the joint and the anterior soft tissues   PATIENT SURVEYS:  FOTO 12/01/2021 50 (Goal 73) FOTO   11/10/2021  40 (risk adjusted 27) Goal 73  in 16 visits            SENSATION: [name] notes improving L sciatica with prolonged sitting   EDEMA:  Noted and not measured     LOWER EXTREMITY ROM:   ROM (A: AROM, P: PROM) Right eval Left  eval Left 11/13/21 Left 11/15/21 Left 11/16/21 Left 11/21/21 Left 11/28/21 Right 12/01/21 Left 12/01/21  Knee flexion A: 120 A: 77 Seated P: 94* Supine  AA: 90* Supine PROM 97 Supine A: 100 P: 106 Seated P: 110* Supine  A: 120* Supine  A: 106*  Knee extension A: -12 A: -20 Supine P: -6* Supine AA: -3* Supine AROM -10, PROM -6 Supine A: -7 P: -4  Supine  A: -5* Supine  A: -10*   (Blank rows = not tested)   LOWER EXTREMITY Strength:   Deferred due to AROM impairments noted at evaluation Right eval Left eval  Knee flexion   3-/5   Knee extension   3-/5    (Blank rows = not tested)   GAIT: 11/23/2021: Pt ambulates with cane x 230'  including ramp and curb with no losses of balance or knee instability 11/13/2021:  pt amb with RW with LLE stiff leg with knee flexed in stance & minimal increase in flexion for swing. 11/10/2021: Aquiles is using a wheeled walker post-TKA   TODAY'S TREATMENT: 12/04/2021 TherEx: Gastroc stretch on step 4 x 30s Standing hamstring on step 2 x 30s Standing quad stretch with towel 1 x 30s Standing adductor stretch 2 x 30s Heel/toe rise x 10 with 3 sec hold without UE assist PT demo & verbalize understanding for above exercises.  Pt verbalized understanding for these exercises outside of PT.  TherAct: 6" step LLE up over down x 10 reps, verbal and visual cueing for knee extension and controlled eccentric 6" step lateral LLE stance with BUE support/BUE fingertip support x 10 reps, verbal cueing for controlled eccentric BOSU round side LLE lateral step up and stance with RLE anterior and posterior toe tap then LLE eccentric lower x 10 reps, heavy BUE support and verbal cueing for slow eccentric  Manual: Knee extension grade 3/4 mobs with towel roll heel prop  Vaso x 10 min; mod pressure; 34 deg; Lt knee elevated  12/01/2021 Recumbent bike Seat 10 for 8 minutes Level 3 Seated knee extension stretch L foot in chair with 7.5# weight hanging from just above the knee (distal thigh) for 3 minutes Tailgate knee flexion 1 minute Quadriceps sets 3 sets of 10 for 5 seconds with L heel prop AAROM knee flexion (R pushes L into flexion) 10X 10 seconds Prone knee extension stretch 2# for 3 minutes (rolled up towels above both knees at the foot of the treatment table)   11/29/2021 Therex: SciFit recumbent bike seat 14 full revolutions level 1 for 8 min Leg extension machine BLE 20 lbs x 15 reps, BLE concentric LLE eccentric 10 lbs x 10 reps Leg curl machine BLE 35 lbs x 10 reps, LLE 15 lbs x 15 reps PT educated on exercise pacing at gym, warm up/col down, leg machine use and weight suggestions, pt verbalized  understanding PT advised to measure seat height and take picture of rowing machine at home to practice getting on and off, pt verbalized understanding  TherAct: Pt amb 50' X 3 without device including ramp and curb. PT recommended walking inside home and community without device. If he has increased pain or weakness, then sit to rest or use cane. Pt and wife verbalized understanding.   Neuromuscular re-ed: Tandem walking in //bars 8' x 6 - x2 with heavy UE use, x 4 with occasional fingertip touch Side stepping in //bars 8' x 6 with occasional fingertip touch, cueing for large steps and knee extension Retrowalking in //bars 8' x  4 with mod BUE support, cueing for knee extension in stance Foam beam sideways stance with A-P weight shifting x 1 min Foam beam sideways stance static with tossing 2# ball 10 reps, pt recovered losses of balance with UE support Foam beam tandem stance tossing 2# ball x 10 reps bil., pt recovered losses of balance with UE support or leaning on bar    PATIENT EDUCATION:  Education details: Reviewed exam findings and HEP on vaso Person educated: Patient and Spouse Education method: Explanation, Demonstration, Tactile cues, Verbal cues, and Handouts Education comprehension: verbalized understanding, returned demonstration, verbal cues required, tactile cues required, and needs further education     HOME EXERCISE PROGRAM: Access Code: [email or web address] URL: [email or web address] Date: 11/13/2021 Prepared by: Jamey Reas  Exercises - Supine Quadricep Sets  - 3-5 x daily - 7 x weekly - 2-3 sets - 10 reps - 5 second hold - Supine Heel Slide with Strap  - 2-3 x daily - 7 x weekly - 2-3 sets - 10 reps - 5 seconds hold - Supine Straight Leg Raises  - 2-3 x daily - 7 x weekly - 2-3 sets - 10 reps - 5 seconds hold - Seated Knee Flexion AAROM  - 3-5 x daily - 7 x weekly - 1 sets - 1 reps - 3 minutes hold - Seated Knee Extension Stretch with Chair  - 1 x daily - 7 x  weekly - 1 sets - 1 reps - 3-5 minutes hold - Seated Knee Flexion Extension AROM   - 2-3 x daily - 7 x weekly - 2-3 sets - 10 reps - 5 seconds hold - Seated Quad Set  - 2-3 x daily - 7 x weekly - 2-3 sets - 10 reps - 5 seconds hold - Standing Lumbar Extension at Wall - Forearms  - 5 x daily - 7 x weekly - 1 sets - 5 reps - 3 seconds hold   ASSESSMENT:   CLINICAL IMPRESSION: He will benefit from reinforcing stretching muscle groups across knee, especially with recent onset calf straining. He is continuing to progress with functional LE strengthening. He continues to benefit from skilled PT to address mobility, strength, and gait impairments. This week and future appointments are scheduled 2x/week.   OBJECTIVE IMPAIRMENTS Abnormal gait, decreased activity tolerance, decreased balance, decreased endurance, decreased knowledge of condition, difficulty walking, decreased ROM, decreased strength, increased edema, obesity, and pain.    ACTIVITY LIMITATIONS carrying, lifting, bending, sitting, standing, squatting, sleeping, stairs, and locomotion level   PARTICIPATION LIMITATIONS: driving, community activity, occupation, and church   PERSONAL FACTORS HTN, obesity, previous L ACL reconstruction are also affecting patient's functional outcome.    REHAB POTENTIAL: Good   CLINICAL DECISION MAKING: Stable/uncomplicated   EVALUATION COMPLEXITY: Low     GOALS: Goals reviewed with patient? Yes   SHORT TERM GOALS: Target date: 12/08/2021  Improve L knee AROM to 10 - 100 degrees Baseline: 20 - 77 degrees Goal status: MET 11/21/21   2.  Tavarius will be able to walk with a cane outside the home Baseline: Walker full-time Goal status: Met 12/01/2021     LONG TERM GOALS: Target date: 01/05/2022    Improve FOTO to 73 Baseline: 40 Goal status: On Going (50 12/01/2021)   2.  Raheem will report L knee pain consistently 0-3/10 on the NPRS Baseline: 5-8/10 Goal status: On Going 12/01/2021   3.  Improve L  knee AROM to 5 - 110 degrees Baseline: 20 - 77 degrees Goal status: On Going (  10 - 106 12/01/2021)   4.  Improve L quadriceps strength as assessed by MMT and FOTO scores Baseline: Deferred due to sciatic pain at evaluation Goal status: Improving 12/01/2021   5.  Dashun will be able to walk without an assistive device at DC Baseline: Walker full-time Goal status: On Going Kasandra Knudsen) 12/01/2021   6.  Starr will be independent with his HEP at DC Baseline: Started 11/10/2021 Goal status: On Going 12/01/2021     PLAN: PT FREQUENCY:  2-3X/week   PT DURATION: 8 weeks   PLANNED INTERVENTIONS: Therapeutic exercises, Therapeutic activity, Neuromuscular re-education, Balance training, Gait training, Patient/Family education, Self Care, Joint mobilization, Stair training, Cryotherapy, Vasopneumatic device, and Manual therapy   PLAN FOR NEXT SESSION:  Objective measure - dynamometer testing extension and flexion. Continue to progress LE strengthening, including eccentric quad control. Encourage stretching LE muscle groups, instruct in getting on/off rowing machine if he shows picture.  Jana Hakim, Student-PT 12/04/2021, 12:58 PM  This entire session of physical therapy was performed under the direct supervision of PT signing evaluation /treatment. PT reviewed note and agrees.  Jamey Reas, PT, DPT 12/04/2021, 12:59 PM

## 2021-12-06 ENCOUNTER — Encounter: Payer: Self-pay | Admitting: Rehabilitative and Restorative Service Providers"

## 2021-12-06 ENCOUNTER — Ambulatory Visit (INDEPENDENT_AMBULATORY_CARE_PROVIDER_SITE_OTHER): Payer: BC Managed Care – PPO | Admitting: Rehabilitative and Restorative Service Providers"

## 2021-12-06 DIAGNOSIS — R262 Difficulty in walking, not elsewhere classified: Secondary | ICD-10-CM

## 2021-12-06 DIAGNOSIS — M6281 Muscle weakness (generalized): Secondary | ICD-10-CM | POA: Diagnosis not present

## 2021-12-06 DIAGNOSIS — R600 Localized edema: Secondary | ICD-10-CM | POA: Diagnosis not present

## 2021-12-06 DIAGNOSIS — M25662 Stiffness of left knee, not elsewhere classified: Secondary | ICD-10-CM | POA: Diagnosis not present

## 2021-12-06 DIAGNOSIS — M25562 Pain in left knee: Secondary | ICD-10-CM

## 2021-12-06 NOTE — Therapy (Signed)
OUTPATIENT PHYSICAL THERAPY TREATMENT   Patient Name: Zachary [name]. MRN: [id number] DOB:1955-06-17, 66 y.o., male Today's Date: 12/06/2021  PCP: Wendie Agreste, MD REFERRING PROVIDER: Mcarthur Rossetti, MD  END OF SESSION:   PT End of Session - 12/06/21 1340     Visit Number 11    Number of Visits 18    Date for PT Re-Evaluation 01/05/22    Progress Note Due on Visit 19    PT Start Time 1339    PT Stop Time 1423    PT Time Calculation (min) 44 min    Activity Tolerance Patient tolerated treatment well;No increased pain    Behavior During Therapy WFL for tasks assessed/performed             Past Medical History:  Diagnosis Date   Allergy    Arthritis    Asthma    Blood transfusion without reported diagnosis    as child   Constipation    Heart murmur    mild   Hyperlipidemia    slight elevation   Hypertension    Past Surgical History:  Procedure Laterality Date   APPENDECTOMY     age 15   COLONOSCOPY  2008   KNEE SURGERY Left 1982   pilonidal abcess     x 2 surgeries to correct this   TONSILLECTOMY     as a child   TOTAL KNEE ARTHROPLASTY Left 11/03/2021   Procedure: LEFT TOTAL KNEE ARTHROPLASTY;  Surgeon: Mcarthur Rossetti, MD;  Location: [facility];  Service: Orthopedics;  Laterality: Left;   Patient Active Problem List   Diagnosis Date Noted   Status post total left knee replacement 11/03/2021   Unilateral primary osteoarthritis, left knee 11/02/2021   OBESITY 05/09/2009   Essential hypertension 05/09/2009   HEMOCCULT POSITIVE STOOL 05/09/2009   HEART MURMUR, HX OF 05/09/2009    REFERRING DIAG: G38.756 (ICD-10-CM) - H/O total knee replacement, left   ONSET DATE: November 03, 2021  THERAPY DIAG:  Difficulty in walking, not elsewhere classified  Muscle weakness (generalized)  Localized edema  Stiffness of left knee, not elsewhere classified  Left knee pain, unspecified chronicity  Rationale for Evaluation and Treatment  Rehabilitation  PERTINENT HISTORY: HTN, obesity, previous L ACL reconstruction  PRECAUTIONS: Be aware of intermittent L sciatica  SUBJECTIVE:  Sleep is uninterrupted.  L calf is fine today.  HEP compliance is good with emphasis on extension.  PAIN:  Are you having pain? Yes: NPRS scale: 0-4/10 this week Pain location: L knee Pain description: Achy, dull, stiff Aggravating factors: Prolonged postures and prolonged WB Relieving factors: Change of position, pain meds, ice, elevation  OBJECTIVE: (objective measures completed at initial evaluation unless otherwise dated)   DIAGNOSTIC FINDINGS: 11/03/21  Status post interval left total knee arthroplasty. The hardware is well positioned. There is no evidence of acute fracture or dislocation. Anterior skin staples are present. There is gas in the joint and the anterior soft tissues   PATIENT SURVEYS:  FOTO 12/01/2021 50 (Goal 73) FOTO   11/10/2021  40 (risk adjusted 27) Goal 73  in 16 visits            SENSATION: [name] notes improving L sciatica with prolonged sitting   EDEMA:  Noted and not measured     LOWER EXTREMITY ROM:   ROM (A: AROM, P: PROM) Right eval Left eval Left 11/13/21 Left 11/15/21 Left 11/16/21 Left 11/21/21 Left 11/28/21 Right 12/01/21 Left 12/01/21  Knee flexion A: 120 A:  77 Seated P: 94* Supine  AA: 90* Supine PROM 97 Supine A: 100 P: 106 Seated P: 110* Supine  A: 120* Supine  A: 106*  Knee extension A: -12 A: -20 Supine P: -6* Supine AA: -3* Supine AROM -10, PROM -6 Supine A: -7 P: -4  Supine  A: -5* Supine  A: -10*   (Blank rows = not tested)   LOWER EXTREMITY Strength:   Deferred due to AROM impairments noted at evaluation Right eval Left eval  Knee flexion   3-/5   Knee extension   3-/5    (Blank rows = not tested)   GAIT: 11/23/2021: Pt ambulates with cane x 230' including ramp and curb with no losses of balance or knee instability 11/13/2021:  pt amb with RW with LLE stiff leg with knee flexed  in stance & minimal increase in flexion for swing. 11/10/2021: Dantonio is using a wheeled walker post-TKA   TODAY'S TREATMENT: 12/06/2021 -Recumbent bike Seat 10 for 8 minutes Level 3 -Seated knee extension stretch L foot in chair with 7.5# weight hanging from just above the knee (distal thigh) for 3 minutes -Tailgate knee flexion 1 minute -Quadriceps sets 2 sets of 10 for 5 seconds with L heel prop -AAROM knee flexion (R pushes L into flexion) 10X 10 seconds -Prone knee extension stretch 2# for 3 minutes (rolled up towels above both knees at the foot of the treatment table)  Functional Activities for sit to stand and stairs: -Leg Press L leg only 75# slow eccentrics and push into full extension 2 sets of 15 -Step-up and over with 8 inch step -Step-down off 4 inch step 2 sets of 10X slow eccentrics   12/04/2021 TherEx: Gastroc stretch on step 4 x 30s Standing hamstring on step 2 x 30s Standing quad stretch with towel 1 x 30s Standing adductor stretch 2 x 30s Heel/toe rise x 10 with 3 sec hold without UE assist PT demo & verbalize understanding for above exercises.  Pt verbalized understanding for these exercises outside of PT.  TherAct: 6" step LLE up over down x 10 reps, verbal and visual cueing for knee extension and controlled eccentric 6" step lateral LLE stance with BUE support/BUE fingertip support x 10 reps, verbal cueing for controlled eccentric BOSU round side LLE lateral step up and stance with RLE anterior and posterior toe tap then LLE eccentric lower x 10 reps, heavy BUE support and verbal cueing for slow eccentric  Manual: Knee extension grade 3/4 mobs with towel roll heel prop  Vaso x 10 min; mod pressure; 34 deg; Lt knee elevated   12/01/2021 Recumbent bike Seat 10 for 8 minutes Level 3 Seated knee extension stretch L foot in chair with 7.5# weight hanging from just above the knee (distal thigh) for 3 minutes Tailgate knee flexion 1 minute Quadriceps sets 3 sets of  10 for 5 seconds with L heel prop AAROM knee flexion (R pushes L into flexion) 10X 10 seconds Prone knee extension stretch 2# for 3 minutes (rolled up towels above both knees at the foot of the treatment table)    PATIENT EDUCATION:  Education details: Reviewed exam findings and HEP on vaso Person educated: Patient and Spouse Education method: Explanation, Demonstration, Tactile cues, Verbal cues, and Handouts Education comprehension: verbalized understanding, returned demonstration, verbal cues required, tactile cues required, and needs further education     HOME EXERCISE PROGRAM: Access Code: [email or web address] URL: [email or web address] Date: 11/13/2021 Prepared by: Jamey Reas  Exercises - Supine Quadricep Sets  -  3-5 x daily - 7 x weekly - 2-3 sets - 10 reps - 5 second hold - Supine Heel Slide with Strap  - 2-3 x daily - 7 x weekly - 2-3 sets - 10 reps - 5 seconds hold - Supine Straight Leg Raises  - 2-3 x daily - 7 x weekly - 2-3 sets - 10 reps - 5 seconds hold - Seated Knee Flexion AAROM  - 3-5 x daily - 7 x weekly - 1 sets - 1 reps - 3 minutes hold - Seated Knee Extension Stretch with Chair  - 1 x daily - 7 x weekly - 1 sets - 1 reps - 3-5 minutes hold - Seated Knee Flexion Extension AROM   - 2-3 x daily - 7 x weekly - 2-3 sets - 10 reps - 5 seconds hold - Seated Quad Set  - 2-3 x daily - 7 x weekly - 2-3 sets - 10 reps - 5 seconds hold - Standing Lumbar Extension at Wall - Forearms  - 5 x daily - 7 x weekly - 1 sets - 5 reps - 3 seconds hold   ASSESSMENT:   CLINICAL IMPRESSION: Knee extension AROM, quadriceps strength and functional performance remain high priorities with [facility] home and clinic program.  Objective quadriceps strength assessment was deferred today as typically people of Margues's size put the PT at a high risk of injury or inaccurate measurement with hand-held dynamometer assessment.  Quadriceps weakness is notes with single-leg functional activities  like leg press and steps, demonstrating a need for continued quadriceps strength work along with knee extension AROM.   OBJECTIVE IMPAIRMENTS Abnormal gait, decreased activity tolerance, decreased balance, decreased endurance, decreased knowledge of condition, difficulty walking, decreased ROM, decreased strength, increased edema, obesity, and pain.    ACTIVITY LIMITATIONS carrying, lifting, bending, sitting, standing, squatting, sleeping, stairs, and locomotion level   PARTICIPATION LIMITATIONS: driving, community activity, occupation, and church   PERSONAL FACTORS HTN, obesity, previous L ACL reconstruction are also affecting patient's functional outcome.    REHAB POTENTIAL: Good   CLINICAL DECISION MAKING: Stable/uncomplicated   EVALUATION COMPLEXITY: Low     GOALS: Goals reviewed with patient? Yes   SHORT TERM GOALS: Target date: 12/08/2021  Improve L knee AROM to 10 - 100 degrees Baseline: 20 - 77 degrees Goal status: MET 11/21/21   2.  Kisean will be able to walk with a cane outside the home Baseline: Walker full-time Goal status: Met 12/01/2021     LONG TERM GOALS: Target date: 01/05/2022    Improve FOTO to 73 Baseline: 40 Goal status: On Going (50 12/01/2021)   2.  Ronnel will report L knee pain consistently 0-3/10 on the NPRS Baseline: 5-8/10 Goal status: On Going 12/06/2021   3.  Improve L knee AROM to 5 - 110 degrees Baseline: 20 - 77 degrees Goal status: On Going (10 - 106 12/06/2021)   4.  Improve L quadriceps strength as assessed by MMT and FOTO scores Baseline: Deferred due to sciatic pain at evaluation Goal status: Improving 12/06/2021   5.  Herman will be able to walk without an assistive device at DC Baseline: Walker full-time Goal status: On Going Kasandra Knudsen) 12/06/2021   6.  Nichalos will be independent with his HEP at DC Baseline: Started 11/10/2021 Goal status: On Going 12/06/2021     PLAN: PT FREQUENCY:  2-3X/week   PT DURATION: 8 weeks   PLANNED  INTERVENTIONS: Therapeutic exercises, Therapeutic activity, Neuromuscular re-education, Balance training, Gait training, Patient/Family education, Self  Care, Joint mobilization, Stair training, Cryotherapy, Vasopneumatic device, and Manual therapy   PLAN FOR NEXT SESSION:  Continue to progress quadriceps strengthening, particularly eccentric quad control.  Encourage stretching into extension, instruct in getting on/off rowing machine if he shows picture.  Farley Ly, PT, MPT 12/06/2021, 2:26 PM

## 2021-12-08 ENCOUNTER — Encounter: Payer: BC Managed Care – PPO | Admitting: Rehabilitative and Restorative Service Providers"

## 2021-12-11 ENCOUNTER — Encounter: Payer: Self-pay | Admitting: Physical Therapy

## 2021-12-11 ENCOUNTER — Ambulatory Visit (INDEPENDENT_AMBULATORY_CARE_PROVIDER_SITE_OTHER): Payer: BC Managed Care – PPO | Admitting: Physical Therapy

## 2021-12-11 DIAGNOSIS — M25662 Stiffness of left knee, not elsewhere classified: Secondary | ICD-10-CM | POA: Diagnosis not present

## 2021-12-11 DIAGNOSIS — M25562 Pain in left knee: Secondary | ICD-10-CM

## 2021-12-11 DIAGNOSIS — M6281 Muscle weakness (generalized): Secondary | ICD-10-CM

## 2021-12-11 DIAGNOSIS — R262 Difficulty in walking, not elsewhere classified: Secondary | ICD-10-CM | POA: Diagnosis not present

## 2021-12-11 DIAGNOSIS — R600 Localized edema: Secondary | ICD-10-CM

## 2021-12-11 NOTE — Therapy (Signed)
OUTPATIENT PHYSICAL THERAPY TREATMENT   Patient Name: Zachary [name]. MRN: [id number] DOB:10/11/1955, 66 y.o., male Today's Date: 12/11/2021  PCP: Wendie Agreste, MD REFERRING PROVIDER: Mcarthur Rossetti, MD  END OF SESSION:   PT End of Session - 12/11/21 1439     Visit Number 12    Number of Visits 18    Date for PT Re-Evaluation 01/05/22    Progress Note Due on Visit 19    PT Start Time 1432    PT Stop Time 1517    PT Time Calculation (min) 45 min    Activity Tolerance Patient tolerated treatment well;No increased pain    Behavior During Therapy WFL for tasks assessed/performed              Past Medical History:  Diagnosis Date   Allergy    Arthritis    Asthma    Blood transfusion without reported diagnosis    as child   Constipation    Heart murmur    mild   Hyperlipidemia    slight elevation   Hypertension    Past Surgical History:  Procedure Laterality Date   APPENDECTOMY     age 75   COLONOSCOPY  2008   KNEE SURGERY Left 1982   pilonidal abcess     x 2 surgeries to correct this   TONSILLECTOMY     as a child   TOTAL KNEE ARTHROPLASTY Left 11/03/2021   Procedure: LEFT TOTAL KNEE ARTHROPLASTY;  Surgeon: Mcarthur Rossetti, MD;  Location: [facility];  Service: Orthopedics;  Laterality: Left;   Patient Active Problem List   Diagnosis Date Noted   Status post total left knee replacement 11/03/2021   Unilateral primary osteoarthritis, left knee 11/02/2021   OBESITY 05/09/2009   Essential hypertension 05/09/2009   HEMOCCULT POSITIVE STOOL 05/09/2009   HEART MURMUR, HX OF 05/09/2009    REFERRING DIAG: O35.009 (ICD-10-CM) - H/O total knee replacement, left   ONSET DATE: November 03, 2021  THERAPY DIAG:  Difficulty in walking, not elsewhere classified  Muscle weakness (generalized)  Localized edema  Stiffness of left knee, not elsewhere classified  Left knee pain, unspecified chronicity  Rationale for Evaluation and Treatment  Rehabilitation  PERTINENT HISTORY: HTN, obesity, previous L ACL reconstruction  PRECAUTIONS: Be aware of intermittent L sciatica  SUBJECTIVE: He came in without the cane, leaving it behind in the office today. He had a busy weekend, up on his feet for most of the day and feeling well in the evening and before sleep. In the office his watch prompts him to stand and move about every hour, and he props his heel for knee extension while he is seated at the office. He is noting some increased swelling in his LLE below the knee.  PAIN:  Are you having pain? Yes: NPRS scale: 0/10 today Pain location: L knee Pain description: Achy, dull, stiff Aggravating factors: Prolonged postures and prolonged WB Relieving factors: Change of position, pain meds, ice, elevation  OBJECTIVE: (objective measures completed at initial evaluation unless otherwise dated)   DIAGNOSTIC FINDINGS: 11/03/21  Status post interval left total knee arthroplasty. The hardware is well positioned. There is no evidence of acute fracture or dislocation. Anterior skin staples are present. There is gas in the joint and the anterior soft tissues   PATIENT SURVEYS:  FOTO 12/01/2021 50 (Goal 73) FOTO   11/10/2021  40 (risk adjusted 27) Goal 73  in 16 visits  SENSATION: [name] notes improving L sciatica with prolonged sitting   EDEMA:  Noted and not measured     LOWER EXTREMITY ROM:   ROM (A: AROM, P: PROM) Right eval Left eval Left 11/13/21 Left 11/15/21 Left 11/16/21 Left 11/21/21 Left 11/28/21 Right 12/01/21 Left 12/01/21 Left 12/11/21  Knee flexion A: 120 A: 77 Seated P: 94* Supine  AA: 90* Supine PROM 97 Supine A: 100 P: 106 Seated P: 110* Supine  A: 120* Supine  A: 106*   Knee extension A: -12 A: -20 Supine P: -6* Supine AA: -3* Supine AROM -10, PROM -6 Supine A: -7 P: -4  Supine  A: -5* Supine  A: -10* Supine  P: -14*   (Blank rows = not tested)   LOWER EXTREMITY Strength:   Deferred due to AROM  impairments noted at evaluation Right eval Left eval Right 12/11/21 Left 12/11/21  Knee flexion   3-/5  27.8# & 32.0# 36.7# & 34.5# 119.1% Rt  Knee extension   3-/5  96.9# & 85.7# 93.6# & 99.2# 105.6% Rt   (Blank rows = not tested)   MUSCLE LENGTH TESTING: 12/11/2021 [name] Test: Lt appeared 5* from neutral Hamstring length: Lt appeared 30* from neutral  GAIT: 12/11/2021: Pt ambulates without AD x 150' with reduced Lt knee extension in stance, no losses of balance or instability 11/23/2021: Pt ambulates with cane x 230' including ramp and curb with no losses of balance or knee instability 11/13/2021:  pt amb with RW with LLE stiff leg with knee flexed in stance & minimal increase in flexion for swing. 11/10/2021: Aking is using a wheeled walker post-TKA   TODAY'S TREATMENT: 12/11/2021 TherEx: -Recumbent bike Seat 10 for 8 minutes Level 3 -Leg extension machine 20# BLE up, LLE eccentric with cues for slow eccentric -Leg curl machine 25# LLE 2 x 10 with cues for slow eccentric -Dynamometer testing as noted in objective (2-3 trials of max flexion and extension isometric bil.). Used mob belt to stabilize dynameter.  -Standing Lt knee extension against blue band x 10 with 3s hold, noted compensatory hip retraction  12/06/2021 -Recumbent bike Seat 10 for 8 minutes Level 3 -Seated knee extension stretch L foot in chair with 7.5# weight hanging from just above the knee (distal thigh) for 3 minutes -Tailgate knee flexion 1 minute -Quadriceps sets 2 sets of 10 for 5 seconds with L heel prop -AAROM knee flexion (R pushes L into flexion) 10X 10 seconds -Prone knee extension stretch 2# for 3 minutes (rolled up towels above both knees at the foot of the treatment table)  Functional Activities for sit to stand and stairs: -Leg Press L leg only 75# slow eccentrics and push into full extension 2 sets of 15 -Step-up and over with 8 inch step -Step-down off 4 inch step 2 sets of 10X slow  eccentrics   12/04/2021 TherEx: Gastroc stretch on step 4 x 30s Standing hamstring on step 2 x 30s Standing quad stretch with towel 1 x 30s Standing adductor stretch 2 x 30s Heel/toe rise x 10 with 3 sec hold without UE assist PT demo & verbalize understanding for above exercises.  Pt verbalized understanding for these exercises outside of PT.  TherAct: 6" step LLE up over down x 10 reps, verbal and visual cueing for knee extension and controlled eccentric 6" step lateral LLE stance with BUE support/BUE fingertip support x 10 reps, verbal cueing for controlled eccentric BOSU round side LLE lateral step up and stance with RLE anterior and  posterior toe tap then LLE eccentric lower x 10 reps, heavy BUE support and verbal cueing for slow eccentric  Manual: Knee extension grade 3/4 mobs with towel roll heel prop  Vaso x 10 min; mod pressure; 34 deg; Lt knee elevated    PATIENT EDUCATION:  Education details: Reviewed exam findings and HEP on vaso Person educated: Patient and Spouse Education method: Explanation, Demonstration, Tactile cues, Verbal cues, and Handouts Education comprehension: verbalized understanding, returned demonstration, verbal cues required, tactile cues required, and needs further education     HOME EXERCISE PROGRAM: Access Code: [email or web address] URL: [email or web address] Date: 11/13/2021 Prepared by: Jamey Reas  Exercises - Supine Quadricep Sets  - 3-5 x daily - 7 x weekly - 2-3 sets - 10 reps - 5 second hold - Supine Heel Slide with Strap  - 2-3 x daily - 7 x weekly - 2-3 sets - 10 reps - 5 seconds hold - Supine Straight Leg Raises  - 2-3 x daily - 7 x weekly - 2-3 sets - 10 reps - 5 seconds hold - Seated Knee Flexion AAROM  - 3-5 x daily - 7 x weekly - 1 sets - 1 reps - 3 minutes hold - Seated Knee Extension Stretch with Chair  - 1 x daily - 7 x weekly - 1 sets - 1 reps - 3-5 minutes hold - Seated Knee Flexion Extension AROM   - 2-3 x daily - 7 x  weekly - 2-3 sets - 10 reps - 5 seconds hold - Seated Quad Set  - 2-3 x daily - 7 x weekly - 2-3 sets - 10 reps - 5 seconds hold - Standing Lumbar Extension at Wall - Forearms  - 5 x daily - 7 x weekly - 1 sets - 5 reps - 3 seconds hold   ASSESSMENT:   CLINICAL IMPRESSION: Dynamometer strength testing presented Lt knee extension at 106% of Rt and Lt knee flexion at 119% of Rt, indicating improved LLE strength. He ambulates without cane safely and presents with reduced Lt knee extension in stance. He remains limited in extension in all positions by both stiffness and tightness, to be addressed with increased hamstring stretching and functional hip flexor stretching for upright posture. He continues to benefit from skilled PT.   OBJECTIVE IMPAIRMENTS Abnormal gait, decreased activity tolerance, decreased balance, decreased endurance, decreased knowledge of condition, difficulty walking, decreased ROM, decreased strength, increased edema, obesity, and pain.    ACTIVITY LIMITATIONS carrying, lifting, bending, sitting, standing, squatting, sleeping, stairs, and locomotion level   PARTICIPATION LIMITATIONS: driving, community activity, occupation, and church   PERSONAL FACTORS HTN, obesity, previous L ACL reconstruction are also affecting patient's functional outcome.    REHAB POTENTIAL: Good   CLINICAL DECISION MAKING: Stable/uncomplicated   EVALUATION COMPLEXITY: Low     GOALS: Goals reviewed with patient? Yes   SHORT TERM GOALS: Target date: 12/08/2021  Improve L knee AROM to 10 - 100 degrees Baseline: 20 - 77 degrees Goal status: MET 11/21/21   2.  TRUE will be able to walk with a cane outside the home Baseline: Walker full-time Goal status: Met 12/01/2021     LONG TERM GOALS: Target date: 01/05/2022    Improve FOTO to 73 Baseline: 40 Goal status: On Going (50 12/01/2021)   2.  Basheer will report L knee pain consistently 0-3/10 on the NPRS Baseline: 5-8/10 Goal status: On Going  12/06/2021   3.  Improve L knee AROM to 5 - 110 degrees Baseline: 20 - 77 degrees  Goal status: On Going (10 - 106 12/06/2021)   4.  Improve L quadriceps strength as assessed by MMT and FOTO scores Baseline: Deferred due to sciatic pain at evaluation Goal status: Improving 12/11/2021   5.  Asher will be able to walk without an assistive device at DC Baseline: Walker full-time Goal status: met 12/11/2021 but need to assess ramp and curbs for community safety   6.  Siris will be independent with his HEP at DC Baseline: Started 11/10/2021 Goal status: On Going 12/06/2021     PLAN: PT FREQUENCY:  2-3X/week   PT DURATION: 8 weeks   PLANNED INTERVENTIONS: Therapeutic exercises, Therapeutic activity, Neuromuscular re-education, Balance training, Gait training, Patient/Family education, Self Care, Joint mobilization, Stair training, Cryotherapy, Vasopneumatic device, and Manual therapy   PLAN FOR NEXT SESSION: Assess ramp and curb without AD. Standing hip flexor stretch, hamstring stretch, gastroc stretch. Progressive quadriceps strengthening and manual for extension mobility. Vaso to end if indicated  Jana Hakim, Student-PT 12/11/2021, 4:30 PM  This entire session of physical therapy was performed under the direct supervision of PT signing evaluation /treatment. PT reviewed note and agrees.  Jamey Reas, PT, DPT 12/11/2021 4:32 PM

## 2021-12-13 ENCOUNTER — Encounter: Payer: BC Managed Care – PPO | Admitting: Physical Therapy

## 2021-12-18 ENCOUNTER — Ambulatory Visit (INDEPENDENT_AMBULATORY_CARE_PROVIDER_SITE_OTHER): Payer: Medicare Other | Admitting: Orthopaedic Surgery

## 2021-12-18 ENCOUNTER — Ambulatory Visit (INDEPENDENT_AMBULATORY_CARE_PROVIDER_SITE_OTHER): Payer: BC Managed Care – PPO | Admitting: Physical Therapy

## 2021-12-18 ENCOUNTER — Encounter: Payer: Self-pay | Admitting: Orthopaedic Surgery

## 2021-12-18 ENCOUNTER — Encounter: Payer: Self-pay | Admitting: Physical Therapy

## 2021-12-18 DIAGNOSIS — M6281 Muscle weakness (generalized): Secondary | ICD-10-CM | POA: Diagnosis not present

## 2021-12-18 DIAGNOSIS — R262 Difficulty in walking, not elsewhere classified: Secondary | ICD-10-CM

## 2021-12-18 DIAGNOSIS — R600 Localized edema: Secondary | ICD-10-CM

## 2021-12-18 DIAGNOSIS — Z96652 Presence of left artificial knee joint: Secondary | ICD-10-CM

## 2021-12-18 DIAGNOSIS — M25662 Stiffness of left knee, not elsewhere classified: Secondary | ICD-10-CM

## 2021-12-18 NOTE — Therapy (Signed)
OUTPATIENT PHYSICAL THERAPY TREATMENT   Patient Name: Zachary [name]. MRN: [id number] DOB:27-Dec-1955, 66 y.o., male Today's Date: 12/18/2021  PCP: Wendie Agreste, MD REFERRING PROVIDER: Mcarthur Rossetti, MD  END OF SESSION:   PT End of Session - 12/18/21 1434     Visit Number 13    Number of Visits 18    Date for PT Re-Evaluation 01/05/22    Progress Note Due on Visit 19    PT Start Time 1429    PT Stop Time 1515    PT Time Calculation (min) 46 min    Activity Tolerance Patient tolerated treatment well;No increased pain    Behavior During Therapy WFL for tasks assessed/performed               Past Medical History:  Diagnosis Date   Allergy    Arthritis    Asthma    Blood transfusion without reported diagnosis    as child   Constipation    Heart murmur    mild   Hyperlipidemia    slight elevation   Hypertension    Past Surgical History:  Procedure Laterality Date   APPENDECTOMY     age 38   COLONOSCOPY  2008   KNEE SURGERY Left 1982   pilonidal abcess     x 2 surgeries to correct this   TONSILLECTOMY     as a child   TOTAL KNEE ARTHROPLASTY Left 11/03/2021   Procedure: LEFT TOTAL KNEE ARTHROPLASTY;  Surgeon: Mcarthur Rossetti, MD;  Location: [facility];  Service: Orthopedics;  Laterality: Left;   Patient Active Problem List   Diagnosis Date Noted   Status post total left knee replacement 11/03/2021   Unilateral primary osteoarthritis, left knee 11/02/2021   OBESITY 05/09/2009   Essential hypertension 05/09/2009   HEMOCCULT POSITIVE STOOL 05/09/2009   HEART MURMUR, HX OF 05/09/2009    REFERRING DIAG: W09.811 (ICD-10-CM) - H/O total knee replacement, left   ONSET DATE: November 03, 2021  THERAPY DIAG:  Difficulty in walking, not elsewhere classified  Muscle weakness (generalized)  Localized edema  Stiffness of left knee, not elsewhere classified  Rationale for Evaluation and Treatment Rehabilitation  PERTINENT HISTORY: HTN,  obesity, previous L ACL reconstruction  PRECAUTIONS: Be aware of intermittent L sciatica  SUBJECTIVE: He had some increased soreness after muscle testing last session that lasted about a day and a half. He is doing well and walking without the cane, with some increased Rt knee soreness. He says the Lt knee is doing much better. He and his wife participated in a funeral for a member of their church over the weekend so he had some higher activity.  PAIN:  Are you having pain? Yes: NPRS scale: 0/10 today Pain location: L knee Pain description: Achy, dull, stiff Aggravating factors: Prolonged postures and prolonged WB Relieving factors: Change of position, pain meds, ice, elevation  OBJECTIVE: (objective measures completed at initial evaluation unless otherwise dated)   DIAGNOSTIC FINDINGS: 11/03/21  Status post interval left total knee arthroplasty. The hardware is well positioned. There is no evidence of acute fracture or dislocation. Anterior skin staples are present. There is gas in the joint and the anterior soft tissues   PATIENT SURVEYS:  FOTO 12/01/2021 50 (Goal 73) FOTO   11/10/2021  40 (risk adjusted 27) Goal 73  in 16 visits            SENSATION: [name] notes improving L sciatica with prolonged sitting   EDEMA:  Noted  and not measured     LOWER EXTREMITY ROM:   ROM (A: AROM, P: PROM) Right eval Left eval Left 11/13/21 Left 11/15/21 Left 11/16/21 Left 11/21/21 Left 11/28/21 Right 12/01/21 Left 12/01/21 Left 12/11/21 Left 12/18/21  Knee flexion A: 120 A: 77 Seated P: 94* Supine  AA: 90* Supine PROM 97 Supine A: 100 P: 106 Seated P: 110* Supine  A: 120* Supine  A: 106*  Seated A: 112* P: 117*  Knee extension A: -12 A: -20 Supine P: -6* Supine AA: -3* Supine AROM -10, PROM -6 Supine A: -7 P: -4  Supine  A: -5* Supine  A: -10* Supine  P: -14* Seated LAQ A: -15* Standing A: -7*   (Blank rows = not tested)   LOWER EXTREMITY Strength:   Deferred due to AROM  impairments noted at evaluation Right eval Left eval Right 12/11/21 Left 12/11/21  Knee flexion   3-/5  27.8# & 32.0# 36.7# & 34.5# 119.1% Rt  Knee extension   3-/5  96.9# & 85.7# 93.6# & 99.2# 105.6% Rt   (Blank rows = not tested)   MUSCLE LENGTH TESTING: 12/11/2021 [name] Test: Lt appeared 5* from neutral Hamstring length: Lt appeared 30* from neutral  GAIT: 12/18/2021: Pt ambulates in clinic without AD including ramp and curb with no losses of balance or instability 12/11/2021: Pt ambulates without AD x 150' with reduced Lt knee extension in stance, no losses of balance or instability 11/23/2021: Pt ambulates with cane x 230' including ramp and curb with no losses of balance or knee instability 11/13/2021:  pt amb with RW with LLE stiff leg with knee flexed in stance & minimal increase in flexion for swing. 11/10/2021: Bilal is using a wheeled walker post-TKA   TODAY'S TREATMENT: 12/13/2021 TherEx: -Recumbent bike Seat 10 for 8 minutes - 3 min level 2, 1 min level 4, 1 min level 2, 1 min level 4, 2 min level 2 -Standing hip flexor stretch at counter x 30s, noted increased low back arthritic pain -Standing hip flexor stretch at doorway to neutral upright posture with single UE upward reach alternating 2 x 2 deep breaths, BUE upward reach x 2 deep breaths -Standing gastroc stretch at step 2 x 30s -Standing hamstring stretch at step 2 x 30s  Therapeutic Activity -Pt ascend and descend curb and ramp without AD, no noted losses of balance or instability  Manual: -Lt knee extension belted grade 3 mobs in standing 2" gastroc stretch with ball toss x 1 min -Lt knee extension belted grade 3 mobs in terminal stance position x 30s  Neuro Re-ed: -Lt stance with Rt foot on ball A-P, M-L, CW and CCW circles x 10 ea. direction, cueing for upright posture and pt needed occasional UE touch  12/11/2021 TherEx: -Recumbent bike Seat 10 for 8 minutes Level 3 -Leg extension machine 20# BLE up, LLE  eccentric with cues for slow eccentric -Leg curl machine 25# LLE 2 x 10 with cues for slow eccentric -Dynamometer testing as noted in objective (2-3 trials of max flexion and extension isometric bil.). Used mob belt to stabilize dynameter.  -Standing Lt knee extension against blue band x 10 with 3s hold, noted compensatory hip retraction  12/06/2021 -Recumbent bike Seat 10 for 8 minutes Level 3 -Seated knee extension stretch L foot in chair with 7.5# weight hanging from just above the knee (distal thigh) for 3 minutes -Tailgate knee flexion 1 minute -Quadriceps sets 2 sets of 10 for 5 seconds with L heel prop -  AAROM knee flexion (R pushes L into flexion) 10X 10 seconds -Prone knee extension stretch 2# for 3 minutes (rolled up towels above both knees at the foot of the treatment table)  Functional Activities for sit to stand and stairs: -Leg Press L leg only 75# slow eccentrics and push into full extension 2 sets of 15 -Step-up and over with 8 inch step -Step-down off 4 inch step 2 sets of 10X slow eccentrics    PATIENT EDUCATION:  Education details: Reviewed exam findings and HEP on vaso Person educated: Patient and Spouse Education method: Explanation, Demonstration, Tactile cues, Verbal cues, and Handouts Education comprehension: verbalized understanding, returned demonstration, verbal cues required, tactile cues required, and needs further education     HOME EXERCISE PROGRAM: Access Code: [email or web address] URL: [email or web address] Date: 11/13/2021 Prepared by: Jamey Reas  Exercises - Supine Quadricep Sets  - 3-5 x daily - 7 x weekly - 2-3 sets - 10 reps - 5 second hold - Supine Heel Slide with Strap  - 2-3 x daily - 7 x weekly - 2-3 sets - 10 reps - 5 seconds hold - Supine Straight Leg Raises  - 2-3 x daily - 7 x weekly - 2-3 sets - 10 reps - 5 seconds hold - Seated Knee Flexion AAROM  - 3-5 x daily - 7 x weekly - 1 sets - 1 reps - 3 minutes hold - Seated Knee  Extension Stretch with Chair  - 1 x daily - 7 x weekly - 1 sets - 1 reps - 3-5 minutes hold - Seated Knee Flexion Extension AROM   - 2-3 x daily - 7 x weekly - 2-3 sets - 10 reps - 5 seconds hold - Seated Quad Set  - 2-3 x daily - 7 x weekly - 2-3 sets - 10 reps - 5 seconds hold - Standing Lumbar Extension at Wall - Forearms  - 5 x daily - 7 x weekly - 1 sets - 5 reps - 3 seconds hold   ASSESSMENT:   CLINICAL IMPRESSION: He appears safe to use no AD in the community with no losses of balance or instability on the ramp and curb. He met the long term active flexion goal today, and continues improving in his active knee extension range. Hip flexor, hamstring, and gastroc stretches were reinforced today to continue at home in order to promote knee extension mobility. He continues to benefit from skilled PT to address mobility, strength, and balance impairments.   OBJECTIVE IMPAIRMENTS Abnormal gait, decreased activity tolerance, decreased balance, decreased endurance, decreased knowledge of condition, difficulty walking, decreased ROM, decreased strength, increased edema, obesity, and pain.    ACTIVITY LIMITATIONS carrying, lifting, bending, sitting, standing, squatting, sleeping, stairs, and locomotion level   PARTICIPATION LIMITATIONS: driving, community activity, occupation, and church   PERSONAL FACTORS HTN, obesity, previous L ACL reconstruction are also affecting patient's functional outcome.    REHAB POTENTIAL: Good   CLINICAL DECISION MAKING: Stable/uncomplicated   EVALUATION COMPLEXITY: Low     GOALS: Goals reviewed with patient? Yes   SHORT TERM GOALS: Target date: 12/08/2021  Improve L knee AROM to 10 - 100 degrees Baseline: 20 - 77 degrees Goal status: MET 11/21/21   2.  Miley will be able to walk with a cane outside the home Baseline: Walker full-time Goal status: Met 12/01/2021     LONG TERM GOALS: Target date: 01/05/2022    Improve FOTO to 73 Baseline: 40 Goal status:  On Going (50 12/01/2021)   2.  [name]  will report L knee pain consistently 0-3/10 on the NPRS Baseline: 5-8/10 Goal status: On Going 12/18/2021   3.  Improve L knee AROM to 5 - 110 degrees Baseline: 20 - 77 degrees Goal status: On Going (15 - 112 12/18/2021)   4.  Improve L quadriceps strength as assessed by MMT and FOTO scores Baseline: Deferred due to sciatic pain at evaluation Goal status: Improving 12/11/2021   5.  Zeric will be able to walk without an assistive device at DC Baseline: Walker full-time Goal status: met 12/11/2021, met 12/18/2021 ramp and curb   6.  Rahmir will be independent with his HEP at DC Baseline: Started 11/10/2021 Goal status: On Going 12/18/2021     PLAN: PT FREQUENCY:  2-3X/week   PT DURATION: 8 weeks   PLANNED INTERVENTIONS: Therapeutic exercises, Therapeutic activity, Neuromuscular re-education, Balance training, Gait training, Patient/Family education, Self Care, Joint mobilization, Stair training, Cryotherapy, Vasopneumatic device, and Manual therapy   PLAN FOR NEXT SESSION: continue progressive quadriceps strengthening and manual for extension mobility. 6" step and static balance activity with variable BOS. Vaso to end if indicated   Jana Hakim, Student-PT 12/18/2021, 3:20 PM  This entire session of physical therapy was performed under the direct supervision of PT signing evaluation /treatment. PT reviewed note and agrees.  Jamey Reas, PT, DPT 12/18/2021 3:24 PM

## 2021-12-18 NOTE — Progress Notes (Signed)
The patient is 6 weeks status post a left total knee arthroplasty.  He is doing well and only taking Tylenol for pain.  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  Examination of his left knee shows almost full extension and almost full flexion.  It feels ligamentously stable.  There are some slight warmth and swelling to be expected.  From my standpoint he will continue to increase activities as comfort allows.  I will see him back in 3 months with a standing AP and lateral of his left operative knee.

## 2021-12-20 ENCOUNTER — Encounter: Payer: Self-pay | Admitting: Physical Therapy

## 2021-12-20 ENCOUNTER — Ambulatory Visit (INDEPENDENT_AMBULATORY_CARE_PROVIDER_SITE_OTHER): Payer: BC Managed Care – PPO | Admitting: Physical Therapy

## 2021-12-20 DIAGNOSIS — M25662 Stiffness of left knee, not elsewhere classified: Secondary | ICD-10-CM

## 2021-12-20 DIAGNOSIS — R262 Difficulty in walking, not elsewhere classified: Secondary | ICD-10-CM

## 2021-12-20 DIAGNOSIS — R600 Localized edema: Secondary | ICD-10-CM | POA: Diagnosis not present

## 2021-12-20 DIAGNOSIS — M6281 Muscle weakness (generalized): Secondary | ICD-10-CM

## 2021-12-20 DIAGNOSIS — M25562 Pain in left knee: Secondary | ICD-10-CM

## 2021-12-20 NOTE — Therapy (Signed)
OUTPATIENT PHYSICAL THERAPY TREATMENT   Patient Name: Zachary [name]. MRN: [id number] DOB:May 08, 1955, 66 y.o., male Today's Date: 12/20/2021  PCP: Wendie Agreste, MD REFERRING PROVIDER: Mcarthur Rossetti, MD  END OF SESSION:   PT End of Session - 12/20/21 1432     Visit Number 14    Number of Visits 18    Date for PT Re-Evaluation 01/05/22    Progress Note Due on Visit 19    PT Start Time 1430    PT Stop Time 1515    PT Time Calculation (min) 45 min    Activity Tolerance Patient tolerated treatment well;No increased pain    Behavior During Therapy WFL for tasks assessed/performed                Past Medical History:  Diagnosis Date   Allergy    Arthritis    Asthma    Blood transfusion without reported diagnosis    as child   Constipation    Heart murmur    mild   Hyperlipidemia    slight elevation   Hypertension    Past Surgical History:  Procedure Laterality Date   APPENDECTOMY     age 65   COLONOSCOPY  2008   KNEE SURGERY Left 1982   pilonidal abcess     x 2 surgeries to correct this   TONSILLECTOMY     as a child   TOTAL KNEE ARTHROPLASTY Left 11/03/2021   Procedure: LEFT TOTAL KNEE ARTHROPLASTY;  Surgeon: Mcarthur Rossetti, MD;  Location: [facility];  Service: Orthopedics;  Laterality: Left;   Patient Active Problem List   Diagnosis Date Noted   Status post total left knee replacement 11/03/2021   Unilateral primary osteoarthritis, left knee 11/02/2021   OBESITY 05/09/2009   Essential hypertension 05/09/2009   HEMOCCULT POSITIVE STOOL 05/09/2009   HEART MURMUR, HX OF 05/09/2009    REFERRING DIAG: W09.811 (ICD-10-CM) - H/O total knee replacement, left   ONSET DATE: November 03, 2021  THERAPY DIAG:  Difficulty in walking, not elsewhere classified  Muscle weakness (generalized)  Localized edema  Stiffness of left knee, not elsewhere classified  Left knee pain, unspecified chronicity  Rationale for Evaluation and Treatment  Rehabilitation  PERTINENT HISTORY: HTN, obesity, previous L ACL reconstruction  PRECAUTIONS: Be aware of intermittent L sciatica  SUBJECTIVE: He has been doing his exercises. He saw Dr. Ninfa Linden who is pleased with his range.    PAIN:  Are you having pain? Yes: NPRS scale:  0/10 today Pain location: L knee Pain description: Achy, dull, stiff Aggravating factors: Prolonged postures and prolonged WB Relieving factors: Change of position, pain meds, ice, elevation  OBJECTIVE: (objective measures completed at initial evaluation unless otherwise dated)   DIAGNOSTIC FINDINGS: 11/03/21  Status post interval left total knee arthroplasty. The hardware is well positioned. There is no evidence of acute fracture or dislocation. Anterior skin staples are present. There is gas in the joint and the anterior soft tissues   PATIENT SURVEYS:  FOTO 12/01/2021 50 (Goal 73) FOTO   11/10/2021  40 (risk adjusted 27) Goal 73  in 16 visits            SENSATION: [name] notes improving L sciatica with prolonged sitting   EDEMA:  Noted and not measured     LOWER EXTREMITY ROM:   ROM (A: AROM, P: PROM) Right eval Left eval Left 11/13/21 Left 11/15/21 Left 11/16/21 Left 11/21/21 Left 11/28/21 Right 12/01/21 Left 12/01/21 Left 12/11/21 Left 12/18/21  Knee flexion A: 120 A: 77 Seated P: 94* Supine  AA: 90* Supine PROM 97 Supine A: 100 P: 106 Seated P: 110* Supine  A: 120* Supine  A: 106*  Seated A: 112* P: 117*  Knee extension A: -12 A: -20 Supine P: -6* Supine AA: -3* Supine AROM -10, PROM -6 Supine A: -7 P: -4  Supine  A: -5* Supine  A: -10* Supine  P: -14* Seated LAQ A: -15* Standing A: -7*   (Blank rows = not tested)   LOWER EXTREMITY Strength:   Deferred due to AROM impairments noted at evaluation Right eval Left eval Right 12/11/21 Left 12/11/21  Knee flexion   3-/5  27.8# & 32.0# 36.7# & 34.5# 119.1% Rt  Knee extension   3-/5  96.9# & 85.7# 93.6# & 99.2# 105.6% Rt   (Blank rows =  not tested)   MUSCLE LENGTH TESTING: 12/11/2021 [name] Test: Lt appeared 5* from neutral Hamstring length: Lt appeared 30* from neutral  GAIT: 12/18/2021: Pt ambulates in clinic without AD including ramp and curb with no losses of balance or instability 12/11/2021: Pt ambulates without AD x 150' with reduced Lt knee extension in stance, no losses of balance or instability 11/23/2021: Pt ambulates with cane x 230' including ramp and curb with no losses of balance or knee instability 11/13/2021:  pt amb with RW with LLE stiff leg with knee flexed in stance & minimal increase in flexion for swing. 11/10/2021: Amaree is using a wheeled walker post-TKA   TODAY'S TREATMENT: 12/20/2021 TherEx: -Recumbent bike Seat 10 for 8 minutes - 6 min level 4, 1 min level 6, 1 min level 4 -Standing gastroc stretch at step 2 x 30s -step up, SLS knee ext & step down BOSU round side up BUE support 10 reps 1st set - LLE SLS on BOSU round side up reaching RLE ant & post 10 reps -lateral step up on BOSU round side up with RLE hip flexion to 90* with LLE terminal knee ext -prone knee flexion 5# 10 reps 2 sets PT recommended returning to gym 1x/wk while in PT to trial exercises & bring questions back to PT. PT recommended alternating between machines that work knee & other machines like UE or trunk to enable recovery of muscles / less fatigue with repetitive knee issues. Pt & wife verbalized understanding.   Manual: -Lt knee extension belted grade 3 mobs in standing gastroc stretch -prone manual knee ext stretch with soft tissue manual stretch progressed to include active quad set reaching toes towards floor    12/13/2021 TherEx: -Recumbent bike Seat 10 for 8 minutes - 3 min level 2, 1 min level 4, 1 min level 2, 1 min level 4, 2 min level 2 -Standing hip flexor stretch at counter x 30s, noted increased low back arthritic pain -Standing hip flexor stretch at doorway to neutral upright posture with single UE upward  reach alternating 2 x 2 deep breaths, BUE upward reach x 2 deep breaths -Standing gastroc stretch at step 2 x 30s -Standing hamstring stretch at step 2 x 30s  Therapeutic Activity -Pt ascend and descend curb and ramp without AD, no noted losses of balance or instability  Manual: -Lt knee extension belted grade 3 mobs in standing 2" gastroc stretch with ball toss x 1 min -Lt knee extension belted grade 3 mobs in terminal stance position x 30s  Neuro Re-ed: -Lt stance with Rt foot on ball A-P, M-L, CW and CCW circles x 10 ea. direction,  cueing for upright posture and pt needed occasional UE touch  12/11/2021 TherEx: -Recumbent bike Seat 10 for 8 minutes Level 3 -Leg extension machine 20# BLE up, LLE eccentric with cues for slow eccentric -Leg curl machine 25# LLE 2 x 10 with cues for slow eccentric -Dynamometer testing as noted in objective (2-3 trials of max flexion and extension isometric bil.). Used mob belt to stabilize dynameter.  -Standing Lt knee extension against blue band x 10 with 3s hold, noted compensatory hip retraction    PATIENT EDUCATION:  Education details: Reviewed exam findings and HEP on vaso Person educated: Patient and Spouse Education method: Explanation, Demonstration, Tactile cues, Verbal cues, and Handouts Education comprehension: verbalized understanding, returned demonstration, verbal cues required, tactile cues required, and needs further education     HOME EXERCISE PROGRAM: Access Code: [email or web address] URL: [email or web address] Date: 11/13/2021 Prepared by: Jamey Reas  Exercises - Supine Quadricep Sets  - 3-5 x daily - 7 x weekly - 2-3 sets - 10 reps - 5 second hold - Supine Heel Slide with Strap  - 2-3 x daily - 7 x weekly - 2-3 sets - 10 reps - 5 seconds hold - Supine Straight Leg Raises  - 2-3 x daily - 7 x weekly - 2-3 sets - 10 reps - 5 seconds hold - Seated Knee Flexion AAROM  - 3-5 x daily - 7 x weekly - 1 sets - 1 reps - 3 minutes  hold - Seated Knee Extension Stretch with Chair  - 1 x daily - 7 x weekly - 1 sets - 1 reps - 3-5 minutes hold - Seated Knee Flexion Extension AROM   - 2-3 x daily - 7 x weekly - 2-3 sets - 10 reps - 5 seconds hold - Seated Quad Set  - 2-3 x daily - 7 x weekly - 2-3 sets - 10 reps - 5 seconds hold - Standing Lumbar Extension at Wall - Forearms  - 5 x daily - 7 x weekly - 1 sets - 5 reps - 3 seconds hold   ASSESSMENT:   CLINICAL IMPRESSION: PT progressed intensity of therapeutic exercises which he tolerated without pain and improved motion & control.  He would benefit from skilled PT to progress to full active knee extension with functional activities. He is on target to meet LTGs by end of current plan of care.    OBJECTIVE IMPAIRMENTS Abnormal gait, decreased activity tolerance, decreased balance, decreased endurance, decreased knowledge of condition, difficulty walking, decreased ROM, decreased strength, increased edema, obesity, and pain.    ACTIVITY LIMITATIONS carrying, lifting, bending, sitting, standing, squatting, sleeping, stairs, and locomotion level   PARTICIPATION LIMITATIONS: driving, community activity, occupation, and church   PERSONAL FACTORS HTN, obesity, previous L ACL reconstruction are also affecting patient's functional outcome.    REHAB POTENTIAL: Good   CLINICAL DECISION MAKING: Stable/uncomplicated   EVALUATION COMPLEXITY: Low     GOALS: Goals reviewed with patient? Yes   SHORT TERM GOALS: Target date: 12/08/2021  Improve L knee AROM to 10 - 100 degrees Baseline: 20 - 77 degrees Goal status: MET 11/21/21   2.  Quantavious will be able to walk with a cane outside the home Baseline: Walker full-time Goal status: Met 12/01/2021     LONG TERM GOALS: Target date: 01/05/2022    Improve FOTO to 73 Baseline: 40 Goal status: On Going (50 12/01/2021)   2.  Rohen will report L knee pain consistently 0-3/10 on the NPRS Baseline: 5-8/10 Goal status: On Going 12/18/2021  3.  Improve L knee AROM to 5 - 110 degrees Baseline: 20 - 77 degrees Goal status: On Going (15 - 112 12/18/2021)   4.  Improve L quadriceps strength as assessed by MMT and FOTO scores Baseline: Deferred due to sciatic pain at evaluation Goal status: Improving 12/11/2021   5.  Izreal will be able to walk without an assistive device at DC Baseline: Walker full-time Goal status: met 12/11/2021, met 12/18/2021 ramp and curb   6.  Izack will be independent with his HEP at DC Baseline: Started 11/10/2021 Goal status: On Going 12/18/2021     PLAN: PT FREQUENCY:  2-3X/week   PT DURATION: 8 weeks   PLANNED INTERVENTIONS: Therapeutic exercises, Therapeutic activity, Neuromuscular re-education, Balance training, Gait training, Patient/Family education, Self Care, Joint mobilization, Stair training, Cryotherapy, Vasopneumatic device, and Manual therapy   PLAN FOR NEXT SESSION: ask if he went to gym & how he did?,  continue progressive quadriceps strengthening and manual for extension mobility. balance activities.  Check ongoing LTGs.  Vaso to end if indicated   Jamey Reas, PT, DPT 12/20/2021, 3:39 PM

## 2021-12-25 ENCOUNTER — Encounter: Payer: Self-pay | Admitting: Physical Therapy

## 2021-12-25 ENCOUNTER — Ambulatory Visit (INDEPENDENT_AMBULATORY_CARE_PROVIDER_SITE_OTHER): Payer: BC Managed Care – PPO | Admitting: Physical Therapy

## 2021-12-25 DIAGNOSIS — R600 Localized edema: Secondary | ICD-10-CM

## 2021-12-25 DIAGNOSIS — M25662 Stiffness of left knee, not elsewhere classified: Secondary | ICD-10-CM

## 2021-12-25 DIAGNOSIS — R262 Difficulty in walking, not elsewhere classified: Secondary | ICD-10-CM | POA: Diagnosis not present

## 2021-12-25 DIAGNOSIS — M6281 Muscle weakness (generalized): Secondary | ICD-10-CM | POA: Diagnosis not present

## 2021-12-25 DIAGNOSIS — M25562 Pain in left knee: Secondary | ICD-10-CM

## 2021-12-25 NOTE — Therapy (Signed)
OUTPATIENT PHYSICAL THERAPY TREATMENT   Patient Name: Zachary [name]. MRN: [id number] DOB:[birth year], 66 y.o., male Today's Date: 12/25/2021  PCP: Wendie Agreste, MD REFERRING PROVIDER: Mcarthur Rossetti, MD  END OF SESSION:   PT End of Session - 12/25/21 1434     Visit Number 15    Number of Visits 18    Date for PT Re-Evaluation 01/05/22    Progress Note Due on Visit 19    PT Start Time 1429    PT Stop Time 1516    PT Time Calculation (min) 47 min    Activity Tolerance Patient tolerated treatment well;No increased pain    Behavior During Therapy WFL for tasks assessed/performed             Past Medical History:  Diagnosis Date   Allergy    Arthritis    Asthma    Blood transfusion without reported diagnosis    as child   Constipation    Heart murmur    mild   Hyperlipidemia    slight elevation   Hypertension    Past Surgical History:  Procedure Laterality Date   APPENDECTOMY     age 12   COLONOSCOPY  2008   KNEE SURGERY Left 1982   pilonidal abcess     x 2 surgeries to correct this   TONSILLECTOMY     as a child   TOTAL KNEE ARTHROPLASTY Left 11/03/2021   Procedure: LEFT TOTAL KNEE ARTHROPLASTY;  Surgeon: Mcarthur Rossetti, MD;  Location: [facility];  Service: Orthopedics;  Laterality: Left;   Patient Active Problem List   Diagnosis Date Noted   Status post total left knee replacement 11/03/2021   Unilateral primary osteoarthritis, left knee 11/02/2021   OBESITY 05/09/2009   Essential hypertension 05/09/2009   HEMOCCULT POSITIVE STOOL 05/09/2009   HEART MURMUR, HX OF 05/09/2009    REFERRING DIAG: Z00.174 (ICD-10-CM) - H/O total knee replacement, left   ONSET DATE: November 03, 2021  THERAPY DIAG:  Difficulty in walking, not elsewhere classified  Muscle weakness (generalized)  Localized edema  Stiffness of left knee, not elsewhere classified  Left knee pain, unspecified chronicity  Rationale for Evaluation and Treatment  Rehabilitation  PERTINENT HISTORY: HTN, obesity, previous L ACL reconstruction  PRECAUTIONS: Be aware of intermittent L sciatica  SUBJECTIVE: He has been doing well since the last appointment, and has been standing up straighter.  PAIN:  Are you having pain? Yes: NPRS scale: 0/10 today Pain location: L knee Pain description: Achy, dull, stiff Aggravating factors: Prolonged postures and prolonged WB Relieving factors: Change of position, pain meds, ice, elevation  OBJECTIVE: (objective measures completed at initial evaluation unless otherwise dated)   DIAGNOSTIC FINDINGS: 11/03/21  Status post interval left total knee arthroplasty. The hardware is well positioned. There is no evidence of acute fracture or dislocation. Anterior skin staples are present. There is gas in the joint and the anterior soft tissues   PATIENT SURVEYS:  FOTO 12/01/2021 50 (Goal 73) FOTO   11/10/2021  40 (risk adjusted 27) Goal 73  in 16 visits            SENSATION: [name] notes improving L sciatica with prolonged sitting   EDEMA:  Noted and not measured     LOWER EXTREMITY ROM:   ROM (A: AROM, P: PROM) Right eval Left eval Left 11/13/21 Left 11/15/21 Left 11/16/21 Left 11/21/21 Left 11/28/21 Right 12/01/21 Left 12/01/21 Left 12/11/21 Left 12/18/21 Left 12/25/21  Knee flexion A: 120  A: 77 Seated P: 94* Supine  AA: 90* Supine PROM 97 Supine A: 100 P: 106 Seated P: 110* Supine  A: 120* Supine  A: 106*  Seated A: 112* P: 117* Seated A: 112* P: 116*  Knee extension A: -12 A: -20 Supine P: -6* Supine AA: -3* Supine AROM -10, PROM -6 Supine A: -7 P: -4  Supine  A: -5* Supine  A: -10* Supine  P: -14* Seated LAQ A: -15* Standing A: -7* Seated LAQ A: -12*   (Blank rows = not tested)   LOWER EXTREMITY Strength:   Deferred due to AROM impairments noted at evaluation Right eval Left eval Right 12/11/21 Left 12/11/21  Knee flexion   3-/5  27.8# & 32.0# 36.7# & 34.5# 119.1% Rt  Knee extension    3-/5  96.9# & 85.7# 93.6# & 99.2# 105.6% Rt   (Blank rows = not tested)   MUSCLE LENGTH TESTING: 12/11/2021 [name] Test: Lt appeared 5* from neutral Hamstring length: Lt appeared 30* from neutral  GAIT: 12/18/2021: Pt ambulates in clinic without AD including ramp and curb with no losses of balance or instability 12/11/2021: Pt ambulates without AD x 150' with reduced Lt knee extension in stance, no losses of balance or instability 11/23/2021: Pt ambulates with cane x 230' including ramp and curb with no losses of balance or knee instability 11/13/2021:  pt amb with RW with LLE stiff leg with knee flexed in stance & minimal increase in flexion for swing. 11/10/2021: Edilberto is using a wheeled walker post-TKA   TODAY'S TREATMENT: 12/25/2021 TherEx: -Recumbent bike Seat 10 for 8 minutes - 6 min level 4, 1 min level 6, 1 min level 4 -A-P rocker board with cues for knee flexion anterior direction and knee extension posterior direction -M-L rocker board with cues for knee extension in middle -A-P and M-L rocking on BOSU flat side up with same cues for knee ext as pt requested how to work on activity outside of PT. BOSU at gym near cage for BUE support would enable. Pt verbalized understanding.  -lateral step up on BOSU round side up with RLE hip flexion to 90* with LLE terminal knee ext -LLE SLS on BOSU round side up reaching RLE ant & post 10 reps, pt reports some increased knee pain/pulling -step up, SLS knee ext & step down BOSU round side up BUE support 8 reps -slider with vector reaching anterolateral, lateral, posterolateral x 5 ea. bil. with verbal and visual demo for knee bend and stance WB -standing against counter Lt knee extension with red band resistance, cues for knee extension and controlled eccentric  12/20/2021 TherEx: -Recumbent bike Seat 10 for 8 minutes - 6 min level 4, 1 min level 6, 1 min level 4 -Standing gastroc stretch at step 2 x 30s -step up, SLS knee ext & step down BOSU  round side up BUE support 10 reps 1st set - LLE SLS on BOSU round side up reaching RLE ant & post 10 reps -lateral step up on BOSU round side up with RLE hip flexion to 90* with LLE terminal knee ext -prone knee flexion 5# 10 reps 2 sets PT recommended returning to gym 1x/wk while in PT to trial exercises & bring questions back to PT. PT recommended alternating between machines that work knee & other machines like UE or trunk to enable recovery of muscles / less fatigue with repetitive knee issues. Pt & wife verbalized understanding.   Manual: -Lt knee extension belted grade 3 mobs  in standing gastroc stretch -prone manual knee ext stretch with soft tissue manual stretch progressed to include active quad set reaching toes towards floor  12/13/2021 TherEx: -Recumbent bike Seat 10 for 8 minutes - 3 min level 2, 1 min level 4, 1 min level 2, 1 min level 4, 2 min level 2 -Standing hip flexor stretch at counter x 30s, noted increased low back arthritic pain -Standing hip flexor stretch at doorway to neutral upright posture with single UE upward reach alternating 2 x 2 deep breaths, BUE upward reach x 2 deep breaths -Standing gastroc stretch at step 2 x 30s -Standing hamstring stretch at step 2 x 30s  Therapeutic Activity -Pt ascend and descend curb and ramp without AD, no noted losses of balance or instability  Manual: -Lt knee extension belted grade 3 mobs in standing 2" gastroc stretch with ball toss x 1 min -Lt knee extension belted grade 3 mobs in terminal stance position x 30s  Neuro Re-ed: -Lt stance with Rt foot on ball A-P, M-L, CW and CCW circles x 10 ea. direction, cueing for upright posture and pt needed occasional UE touch    PATIENT EDUCATION:  Education details: Reviewed exam findings and HEP on vaso Person educated: Patient and Spouse Education method: Explanation, Demonstration, Tactile cues, Verbal cues, and Handouts Education comprehension: verbalized understanding,  returned demonstration, verbal cues required, tactile cues required, and needs further education     HOME EXERCISE PROGRAM: Access Code: [email or web address] URL: [email or web address] Date: 11/13/2021 Prepared by: Jamey Reas  Exercises - Supine Quadricep Sets  - 3-5 x daily - 7 x weekly - 2-3 sets - 10 reps - 5 second hold - Supine Heel Slide with Strap  - 2-3 x daily - 7 x weekly - 2-3 sets - 10 reps - 5 seconds hold - Supine Straight Leg Raises  - 2-3 x daily - 7 x weekly - 2-3 sets - 10 reps - 5 seconds hold - Seated Knee Flexion AAROM  - 3-5 x daily - 7 x weekly - 1 sets - 1 reps - 3 minutes hold - Seated Knee Extension Stretch with Chair  - 1 x daily - 7 x weekly - 1 sets - 1 reps - 3-5 minutes hold - Seated Knee Flexion Extension AROM   - 2-3 x daily - 7 x weekly - 2-3 sets - 10 reps - 5 seconds hold - Seated Quad Set  - 2-3 x daily - 7 x weekly - 2-3 sets - 10 reps - 5 seconds hold - Standing Lumbar Extension at Wall - Forearms  - 5 x daily - 7 x weekly - 1 sets - 5 reps - 3 seconds hold   ASSESSMENT:   CLINICAL IMPRESSION: He is continuing to progress with exercises that prioritize balance, eccentric strengthening, and knee extension with weight bearing activity. Continued emphasis on full active knee extension during gait and activity would be beneficial. He is likely to meet all LTGs by the end of current POC except he continues to lack full active knee ext. His AROM ext is better & close to goal in standing & supine but hamstring tightness continues to limit seated extension.    OBJECTIVE IMPAIRMENTS Abnormal gait, decreased activity tolerance, decreased balance, decreased endurance, decreased knowledge of condition, difficulty walking, decreased ROM, decreased strength, increased edema, obesity, and pain.    ACTIVITY LIMITATIONS carrying, lifting, bending, sitting, standing, squatting, sleeping, stairs, and locomotion level   PARTICIPATION LIMITATIONS: driving, community  activity, occupation, and church  PERSONAL FACTORS HTN, obesity, previous L ACL reconstruction are also affecting patient's functional outcome.    REHAB POTENTIAL: Good   CLINICAL DECISION MAKING: Stable/uncomplicated   EVALUATION COMPLEXITY: Low     GOALS: Goals reviewed with patient? Yes   SHORT TERM GOALS: Target date: 12/08/2021  Improve L knee AROM to 10 - 100 degrees Baseline: 20 - 77 degrees Goal status: MET 11/21/21   2.  Kendrell will be able to walk with a cane outside the home Baseline: Walker full-time Goal status: Met 12/01/2021     LONG TERM GOALS: Target date: 01/05/2022    Improve FOTO to 73 Baseline: 40 Goal status: On Going (50 12/01/2021)   2.  Arnett will report L knee pain consistently 0-3/10 on the NPRS Baseline: 5-8/10 Goal status: On Going 12/25/2021   3.  Improve L knee AROM to 5 - 110 degrees Baseline: 20 - 77 degrees Goal status: On Going (12 - 112 12/25/2021)   4.  Improve L quadriceps strength as assessed by MMT and FOTO scores Baseline: Deferred due to sciatic pain at evaluation Goal status: Improving 12/11/2021   5.  [place] will be able to walk without an assistive device at DC Baseline: Walker full-time Goal status: met 12/11/2021, met 12/18/2021 ramp and curb   6.  Brentlee will be independent with his HEP at DC Baseline: Started 11/10/2021 Goal status: On Going 12/25/2021     PLAN: PT FREQUENCY:  2-3X/week   PT DURATION: 8 weeks   PLANNED INTERVENTIONS: Therapeutic exercises, Therapeutic activity, Neuromuscular re-education, Balance training, Gait training, Patient/Family education, Self Care, Joint mobilization, Stair training, Cryotherapy, Vasopneumatic device, and Manual therapy   PLAN FOR NEXT SESSION: follow up if he went to gym & how he did. continue progressive quadriceps strengthening and manual to prioritize extension mobility, esp terminal extension in stance. Check ongoing LTGs and begin discussion of discharge planning. Discharge  planned for next week with single visit after he has been to both gyms that he plans to use.    Jana Hakim, Student-PT 12/25/2021, 3:44 PM  This entire session of physical therapy was performed under the direct supervision of PT signing evaluation /treatment. PT reviewed note and agrees.  Jamey Reas, PT, DPT 12/25/2021, 3:50 PM

## 2021-12-27 ENCOUNTER — Encounter: Payer: BC Managed Care – PPO | Admitting: Rehabilitative and Restorative Service Providers"

## 2021-12-27 ENCOUNTER — Encounter: Payer: Self-pay | Admitting: Rehabilitative and Restorative Service Providers"

## 2021-12-27 ENCOUNTER — Ambulatory Visit (INDEPENDENT_AMBULATORY_CARE_PROVIDER_SITE_OTHER): Payer: BC Managed Care – PPO | Admitting: Rehabilitative and Restorative Service Providers"

## 2021-12-27 DIAGNOSIS — M25562 Pain in left knee: Secondary | ICD-10-CM

## 2021-12-27 DIAGNOSIS — M6281 Muscle weakness (generalized): Secondary | ICD-10-CM | POA: Diagnosis not present

## 2021-12-27 DIAGNOSIS — R262 Difficulty in walking, not elsewhere classified: Secondary | ICD-10-CM

## 2021-12-27 DIAGNOSIS — M25662 Stiffness of left knee, not elsewhere classified: Secondary | ICD-10-CM | POA: Diagnosis not present

## 2021-12-27 DIAGNOSIS — R600 Localized edema: Secondary | ICD-10-CM

## 2021-12-27 NOTE — Therapy (Signed)
OUTPATIENT PHYSICAL THERAPY TREATMENT   Patient Name: Zachary [name]. MRN: [id number] DOB:Sep 18, 1955, 66 y.o., male Today's Date: 12/27/2021  PCP: Wendie Agreste, MD REFERRING PROVIDER: Mcarthur Rossetti, MD  END OF SESSION:   PT End of Session - 12/27/21 1437     Visit Number 16    Number of Visits 18    Date for PT Re-Evaluation 01/05/22    Progress Note Due on Visit 19    PT Start Time 1432    PT Stop Time 1513    PT Time Calculation (min) 41 min    Activity Tolerance Patient tolerated treatment well;No increased pain    Behavior During Therapy WFL for tasks assessed/performed              Past Medical History:  Diagnosis Date   Allergy    Arthritis    Asthma    Blood transfusion without reported diagnosis    as child   Constipation    Heart murmur    mild   Hyperlipidemia    slight elevation   Hypertension    Past Surgical History:  Procedure Laterality Date   APPENDECTOMY     age 23   COLONOSCOPY  2008   KNEE SURGERY Left 1982   pilonidal abcess     x 2 surgeries to correct this   TONSILLECTOMY     as a child   TOTAL KNEE ARTHROPLASTY Left 11/03/2021   Procedure: LEFT TOTAL KNEE ARTHROPLASTY;  Surgeon: Mcarthur Rossetti, MD;  Location: [facility];  Service: Orthopedics;  Laterality: Left;   Patient Active Problem List   Diagnosis Date Noted   Status post total left knee replacement 11/03/2021   Unilateral primary osteoarthritis, left knee 11/02/2021   OBESITY 05/09/2009   Essential hypertension 05/09/2009   HEMOCCULT POSITIVE STOOL 05/09/2009   HEART MURMUR, HX OF 05/09/2009    REFERRING DIAG: I34.742 (ICD-10-CM) - H/O total knee replacement, left   ONSET DATE: November 03, 2021  THERAPY DIAG:  Difficulty in walking, not elsewhere classified  Muscle weakness (generalized)  Localized edema  Stiffness of left knee, not elsewhere classified  Left knee pain, unspecified chronicity  Rationale for Evaluation and Treatment  Rehabilitation  PERTINENT HISTORY: HTN, obesity, previous L ACL reconstruction  PRECAUTIONS: Be aware of intermittent L sciatica  SUBJECTIVE: Eryx notes overall progress since the last time I saw him.  Pain meds as needed, not everyday.    PAIN:  Are you having pain? Yes: NPRS scale: 0-4/10 today Pain location: L knee Pain description: Achy, dull, stiff Aggravating factors: Prolonged postures and prolonged WB Relieving factors: Change of position, pain meds, ice, elevation  OBJECTIVE: (objective measures completed at initial evaluation unless otherwise dated)   DIAGNOSTIC FINDINGS: 11/03/21  Status post interval left total knee arthroplasty. The hardware is well positioned. There is no evidence of acute fracture or dislocation. Anterior skin staples are present. There is gas in the joint and the anterior soft tissues   PATIENT SURVEYS:  FOTO 12/27/2021 61 (Goal 73) FOTO 12/01/2021 50 (Goal 73) FOTO   11/10/2021  40 (risk adjusted 27) Goal 73  in 16 visits            SENSATION: [name] notes improving L sciatica with prolonged sitting   EDEMA:  Noted and not measured     LOWER EXTREMITY ROM:   ROM (A: AROM, P: PROM) Right eval Left eval Left 11/13/21 Left 11/15/21 Left 11/16/21 Left 11/21/21 Left 11/28/21 Right 12/01/21 Left 12/01/21  Left 12/11/21 Left 12/18/21 Left 12/25/21 Left 12/27/2021  Knee flexion A: 120 A: 77 Seated P: 94* Supine  AA: 90* Supine PROM 97 Supine A: 100 P: 106 Seated P: 110* Supine  A: 120* Supine  A: 106*  Seated A: 112* P: 117* Seated A: 112* P: 116* Supine Active 115 degrees  Knee extension A: -12 A: -20 Supine P: -6* Supine AA: -3* Supine AROM -10, PROM -6 Supine A: -7 P: -4  Supine  A: -5* Supine  A: -10* Supine  P: -14* Seated LAQ A: -15* Standing A: -7* Seated LAQ A: -12* Supine 4 degrees tighter than the uninvolved R   (Blank rows = not tested)   LOWER EXTREMITY Strength:   Deferred due to AROM impairments noted at evaluation  Right eval Left eval Right 12/11/21 Left 12/11/21  Knee flexion   3-/5  27.8# & 32.0# 36.7# & 34.5# 119.1% Rt  Knee extension   3-/5  96.9# & 85.7# 93.6# & 99.2# 105.6% Rt   (Blank rows = not tested)   MUSCLE LENGTH TESTING: 12/11/2021 [name] Test: Lt appeared 5* from neutral Hamstring length: Lt appeared 30* from neutral  GAIT: 12/18/2021: Pt ambulates in clinic without AD including ramp and curb with no losses of balance or instability 12/11/2021: Pt ambulates without AD x 150' with reduced Lt knee extension in stance, no losses of balance or instability 11/23/2021: Pt ambulates with cane x 230' including ramp and curb with no losses of balance or knee instability 11/13/2021:  pt amb with RW with LLE stiff leg with knee flexed in stance & minimal increase in flexion for swing. 11/10/2021: Landen is using a wheeled walker post-TKA   TODAY'S TREATMENT: 12/27/2021 Recumbent Bike Seat 11 for 8 minutes Quadriceps sets towel roll under L heel 2 sets of 10 for 5 seconds (toes back, press knees down and tighten thighs) Seated knee extension machine 90-40 degrees 25# 2 sets of 15 25# slow eccentrics  Functional Activities for stairs and sit to stand: Leg Press 100# L leg only slow eccentrics 15X  Lateral step-down 4 and 6 inch step 10 and 3 sets of 10, respectively  Practical stair work in lobby - 4 flights with focus on slow eccentrics and reciprocal steps   12/25/2021 TherEx: -Recumbent bike Seat 10 for 8 minutes - 6 min level 4, 1 min level 6, 1 min level 4 -A-P rocker board with cues for knee flexion anterior direction and knee extension posterior direction -M-L rocker board with cues for knee extension in middle -A-P and M-L rocking on BOSU flat side up with same cues for knee ext as pt requested how to work on activity outside of PT. BOSU at gym near cage for BUE support would enable. Pt verbalized understanding.  -lateral step up on BOSU round side up with RLE hip flexion to 90* with  LLE terminal knee ext -LLE SLS on BOSU round side up reaching RLE ant & post 10 reps, pt reports some increased knee pain/pulling -step up, SLS knee ext & step down BOSU round side up BUE support 8 reps -slider with vector reaching anterolateral, lateral, posterolateral x 5 ea. bil. with verbal and visual demo for knee bend and stance WB -standing against counter Lt knee extension with red band resistance, cues for knee extension and controlled eccentric   12/20/2021 TherEx: -Recumbent bike Seat 10 for 8 minutes - 6 min level 4, 1 min level 6, 1 min level 4 -Standing gastroc stretch at step 2  x 30s -step up, SLS knee ext & step down BOSU round side up BUE support 10 reps 1st set - LLE SLS on BOSU round side up reaching RLE ant & post 10 reps -lateral step up on BOSU round side up with RLE hip flexion to 90* with LLE terminal knee ext -prone knee flexion 5# 10 reps 2 sets PT recommended returning to gym 1x/wk while in PT to trial exercises & bring questions back to PT. PT recommended alternating between machines that work knee & other machines like UE or trunk to enable recovery of muscles / less fatigue with repetitive knee issues. Pt & wife verbalized understanding.   Manual: -Lt knee extension belted grade 3 mobs in standing gastroc stretch -prone manual knee ext stretch with soft tissue manual stretch progressed to include active quad set reaching toes towards floor  PATIENT EDUCATION:  Education details: Reviewed exam findings and HEP on vaso Person educated: Patient and Spouse Education method: Explanation, Demonstration, Tactile cues, Verbal cues, and Handouts Education comprehension: verbalized understanding, returned demonstration, verbal cues required, tactile cues required, and needs further education     HOME EXERCISE PROGRAM: Access Code: [email or web address] URL: [email or web address] Date: 11/13/2021 Prepared by: Jamey Reas  Exercises - Supine Quadricep Sets  -  3-5 x daily - 7 x weekly - 2-3 sets - 10 reps - 5 second hold - Supine Heel Slide with Strap  - 2-3 x daily - 7 x weekly - 2-3 sets - 10 reps - 5 seconds hold - Supine Straight Leg Raises  - 2-3 x daily - 7 x weekly - 2-3 sets - 10 reps - 5 seconds hold - Seated Knee Flexion AAROM  - 3-5 x daily - 7 x weekly - 1 sets - 1 reps - 3 minutes hold - Seated Knee Extension Stretch with Chair  - 1 x daily - 7 x weekly - 1 sets - 1 reps - 3-5 minutes hold - Seated Knee Flexion Extension AROM   - 2-3 x daily - 7 x weekly - 2-3 sets - 10 reps - 5 seconds hold - Seated Quad Set  - 2-3 x daily - 7 x weekly - 2-3 sets - 10 reps - 5 seconds hold - Standing Lumbar Extension at Wall - Forearms  - 5 x daily - 7 x weekly - 1 sets - 5 reps - 3 seconds hold   ASSESSMENT:   CLINICAL IMPRESSION: Knee extension AROM is < 5 degrees off his uninvolved R side.  Strength and comfort with stairs and other ADLs is improving week to week.  Consider progress note next visit to assess Kamdon's readiness for independent PT.   OBJECTIVE IMPAIRMENTS Abnormal gait, decreased activity tolerance, decreased balance, decreased endurance, decreased knowledge of condition, difficulty walking, decreased ROM, decreased strength, increased edema, obesity, and pain.    ACTIVITY LIMITATIONS carrying, lifting, bending, sitting, standing, squatting, sleeping, stairs, and locomotion level   PARTICIPATION LIMITATIONS: driving, community activity, occupation, and church   PERSONAL FACTORS HTN, obesity, previous L ACL reconstruction are also affecting patient's functional outcome.    REHAB POTENTIAL: Good   CLINICAL DECISION MAKING: Stable/uncomplicated   EVALUATION COMPLEXITY: Low     GOALS: Goals reviewed with patient? Yes   SHORT TERM GOALS: Target date: 12/08/2021  Improve L knee AROM to 10 - 100 degrees Baseline: 20 - 77 degrees Goal status: MET 11/21/21   2.  Nachman will be able to walk with a cane outside the home Baseline:  Gilford Rile full-time Goal  status: Met 12/01/2021     LONG TERM GOALS: Target date: 01/05/2022    Improve FOTO to 73 Baseline: 40 Goal status: On Going (61 12/27/2021)   2.  Manual will report L knee pain consistently 0-3/10 on the NPRS Baseline: 5-8/10 Goal status: On Going 12/27/2021   3.  Improve L knee AROM to 5 - 110 degrees Baseline: 20 - 77 degrees Goal status: Met when comparing L vs R (5 - 115 12/27/2021)   4.  Improve L quadriceps strength as assessed by MMT and FOTO scores Baseline: Deferred due to sciatic pain at evaluation Goal status: Improving 12/27/2021   5.  Dayveon will be able to walk without an assistive device at DC Baseline: Walker full-time Goal status: met 12/11/2021, met 12/18/2021 ramp and curb   6.  Choice will be independent with his HEP at DC Baseline: Started 11/10/2021 Goal status: On Going 12/27/2021     PLAN: PT FREQUENCY:  2-3X/week   PT DURATION: 8 weeks   PLANNED INTERVENTIONS: Therapeutic exercises, Therapeutic activity, Neuromuscular re-education, Balance training, Gait training, Patient/Family education, Self Care, Joint mobilization, Stair training, Cryotherapy, Vasopneumatic device, and Manual therapy   PLAN FOR NEXT SESSION: Progress quadriceps strengthening and knee extension AROM.  Progress note and possible DC.   Farley Ly, PT 12/27/2021, 5:19 PM

## 2021-12-29 ENCOUNTER — Encounter: Payer: BC Managed Care – PPO | Admitting: Rehabilitative and Restorative Service Providers"

## 2022-01-02 ENCOUNTER — Encounter: Payer: BC Managed Care – PPO | Admitting: Physical Therapy

## 2022-01-02 NOTE — Therapy (Signed)
OUTPATIENT PHYSICAL THERAPY TREATMENT   Patient Name: Zachary [name]. MRN: [id number] DOB:May 15, 1955, 66 y.o., male Today's Date: 01/02/2022  PCP: Zachary Agreste, MD REFERRING PROVIDER: Mcarthur Rossetti, MD  END OF SESSION:      Past Medical History:  Diagnosis Date   Allergy    Arthritis    Asthma    Blood transfusion without reported diagnosis    as child   Constipation    Heart murmur    mild   Hyperlipidemia    slight elevation   Hypertension    Past Surgical History:  Procedure Laterality Date   APPENDECTOMY     age 82   COLONOSCOPY  2008   KNEE SURGERY Left 1982   pilonidal abcess     x 2 surgeries to correct this   TONSILLECTOMY     as a child   TOTAL KNEE ARTHROPLASTY Left 11/03/2021   Procedure: LEFT TOTAL KNEE ARTHROPLASTY;  Surgeon: Zachary Rossetti, MD;  Location: [facility];  Service: Orthopedics;  Laterality: Left;   Patient Active Problem List   Diagnosis Date Noted   Status post total left knee replacement 11/03/2021   Unilateral primary osteoarthritis, left knee 11/02/2021   OBESITY 05/09/2009   Essential hypertension 05/09/2009   HEMOCCULT POSITIVE STOOL 05/09/2009   HEART MURMUR, HX OF 05/09/2009    REFERRING DIAG: V42.595 (ICD-10-CM) - H/O total knee replacement, left   ONSET DATE: November 03, 2021  THERAPY DIAG:  No diagnosis found.  Rationale for Evaluation and Treatment Rehabilitation  PERTINENT HISTORY: HTN, obesity, previous L ACL reconstruction  PRECAUTIONS: Be aware of intermittent L sciatica  SUBJECTIVE: ***Zachary [name] notes overall progress since the last time I saw him.  Pain meds as needed, not everyday.    PAIN:  Are you having pain? Yes: NPRS scale: ***0-4/10 today Pain location: L knee Pain description: Achy, dull, stiff Aggravating factors: Prolonged postures and prolonged WB Relieving factors: Change of position, pain meds, ice, elevation  OBJECTIVE: (objective measures completed at initial evaluation  unless otherwise dated)   DIAGNOSTIC FINDINGS: 11/03/21  Status post interval left total knee arthroplasty. The hardware is well positioned. There is no evidence of acute fracture or dislocation. Anterior skin staples are present. There is gas in the joint and the anterior soft tissues   PATIENT SURVEYS:  FOTO 12/27/2021 61 (Goal 73) FOTO 12/01/2021 50 (Goal 73) FOTO   11/10/2021  40 (risk adjusted 27) Goal 73  in 16 visits            SENSATION: Zachary [name] notes improving L sciatica with prolonged sitting   EDEMA:  Noted and not measured     LOWER EXTREMITY ROM:   ROM (A: AROM, P: PROM) Right eval Left eval Left 11/13/21 Left 11/15/21 Left 11/16/21 Left 11/21/21 Left 11/28/21 Right 12/01/21 Left 12/01/21 Left 12/11/21 Left 12/18/21 Left 12/25/21 Left 12/27/2021  Knee flexion A: 120 A: 77 Seated P: 94* Supine  AA: 90* Supine PROM 97 Supine A: 100 P: 106 Seated P: 110* Supine  A: 120* Supine  A: 106*  Seated A: 112* P: 117* Seated A: 112* P: 116* Supine Active 115 degrees  Knee extension A: -12 A: -20 Supine P: -6* Supine AA: -3* Supine AROM -10, PROM -6 Supine A: -7 P: -4  Supine  A: -5* Supine  A: -10* Supine  P: -14* Seated LAQ A: -15* Standing A: -7* Seated LAQ A: -12* Supine 4 degrees tighter than the uninvolved R   (Blank rows =  not tested)   LOWER EXTREMITY Strength:   Deferred due to AROM impairments noted at evaluation Right eval Left eval Right 12/11/21 Left 12/11/21  Knee flexion   3-/5  27.8# & 32.0# 36.7# & 34.5# 119.1% Rt  Knee extension   3-/5  96.9# & 85.7# 93.6# & 99.2# 105.6% Rt   (Blank rows = not tested)   MUSCLE LENGTH TESTING: 12/11/2021 Zachary [name] Test: Lt appeared 5* from neutral Hamstring length: Lt appeared 30* from neutral  GAIT: 12/18/2021: Pt ambulates in clinic without AD including ramp and curb with no losses of balance or instability 12/11/2021: Pt ambulates without AD x 150' with reduced Lt knee extension in stance, no losses of balance or  instability 11/23/2021: Pt ambulates with cane x 230' including ramp and curb with no losses of balance or knee instability 11/13/2021:  pt amb with RW with LLE stiff leg with knee flexed in stance & minimal increase in flexion for swing. 11/10/2021: Zachary [name] is using a wheeled walker post-TKA   TODAY'S TREATMENT: 01/02/2022 ***  12/27/2021 Recumbent Bike Seat 11 for 8 minutes Quadriceps sets towel roll under L heel 2 sets of 10 for 5 seconds (toes back, press knees down and tighten thighs) Seated knee extension machine 90-40 degrees 25# 2 sets of 15 25# slow eccentrics  Functional Activities for stairs and sit to stand: Leg Press 100# L leg only slow eccentrics 15X  Lateral step-down 4 and 6 inch step 10 and 3 sets of 10, respectively  Practical stair work in lobby - 4 flights with focus on slow eccentrics and reciprocal steps   12/25/2021 TherEx: -Recumbent bike Seat 10 for 8 minutes - 6 min level 4, 1 min level 6, 1 min level 4 -A-P rocker board with cues for knee flexion anterior direction and knee extension posterior direction -M-L rocker board with cues for knee extension in middle -A-P and M-L rocking on BOSU flat side up with same cues for knee ext as pt requested how to work on activity outside of PT. BOSU at gym near cage for BUE support would enable. Pt verbalized understanding.  -lateral step up on BOSU round side up with RLE hip flexion to 90* with LLE terminal knee ext -LLE SLS on BOSU round side up reaching RLE ant & post 10 reps, pt reports some increased knee pain/pulling -step up, SLS knee ext & step down BOSU round side up BUE support 8 reps -slider with vector reaching anterolateral, lateral, posterolateral x 5 ea. bil. with verbal and visual demo for knee bend and stance WB -standing against counter Lt knee extension with red band resistance, cues for knee extension and controlled eccentric   12/20/2021 TherEx: -Recumbent bike Seat 10 for 8 minutes - 6 min level 4, 1 min  level 6, 1 min level 4 -Standing gastroc stretch at step 2 x 30s -step up, SLS knee ext & step down BOSU round side up BUE support 10 reps 1st set - LLE SLS on BOSU round side up reaching RLE ant & post 10 reps -lateral step up on BOSU round side up with RLE hip flexion to 90* with LLE terminal knee ext -prone knee flexion 5# 10 reps 2 sets PT recommended returning to gym 1x/wk while in PT to trial exercises & bring questions back to PT. PT recommended alternating between machines that work knee & other machines like UE or trunk to enable recovery of muscles / less fatigue with repetitive knee issues. Pt & wife verbalized understanding.  Manual: -Lt knee extension belted grade 3 mobs in standing gastroc stretch -prone manual knee ext stretch with soft tissue manual stretch progressed to include active quad set reaching toes towards floor  PATIENT EDUCATION:  Education details: Reviewed exam findings and HEP on vaso Person educated: Patient and Spouse Education method: Explanation, Demonstration, Tactile cues, Verbal cues, and Handouts Education comprehension: verbalized understanding, returned demonstration, verbal cues required, tactile cues required, and needs further education     HOME EXERCISE PROGRAM: Access Code: [email or web address] URL: [email or web address] Date: 11/13/2021 Prepared by: Jamey Reas  Exercises - Supine Quadricep Sets  - 3-5 x daily - 7 x weekly - 2-3 sets - 10 reps - 5 second hold - Supine Heel Slide with Strap  - 2-3 x daily - 7 x weekly - 2-3 sets - 10 reps - 5 seconds hold - Supine Straight Leg Raises  - 2-3 x daily - 7 x weekly - 2-3 sets - 10 reps - 5 seconds hold - Seated Knee Flexion AAROM  - 3-5 x daily - 7 x weekly - 1 sets - 1 reps - 3 minutes hold - Seated Knee Extension Stretch with Chair  - 1 x daily - 7 x weekly - 1 sets - 1 reps - 3-5 minutes hold - Seated Knee Flexion Extension AROM   - 2-3 x daily - 7 x weekly - 2-3 sets - 10 reps - 5  seconds hold - Seated Quad Set  - 2-3 x daily - 7 x weekly - 2-3 sets - 10 reps - 5 seconds hold - Standing Lumbar Extension at Wall - Forearms  - 5 x daily - 7 x weekly - 1 sets - 5 reps - 3 seconds hold   ASSESSMENT:   CLINICAL IMPRESSION: ***Knee extension AROM is < 5 degrees off his uninvolved R side.  Strength and comfort with stairs and other ADLs is improving week to week.  Consider progress note next visit to assess Amardeep's readiness for independent PT.   OBJECTIVE IMPAIRMENTS Abnormal gait, decreased activity tolerance, decreased balance, decreased endurance, decreased knowledge of condition, difficulty walking, decreased ROM, decreased strength, increased edema, obesity, and pain.    ACTIVITY LIMITATIONS carrying, lifting, bending, sitting, standing, squatting, sleeping, stairs, and locomotion level   PARTICIPATION LIMITATIONS: driving, community activity, occupation, and church   PERSONAL FACTORS HTN, obesity, previous L ACL reconstruction are also affecting patient's functional outcome.    REHAB POTENTIAL: Good   CLINICAL DECISION MAKING: Stable/uncomplicated   EVALUATION COMPLEXITY: Low     GOALS: Goals reviewed with patient? Yes   SHORT TERM GOALS: Target date: 12/08/2021  Improve L knee AROM to 10 - 100 degrees Baseline: 20 - 77 degrees Goal status: MET 11/21/21   2.  Kartier will be able to walk with a cane outside the home Baseline: Walker full-time Goal status: Met 12/01/2021     LONG TERM GOALS: Target date: 01/05/2022    Improve FOTO to 73 Baseline: 40 Goal status: On Going (61 12/27/2021)   2.  Aulden will report L knee pain consistently 0-3/10 on the NPRS Baseline: 5-8/10 Goal status: On Going 12/27/2021   3.  Improve L knee AROM to 5 - 110 degrees Baseline: 20 - 77 degrees Goal status: Met when comparing L vs R (5 - 115 12/27/2021)   4.  Improve L quadriceps strength as assessed by MMT and FOTO scores Baseline: Deferred due to sciatic pain at  evaluation Goal status: Improving 12/27/2021   5.  Mohamedamin will be  able to walk without an assistive device at DC Baseline: Walker full-time Goal status: met 12/11/2021, met 12/18/2021 ramp and curb   6.  Kainalu will be independent with his HEP at DC Baseline: Started 11/10/2021 Goal status: On Going 12/27/2021     PLAN: PT FREQUENCY:  2-3X/week   PT DURATION: 8 weeks   PLANNED INTERVENTIONS: Therapeutic exercises, Therapeutic activity, Neuromuscular re-education, Balance training, Gait training, Patient/Family education, Self Care, Joint mobilization, Stair training, Cryotherapy, Vasopneumatic device, and Manual therapy   PLAN FOR NEXT SESSION: ***Progress quadriceps strengthening and knee extension AROM.  Progress note and possible DC.   Jana Hakim, Student-PT 01/02/2022, 5:40 PM

## 2022-01-03 ENCOUNTER — Encounter: Payer: Self-pay | Admitting: Physical Therapy

## 2022-01-03 ENCOUNTER — Ambulatory Visit (INDEPENDENT_AMBULATORY_CARE_PROVIDER_SITE_OTHER): Payer: BC Managed Care – PPO | Admitting: Physical Therapy

## 2022-01-03 DIAGNOSIS — R262 Difficulty in walking, not elsewhere classified: Secondary | ICD-10-CM | POA: Diagnosis not present

## 2022-01-03 DIAGNOSIS — M25662 Stiffness of left knee, not elsewhere classified: Secondary | ICD-10-CM

## 2022-01-03 DIAGNOSIS — M25562 Pain in left knee: Secondary | ICD-10-CM

## 2022-01-03 DIAGNOSIS — M6281 Muscle weakness (generalized): Secondary | ICD-10-CM

## 2022-01-03 DIAGNOSIS — R600 Localized edema: Secondary | ICD-10-CM

## 2022-01-07 ENCOUNTER — Other Ambulatory Visit: Payer: Self-pay | Admitting: Family Medicine

## 2022-01-07 DIAGNOSIS — I10 Essential (primary) hypertension: Secondary | ICD-10-CM

## 2022-03-26 ENCOUNTER — Encounter: Payer: Self-pay | Admitting: Orthopaedic Surgery

## 2022-03-26 ENCOUNTER — Ambulatory Visit (INDEPENDENT_AMBULATORY_CARE_PROVIDER_SITE_OTHER): Payer: BC Managed Care – PPO | Admitting: Orthopaedic Surgery

## 2022-03-26 ENCOUNTER — Ambulatory Visit (INDEPENDENT_AMBULATORY_CARE_PROVIDER_SITE_OTHER): Payer: BC Managed Care – PPO

## 2022-03-26 DIAGNOSIS — Z96652 Presence of left artificial knee joint: Secondary | ICD-10-CM

## 2022-03-26 NOTE — Progress Notes (Signed)
The patient is now almost 5 months status post a left total knee arthroplasty.  He says now he can even stand on that leg on his own where he could not before.  He is very satisfied.  He is an active 66 year old gentleman.  On exam he still has some slight varus malalignment of both his knees.  He has full range of motion of his left operative knee and it feels stable.  There is no swelling.  2 views of the left knee show well-seated total knee arthroplasty with no complicating features.  He will continue to increase his activities as comfort allows.  I will see him back in 6 months for final visit unless there are issues before then.  At that visit I would actually like an AP and lateral of his left knee but with him laying supine and not standing.

## 2022-04-03 ENCOUNTER — Other Ambulatory Visit: Payer: Self-pay | Admitting: Family Medicine

## 2022-04-03 DIAGNOSIS — I10 Essential (primary) hypertension: Secondary | ICD-10-CM

## 2022-04-04 ENCOUNTER — Telehealth (INDEPENDENT_AMBULATORY_CARE_PROVIDER_SITE_OTHER): Payer: BC Managed Care – PPO | Admitting: Family Medicine

## 2022-04-04 ENCOUNTER — Encounter: Payer: Self-pay | Admitting: Family Medicine

## 2022-04-04 DIAGNOSIS — R7303 Prediabetes: Secondary | ICD-10-CM

## 2022-04-04 DIAGNOSIS — E785 Hyperlipidemia, unspecified: Secondary | ICD-10-CM | POA: Diagnosis not present

## 2022-04-04 DIAGNOSIS — I10 Essential (primary) hypertension: Secondary | ICD-10-CM | POA: Diagnosis not present

## 2022-04-04 MED ORDER — LOSARTAN POTASSIUM-HCTZ 100-25 MG PO TABS: 1.0000 | ORAL_TABLET | Freq: Every day | ORAL | 0 refills | Status: DC

## 2022-04-04 MED ORDER — DOXAZOSIN MESYLATE 4 MG PO TABS: 4.0000 mg | ORAL_TABLET | Freq: Every day | ORAL | 0 refills | Status: DC

## 2022-04-04 MED ORDER — METOPROLOL SUCCINATE ER 100 MG PO TB24: ORAL_TABLET | ORAL | 0 refills | Status: DC

## 2022-04-04 MED ORDER — ATORVASTATIN CALCIUM 10 MG PO TABS: ORAL_TABLET | ORAL | 0 refills | Status: DC

## 2022-04-04 MED ORDER — AMLODIPINE BESYLATE 10 MG PO TABS: 10.0000 mg | ORAL_TABLET | Freq: Every day | ORAL | 0 refills | Status: DC

## 2022-04-04 NOTE — Patient Instructions (Addendum)
Recheck in 6 weeks - will be checking fasting labs at that time. Restart lipitor once per day. Glad to hear that the knee is better and you are more active. No med changes for now.  Take care!

## 2022-04-04 NOTE — Progress Notes (Signed)
Virtual Visit via Video Note  I connected with Zachary [name]. on 04/04/22 at 10:26 AM by a video enabled telemedicine application and verified that I am speaking with the correct person using two identifiers.  Patient location: work, in private office.  My location: office - [place].    I discussed the limitations, risks, security and privacy concerns of performing an evaluation and management service by telephone and the availability of in person appointments. I also discussed with the patient that there may be a patient responsible charge related to this service. The patient expressed understanding and agreed to proceed, consent obtained  Chief complaint:  Chief Complaint  Patient presents with   Hyperlipidemia   Hypertension    History of Present Illness: Zachary [name]. is a 66 y.o. male Visit for chronic medication review, I last evaluated him in September 2022.  Discussed techniques for remembering medication at that time, and 3-week follow-up recommended for reassessment of blood pressure.  Denies barriers to care other than distance to office - works.   Hypertension: Treated with doxazosin 4 mg at night, Toprol-XL 100 mg daily, losartan/HCTZ 100/25 mg daily, amlodipine 10 mg at night.  Left total knee replacement in July, improving, recent Ortho note reviewed. Has been increasing activity - in gym 3 days per week - low impact - elliptical, rowing, etc.  Home readings: 130-135/70-85, elevated when nervous with surgery.  Constitutional: Negative for fatigue and unexpected weight change.  Eyes: Negative for visual disturbance.  Respiratory: Negative for cough, chest tightness and shortness of breath.   Cardiovascular: Negative for chest pain, palpitations and leg swelling.  Gastrointestinal: Negative for abdominal pain and blood in stool.  Neurological: Negative for dizziness,  light-headedness with sinus congestion - rare.  no headaches.     BP Readings from  Last 3 Encounters:  11/05/21 124/69  10/25/21 (!) 151/85  06/23/21 (!) 156/88   Lab Results  Component Value Date   CREATININE 0.73 11/04/2021   Hyperlipidemia: With history of elevated ASCVD risk score, recommended starting Lipitor 10 mg daily at his September 2022 visit, unfortunately has not been seen in follow-up since that time.  Denies new arthralgias/myalgias with Lipitor. Taking daily.  Ran out of lipitor 2 months ago.  Lab Results  Component Value Date   CHOL 187 01/20/2021   HDL 40.70 01/20/2021   LDLCALC 130 (H) 01/20/2021   TRIG 79.0 01/20/2021   CHOLHDL 5 01/20/2021   Lab Results  Component Value Date   ALT 26 01/20/2021   AST 21 01/20/2021   ALKPHOS 69 01/20/2021   BILITOT 0.6 01/20/2021   Prediabetes: Weight significantly improved with cutting back on starches, and avoidance of sodas at his September 2022 visit.  327 pounds down to 279 pounds at that time.  Some slight increased weight earlier this year as below. Home readings around 120.  No blurry vision, nocturia up to once only. No frequency or change in thirst. Lab Results  Component Value Date   HGBA1C 6.0 01/20/2021   Wt Readings from Last 3 Encounters:  11/03/21 290 lb (131.5 kg)  10/25/21 290 lb (131.5 kg)  06/23/21 280 lb (127 kg)     Patient Active Problem List   Diagnosis Date Noted   Status post total left knee replacement 11/03/2021   Unilateral primary osteoarthritis, left knee 11/02/2021   OBESITY 05/09/2009   Essential hypertension 05/09/2009   HEMOCCULT POSITIVE STOOL 05/09/2009   HEART MURMUR, HX OF 05/09/2009   Past Medical  History:  Diagnosis Date   Allergy    Arthritis    Asthma    Blood transfusion without reported diagnosis    as child   Constipation    Heart murmur    mild   Hyperlipidemia    slight elevation   Hypertension    Past Surgical History:  Procedure Laterality Date   APPENDECTOMY     age 59   COLONOSCOPY  2008   KNEE SURGERY Left 1982    pilonidal abcess     x 2 surgeries to correct this   TONSILLECTOMY     as a child   TOTAL KNEE ARTHROPLASTY Left 11/03/2021   Procedure: LEFT TOTAL KNEE ARTHROPLASTY;  Surgeon: Mcarthur Rossetti, MD;  Location: [facility];  Service: Orthopedics;  Laterality: Left;   Allergies  Allergen Reactions   Penicillins Rash   Prior to Admission medications   Medication Sig Start Date End Date Taking? Authorizing Provider  amLODipine (NORVASC) 10 MG tablet Take 1 tablet by mouth once daily Patient taking differently: Take 10 mg by mouth at bedtime. 07/14/21  Yes Wendie Agreste, MD  aspirin 81 MG chewable tablet Chew 1 tablet (81 mg total) by mouth 2 (two) times daily. 11/05/21  Yes Mcarthur Rossetti, MD  atorvastatin (LIPITOR) 10 MG tablet TAKE 1 TABLET BY MOUTH ONCE DAILY. CAN START FEW DAYS PER WEEK INITIALLY, THEN UP TO DAILY IF TOLERATED 10/10/21  Yes Wendie Agreste, MD  doxazosin (CARDURA) 4 MG tablet Take 1 tablet by mouth once daily Patient taking differently: Take 4 mg by mouth at bedtime. 10/10/21  Yes Wendie Agreste, MD  ibuprofen (ADVIL) 200 MG tablet Take 400 mg by mouth every 6 (six) hours as needed for moderate pain.   Yes [provider]  losartan-hydrochlorothiazide (HYZAAR) 100-25 MG tablet Take 1 tablet by mouth once daily 01/08/22  Yes Wendie Agreste, MD  metoprolol succinate (TOPROL-XL) 100 MG 24 hr tablet TAKE 1 TABLET BY MOUTH ONCE DAILY. TAKE WITH OR IMMEDIATELY FOLLOWING A MEAL, PATIENT NEEDS TO SCHEDULE APPOINTMENT FOR FURTHER REFILLS 01/08/22  Yes Wendie Agreste, MD  ondansetron (ZOFRAN-ODT) 4 MG disintegrating tablet Take 1 tablet (4 mg total) by mouth every 8 (eight) hours as needed for nausea or vomiting. 11/10/21  Yes Mcarthur Rossetti, MD  oxymetazoline (VICKS SINEX SEVERE DECONGEST) 0.05 % nasal spray Place 1 spray into both nostrils 2 (two) times daily as needed for congestion.   Yes [provider]  acetaminophen (TYLENOL) 650 MG CR  tablet Take 1,300 mg by mouth every 8 (eight) hours as needed for pain. Patient not taking: Reported on 04/04/2022    [provider]  diclofenac Sodium (VOLTAREN) 1 % GEL Apply 1 Application topically 3 (three) times daily as needed (pain). Patient not taking: Reported on 04/04/2022    [provider]  HYDROcodone-acetaminophen (NORCO/VICODIN) 5-325 MG tablet Take 1 tablet by mouth every 6 (six) hours as needed for moderate pain. Patient not taking: Reported on 04/04/2022 11/16/21   Mcarthur Rossetti, MD  tiZANidine (ZANAFLEX) 4 MG tablet Take 1 tablet (4 mg total) by mouth every 6 (six) hours as needed for muscle spasms. Patient not taking: Reported on 04/04/2022 11/05/21   Mcarthur Rossetti, MD   Social History   Socioeconomic History   Marital status: Married    Spouse name: Not on file   Number of children: Not on file   Years of education: Not on file   Highest education  level: Not on file  Occupational History   Not on file  Tobacco Use   Smoking status: Former    Packs/day: 0.50    Years: 10.00    Total pack years: 5.00    Types: Cigarettes    Quit date: 02/09/1979    Years since quitting: 43.1   Smokeless tobacco: Never  Vaping Use   Vaping Use: Never used  Substance and Sexual Activity   Alcohol use: Yes    Alcohol/week: 0.0 standard drinks of alcohol    Comment: occ glass of wine    Drug use: Yes    Types: Marijuana   Sexual activity: Yes  Other Topics Concern   Not on file  Social History Narrative   Not on file   Social Determinants of Health   Financial Resource Strain: Not on file  Food Insecurity: Not on file  Transportation Needs: Not on file  Physical Activity: Not on file  Stress: Not on file  Social Connections: Not on file  Intimate Partner Violence: Not on file    Observations/Objective: There were no vitals filed for this visit. Nontoxic appearance on video, no distress.  Speaking in full sentences without  difficulty, appropriate responses.  No audible wheeze or stridor.  Equal facial movements, no weakness.  All questions answered with understanding plan expressed  Assessment and Plan: Prediabetes  Essential hypertension - Plan: amLODipine (NORVASC) 10 MG tablet, doxazosin (CARDURA) 4 MG tablet, losartan-hydrochlorothiazide (HYZAAR) 100-25 MG tablet, metoprolol succinate (TOPROL-XL) 100 MG 24 hr tablet  Hyperlipidemia, unspecified hyperlipidemia type - Plan: atorvastatin (LIPITOR) 10 MG tablet  Blood sugars overall stable as above as well as blood pressures.  Improvement in activity/exercise after knee surgery likely is helping hypertension, hyperlipidemia and prediabetes.  He has tolerated the Lipitor, will restart and in office visit in 6 weeks for labs at that time.  Creatinine noted from July.  No med changes for now.  RTC precautions  Follow Up Instructions:  6 weeks in office I discussed the assessment and treatment plan with the patient. The patient was provided an opportunity to ask questions and all were answered. The patient agreed with the plan and demonstrated an understanding of the instructions.   The patient was advised to call back or seek an in-person evaluation if the symptoms worsen or if the condition fails to improve as anticipated.   Wendie Agreste, MD

## 2022-05-18 ENCOUNTER — Ambulatory Visit (INDEPENDENT_AMBULATORY_CARE_PROVIDER_SITE_OTHER): Payer: BC Managed Care – PPO | Admitting: Family Medicine

## 2022-05-18 VITALS — BP 136/78 | HR 102 | Temp 98.0°F | Ht 73.0 in | Wt 296.2 lb

## 2022-05-18 DIAGNOSIS — Z23 Encounter for immunization: Secondary | ICD-10-CM

## 2022-05-18 DIAGNOSIS — I10 Essential (primary) hypertension: Secondary | ICD-10-CM

## 2022-05-18 DIAGNOSIS — R0981 Nasal congestion: Secondary | ICD-10-CM

## 2022-05-18 DIAGNOSIS — E785 Hyperlipidemia, unspecified: Secondary | ICD-10-CM

## 2022-05-18 DIAGNOSIS — R7303 Prediabetes: Secondary | ICD-10-CM | POA: Diagnosis not present

## 2022-05-18 LAB — COMPREHENSIVE METABOLIC PANEL
ALT: 25 U/L (ref 0–53)
AST: 23 U/L (ref 0–37)
Albumin: 4.6 g/dL (ref 3.5–5.2)
Alkaline Phosphatase: 69 U/L (ref 39–117)
BUN: 15 mg/dL (ref 6–23)
CO2: 28 mEq/L (ref 19–32)
Calcium: 10 mg/dL (ref 8.4–10.5)
Chloride: 100 mEq/L (ref 96–112)
Creatinine, Ser: 0.85 mg/dL (ref 0.40–1.50)
GFR: 91.02 mL/min (ref >60.00)
Glucose, Bld: 135 mg/dL — ABNORMAL HIGH (ref 70–99)
Potassium: 4 mEq/L (ref 3.5–5.1)
Sodium: 138 mEq/L (ref 135–145)
Total Bilirubin: 0.6 mg/dL (ref 0.2–1.2)
Total Protein: 8.4 g/dL — ABNORMAL HIGH (ref 6.0–8.3)

## 2022-05-18 LAB — LIPID PANEL
Cholesterol: 138 mg/dL (ref 0–200)
HDL: 43.7 mg/dL (ref >39.00)
LDL Cholesterol: 82 mg/dL (ref 0–99)
NonHDL: 94.79
Total CHOL/HDL Ratio: 3
Triglycerides: 62 mg/dL (ref 0.0–149.0)
VLDL: 12.4 mg/dL (ref 0.0–40.0)

## 2022-05-18 LAB — HEMOGLOBIN A1C: Hgb A1c MFr Bld: 6.3 % (ref 4.6–6.5)

## 2022-05-18 MED ORDER — METOPROLOL SUCCINATE ER 100 MG PO TB24
ORAL_TABLET | ORAL | 1 refills | Status: DC
Start: 2022-05-18 — End: 2022-12-14

## 2022-05-18 MED ORDER — FLUTICASONE PROPIONATE 50 MCG/ACT NA SUSP: 2.0000 | Freq: Every day | NASAL | 6 refills | Status: AC

## 2022-05-18 MED ORDER — ATORVASTATIN CALCIUM 10 MG PO TABS
ORAL_TABLET | ORAL | 0 refills | Status: DC
Start: 2022-05-18 — End: 2022-12-14

## 2022-05-18 MED ORDER — LOSARTAN POTASSIUM-HCTZ 100-25 MG PO TABS
1.0000 | ORAL_TABLET | Freq: Every day | ORAL | 1 refills | Status: DC
Start: 2022-05-18 — End: 2022-12-14

## 2022-05-18 MED ORDER — AMLODIPINE BESYLATE 10 MG PO TABS: 10.0000 mg | ORAL_TABLET | Freq: Every day | ORAL | 1 refills | Status: DC

## 2022-05-18 MED ORDER — DOXAZOSIN MESYLATE 4 MG PO TABS: 4.0000 mg | ORAL_TABLET | Freq: Every day | ORAL | 1 refills | Status: DC

## 2022-05-18 NOTE — Progress Notes (Signed)
Subjective:  Patient ID: Zachary [name]., male    DOB: February 02, 1956  Age: 67 y.o. MRN: [id number]  CC:  Chief Complaint  Patient presents with   Diabetes    Pt notes recently took some sudafed notes this am BG 147 fasting    Hypertension    Good in office today notes lower at home with monitor    HPI KALA GASSMANN. presents for  Follow-up, video visit December 6. Had been out of his cholesterol med for a few months at that time.  He was increasing his activity with exercise few days per week, prior left knee replacement in July 2023 that was improving.   Nasal congestion.  Some nasal congestion for past few months. Some PND. Clearing throat, no heartburn. no fever. Took sudafed yesterday. No recent allergy meds.  Has been using sinex nasal spray - past few weeks. Some rebound congestion.    Prediabetes: Treated with diet/exercise approach.  Weight did improve if previously with cutting back on starches and sodas.  Weight had been up to 327 pounds. Fasting glucose 147 today, usually around 120.  Low impact exercise 3 days per week - 36mn.   Lab Results  Component Value Date   HGBA1C 6.0 01/20/2021   Wt Readings from Last 3 Encounters:  05/18/22 296 lb 3.2 oz (134.4 kg)  11/03/21 290 lb (131.5 kg)  10/25/21 290 lb (131.5 kg)   Hypertension: Amlodipine 10 mg daily, doxazosin 4 mg daily, losartan hydrochlorothiazide 100/25 mg daily, Toprol-XL 100 mg daily. Home readings: lower at home usually. 140/85 highest, usually 130/70.  BP Readings from Last 3 Encounters:  05/18/22 136/78  11/05/21 124/69  10/25/21 (!) 151/85   Lab Results  Component Value Date   CREATININE 0.73 11/04/2021    Hyperlipidemia: Treated with Lipitor 10 mg daily.  No new myalgias/side effects. On daily meds since 04/04/22.  Fasting today.   Lab Results  Component Value Date   CHOL 187 01/20/2021   HDL 40.70 01/20/2021   LDLCALC 130 (H) 01/20/2021   TRIG 79.0 01/20/2021   CHOLHDL 5  01/20/2021   Lab Results  Component Value Date   ALT 26 01/20/2021   AST 21 01/20/2021   ALKPHOS 69 01/20/2021   BILITOT 0.6 01/20/2021   HM: Flu vaccine today.  Covid booster - recommended.  Shingrix - 2nd one recommended at pharmacy.  Pneumonia vaccine today.  Immunization History  Administered Date(s) Administered   Influenza, Seasonal, Injecte, Preservative Fre 05/24/2012   Influenza,inj,Quad PF,6+ Mos 02/21/2013, 03/20/2014, 01/20/2018, 04/17/2019, 05/02/2020, 01/20/2021, 01/20/2021   PFIZER(Purple Top)SARS-COV-2 Vaccination 07/15/2019, 08/09/2019   Tdap 03/25/2017   Zoster Recombinat (Shingrix) 05/02/2020     History Patient Active Problem List   Diagnosis Date Noted   Status post total left knee replacement 11/03/2021   Unilateral primary osteoarthritis, left knee 11/02/2021   OBESITY 05/09/2009   Essential hypertension 05/09/2009   HEMOCCULT POSITIVE STOOL 05/09/2009   HEART MURMUR, HX OF 05/09/2009   Past Medical History:  Diagnosis Date   Allergy    Arthritis    Asthma    Blood transfusion without reported diagnosis    as child   Constipation    Heart murmur    mild   Hyperlipidemia    slight elevation   Hypertension    Past Surgical History:  Procedure Laterality Date   APPENDECTOMY     age 67  COLONOSCOPY  2008   KNEE SURGERY Left 1982   pilonidal abcess  x 2 surgeries to correct this   TONSILLECTOMY     as a child   TOTAL KNEE ARTHROPLASTY Left 11/03/2021   Procedure: LEFT TOTAL KNEE ARTHROPLASTY;  Surgeon: Mcarthur Rossetti, MD;  Location: [facility];  Service: Orthopedics;  Laterality: Left;   Allergies  Allergen Reactions   Penicillins Rash   Prior to Admission medications   Medication Sig Start Date End Date Taking? Authorizing Provider  amLODipine (NORVASC) 10 MG tablet Take 1 tablet (10 mg total) by mouth daily. 04/04/22  Yes Wendie Agreste, MD  aspirin 81 MG chewable tablet Chew 1 tablet (81 mg total) by mouth 2 (two) times  daily. 11/05/21  Yes Mcarthur Rossetti, MD  atorvastatin (LIPITOR) 10 MG tablet TAKE 1 TABLET BY MOUTH ONCE DAILY. 04/04/22  Yes Wendie Agreste, MD  doxazosin (CARDURA) 4 MG tablet Take 1 tablet (4 mg total) by mouth at bedtime. 04/04/22  Yes Wendie Agreste, MD  ibuprofen (ADVIL) 200 MG tablet Take 400 mg by mouth every 6 (six) hours as needed for moderate pain.   Yes [provider]  losartan-hydrochlorothiazide (HYZAAR) 100-25 MG tablet Take 1 tablet by mouth daily. 04/04/22  Yes Wendie Agreste, MD  metoprolol succinate (TOPROL-XL) 100 MG 24 hr tablet TAKE 1 TABLET BY MOUTH ONCE DAILY. TAKE WITH OR IMMEDIATELY FOLLOWING A MEAL, PATIENT NEEDS TO SCHEDULE APPOINTMENT FOR FURTHER REFILLS 04/04/22  Yes Wendie Agreste, MD  ondansetron (ZOFRAN-ODT) 4 MG disintegrating tablet Take 1 tablet (4 mg total) by mouth every 8 (eight) hours as needed for nausea or vomiting. 11/10/21  Yes Mcarthur Rossetti, MD  oxymetazoline (VICKS SINEX SEVERE DECONGEST) 0.05 % nasal spray Place 1 spray into both nostrils 2 (two) times daily as needed for congestion.   Yes [provider]   Social History   Socioeconomic History   Marital status: Married    Spouse name: Not on file   Number of children: Not on file   Years of education: Not on file   Highest education level: Not on file  Occupational History   Not on file  Tobacco Use   Smoking status: Former    Packs/day: 0.50    Years: 10.00    Total pack years: 5.00    Types: Cigarettes    Quit date: 02/09/1979    Years since quitting: 43.2   Smokeless tobacco: Never  Vaping Use   Vaping Use: Never used  Substance and Sexual Activity   Alcohol use: Yes    Alcohol/week: 0.0 standard drinks of alcohol    Comment: occ glass of wine    Drug use: Yes    Types: Marijuana   Sexual activity: Yes  Other Topics Concern   Not on file  Social History Narrative   Not on file   Social Determinants of Health   Financial Resource  Strain: Not on file  Food Insecurity: Not on file  Transportation Needs: Not on file  Physical Activity: Not on file  Stress: Not on file  Social Connections: Not on file  Intimate Partner Violence: Not on file    Review of Systems  Constitutional:  Negative for fatigue and unexpected weight change.  Eyes:  Negative for visual disturbance.  Respiratory:  Negative for cough, chest tightness and shortness of breath.   Cardiovascular:  Negative for chest pain, palpitations and leg swelling.  Gastrointestinal:  Negative for abdominal pain and blood in stool.  Neurological:  Negative for dizziness, light-headedness and headaches.  Objective:   Vitals:   05/18/22 0848  BP: 136/78  Pulse: (!) 102  Temp: 98 F (36.7 C)  SpO2: 96%  Weight: 296 lb 3.2 oz (134.4 kg)  Height: '6\' 1"'$  (1.854 m)     Physical Exam Vitals reviewed.  Constitutional:      Appearance: He is well-developed.  HENT:     Head: Normocephalic and atraumatic.     Nose: Congestion present.     Comments: Turbinate edema, no sinus ttp.  Neck:     Vascular: No carotid bruit or JVD.  Cardiovascular:     Rate and Rhythm: Normal rate and regular rhythm.     Heart sounds: Normal heart sounds. No murmur heard. Pulmonary:     Effort: Pulmonary effort is normal.     Breath sounds: Normal breath sounds. No rales.  Musculoskeletal:     Right lower leg: No edema.     Left lower leg: No edema.  Skin:    General: Skin is warm and dry.  Neurological:     Mental Status: He is alert and oriented to person, place, and time.  Psychiatric:        Mood and Affect: Mood normal.      Assessment & Plan:  CIPRIANO MILLIKAN. is a 67 y.o. male . Nasal congestion - Plan: fluticasone (FLONASE) 50 MCG/ACT nasal spray  -Unfortunately may have component of rhinitis medicamentosa.  Avoidance of nasal decongestants discussed in the future.  Avoidance of Sudafed discussed with history of hypertension.  Start Flonase, recheck  next 2 weeks if not improving.  Discussed potential initial worsening of congestion off Synex.  May need prednisone or ENT eval, but with prediabetes will hold on prednisone for now.  RTC precautions  Needs flu shot - Plan: Flu Vaccine QUAD High Dose(Fluad)  Prediabetes - Plan: Hemoglobin A1c  -Elevated this morning, slight elevations previously.  Check A1c and adjust meds accordingly.  Continue to watch diet, exercise.  Hyperlipidemia, unspecified hyperlipidemia type - Plan: Comprehensive metabolic panel, Lipid panel, atorvastatin (LIPITOR) 10 MG tablet  -Back on Lipitor for at least 5 weeks.  Tolerating current dosing, check updated fasting labs.  Essential hypertension - Plan: metoprolol succinate (TOPROL-XL) 100 MG 24 hr tablet, losartan-hydrochlorothiazide (HYZAAR) 100-25 MG tablet, amLODipine (NORVASC) 10 MG tablet, doxazosin (CARDURA) 4 MG tablet  -Borderline control, handout given on blood pressure management.  Stop decongestants.  Check labs above.  RTC precautions  Need for pneumococcal vaccination - Plan: Pneumococcal conjugate vaccine 20-valent (Prevnar 20)   Meds ordered this encounter  Medications   fluticasone (FLONASE) 50 MCG/ACT nasal spray    Sig: Place 2 sprays into both nostrils daily.    Dispense:  16 g    Refill:  6   atorvastatin (LIPITOR) 10 MG tablet    Sig: TAKE 1 TABLET BY MOUTH ONCE DAILY.    Dispense:  90 tablet    Refill:  0   metoprolol succinate (TOPROL-XL) 100 MG 24 hr tablet    Sig: TAKE 1 TABLET BY MOUTH ONCE DAILY. TAKE WITH OR IMMEDIATELY FOLLOWING A MEAL    Dispense:  90 tablet    Refill:  1   losartan-hydrochlorothiazide (HYZAAR) 100-25 MG tablet    Sig: Take 1 tablet by mouth daily.    Dispense:  90 tablet    Refill:  1   amLODipine (NORVASC) 10 MG tablet    Sig: Take 1 tablet (10 mg total) by mouth daily.    Dispense:  90 tablet  Refill:  1   doxazosin (CARDURA) 4 MG tablet    Sig: Take 1 tablet (4 mg total) by mouth at bedtime.     Dispense:  90 tablet    Refill:  1   Patient Instructions  Stop sinex nasal spray - that may be a cause of something called rhinitis medicamentosa. Start flonase. Congestion may temporarily worsen due to prolonged use of Sinex. If not improved in next 2 weeks, follow up and we can discuss other options.   No other med changes today. See info about blood pressure below.   Managing Your Hypertension Hypertension, also called high blood pressure, is when the force of the blood pressing against the walls of the arteries is too strong. Arteries are blood vessels that carry blood from your heart throughout your body. Hypertension forces the heart to work harder to pump blood and may cause the arteries to become narrow or stiff. Understanding blood pressure readings A blood pressure reading includes a higher number over a lower number: The first, or top, number is called the systolic pressure. It is a measure of the pressure in your arteries as your heart beats. The second, or bottom number, is called the diastolic pressure. It is a measure of the pressure in your arteries as the heart relaxes. For most people, a normal blood pressure is below 120/80. Your personal target blood pressure may vary depending on your medical conditions, your age, and other factors. Blood pressure is classified into four stages. Based on your blood pressure reading, your health care provider may use the following stages to determine what type of treatment you need, if any. Systolic pressure and diastolic pressure are measured in a unit called millimeters of mercury (mmHg). Normal Systolic pressure: below 762. Diastolic pressure: below 80. Elevated Systolic pressure: 831-517. Diastolic pressure: below 80. Hypertension stage 1 Systolic pressure: 616-073. Diastolic pressure: 71-06. Hypertension stage 2 Systolic pressure: 269 or above. Diastolic pressure: 90 or above. How can this condition affect me? Managing your  hypertension is very important. Over time, hypertension can damage the arteries and decrease blood flow to parts of the body, including the brain, heart, and kidneys. Having untreated or uncontrolled hypertension can lead to: A heart attack. A stroke. A weakened blood vessel (aneurysm). Heart failure. Kidney damage. Eye damage. Memory and concentration problems. Vascular dementia. What actions can I take to manage this condition? Hypertension can be managed by making lifestyle changes and possibly by taking medicines. Your health care provider will help you make a plan to bring your blood pressure within a normal range. You may be referred for counseling on a healthy diet and physical activity. Nutrition  Eat a diet that is high in fiber and potassium, and low in salt (sodium), added sugar, and fat. An example eating plan is called the DASH diet. DASH stands for Dietary Approaches to Stop Hypertension. To eat this way: Eat plenty of fresh fruits and vegetables. Try to fill one-half of your plate at each meal with fruits and vegetables. Eat whole grains, such as whole-wheat pasta, brown rice, or whole-grain bread. Fill about one-fourth of your plate with whole grains. Eat low-fat dairy products. Avoid fatty cuts of meat, processed or cured meats, and poultry with skin. Fill about one-fourth of your plate with lean proteins such as fish, chicken without skin, beans, eggs, and tofu. Avoid pre-made and processed foods. These tend to be higher in sodium, added sugar, and fat. Reduce your daily sodium intake. Many people with hypertension  should eat less than 1,500 mg of sodium a day. Lifestyle  Work with your health care provider to maintain a healthy body weight or to lose weight. Ask what an ideal weight is for you. Get at least 30 minutes of exercise that causes your heart to beat faster (aerobic exercise) most days of the week. Activities may include walking, swimming, or biking. Include  exercise to strengthen your muscles (resistance exercise), such as weight lifting, as part of your weekly exercise routine. Try to do these types of exercises for 30 minutes at least 3 days a week. Do not use any products that contain nicotine or tobacco. These products include cigarettes, chewing tobacco, and vaping devices, such as e-cigarettes. If you need help quitting, ask your health care provider. Control any long-term (chronic) conditions you have, such as high cholesterol or diabetes. Identify your sources of stress and find ways to manage stress. This may include meditation, deep breathing, or making time for fun activities. Alcohol use Do not drink alcohol if: Your health care provider tells you not to drink. You are pregnant, may be pregnant, or are planning to become pregnant. If you drink alcohol: Limit how much you have to: 0-1 drink a day for women. 0-2 drinks a day for men. Know how much alcohol is in your drink. In the [place] one drink equals one 12 oz bottle of beer (355 mL), one 5 oz glass of wine (148 mL), or one 1 oz glass of hard liquor (44 mL). Medicines Your health care provider may prescribe medicine if lifestyle changes are not enough to get your blood pressure under control and if: Your systolic blood pressure is 130 or higher. Your diastolic blood pressure is 80 or higher. Take medicines only as told by your health care provider. Follow the directions carefully. Blood pressure medicines must be taken as told by your health care provider. The medicine does not work as well when you skip doses. Skipping doses also puts you at risk for problems. Monitoring Before you monitor your blood pressure: Do not smoke, drink caffeinated beverages, or exercise within 30 minutes before taking a measurement. Use the bathroom and empty your bladder (urinate). Sit quietly for at least 5 minutes before taking measurements. Monitor your blood pressure at home as told by your health  care provider. To do this: Sit with your back straight and supported. Place your feet flat on the floor. Do not cross your legs. Support your arm on a flat surface, such as a table. Make sure your upper arm is at heart level. Each time you measure, take two or three readings one minute apart and record the results. You may also need to have your blood pressure checked regularly by your health care provider. General information Talk with your health care provider about your diet, exercise habits, and other lifestyle factors that may be contributing to hypertension. Review all the medicines you take with your health care provider because there may be side effects or interactions. Keep all follow-up visits. Your health care provider can help you create and adjust your plan for managing your high blood pressure. Where to find more information National Heart, Lung, and Blood Institute: https://wilson-eaton.com/ American Heart Association: www.heart.org Contact a health care provider if: You think you are having a reaction to medicines you have taken. You have repeated (recurrent) headaches. You feel dizzy. You have swelling in your ankles. You have trouble with your vision. Get help right away if: You develop a severe headache  or confusion. You have unusual weakness or numbness, or you feel faint. You have severe pain in your chest or abdomen. You vomit repeatedly. You have trouble breathing. These symptoms may be an emergency. Get help right away. Call 911. Do not wait to see if the symptoms will go away. Do not drive yourself to the hospital. Summary Hypertension is when the force of blood pumping through your arteries is too strong. If this condition is not controlled, it may put you at risk for serious complications. Your personal target blood pressure may vary depending on your medical conditions, your age, and other factors. For most people, a normal blood pressure is less than  120/80. Hypertension is managed by lifestyle changes, medicines, or both. Lifestyle changes to help manage hypertension include losing weight, eating a healthy, low-sodium diet, exercising more, stopping smoking, and limiting alcohol. This information is not intended to replace advice given to you by your health care provider. Make sure you discuss any questions you have with your health care provider. Document Revised: 12/29/2020 Document Reviewed: 12/29/2020 Elsevier Patient Education  [place],   Merri Ray, MD [place] 05/18/22 9:31 AM

## 2022-05-18 NOTE — Patient Instructions (Addendum)
Stop sinex nasal spray - that may be a cause of something called rhinitis medicamentosa. Start flonase. Congestion may temporarily worsen due to prolonged use of Sinex. If not improved in next 2 weeks, follow up and we can discuss other options.   No other med changes today. See info about blood pressure below.   Managing Your Hypertension Hypertension, also called high blood pressure, is when the force of the blood pressing against the walls of the arteries is too strong. Arteries are blood vessels that carry blood from your heart throughout your body. Hypertension forces the heart to work harder to pump blood and may cause the arteries to become narrow or stiff. Understanding blood pressure readings A blood pressure reading includes a higher number over a lower number: The first, or top, number is called the systolic pressure. It is a measure of the pressure in your arteries as your heart beats. The second, or bottom number, is called the diastolic pressure. It is a measure of the pressure in your arteries as the heart relaxes. For most people, a normal blood pressure is below 120/80. Your personal target blood pressure may vary depending on your medical conditions, your age, and other factors. Blood pressure is classified into four stages. Based on your blood pressure reading, your health care provider may use the following stages to determine what type of treatment you need, if any. Systolic pressure and diastolic pressure are measured in a unit called millimeters of mercury (mmHg). Normal Systolic pressure: below 563. Diastolic pressure: below 80. Elevated Systolic pressure: 149-702. Diastolic pressure: below 80. Hypertension stage 1 Systolic pressure: 637-858. Diastolic pressure: 85-02. Hypertension stage 2 Systolic pressure: 774 or above. Diastolic pressure: 90 or above. How can this condition affect me? Managing your hypertension is very important. Over time, hypertension can damage  the arteries and decrease blood flow to parts of the body, including the brain, heart, and kidneys. Having untreated or uncontrolled hypertension can lead to: A heart attack. A stroke. A weakened blood vessel (aneurysm). Heart failure. Kidney damage. Eye damage. Memory and concentration problems. Vascular dementia. What actions can I take to manage this condition? Hypertension can be managed by making lifestyle changes and possibly by taking medicines. Your health care provider will help you make a plan to bring your blood pressure within a normal range. You may be referred for counseling on a healthy diet and physical activity. Nutrition  Eat a diet that is high in fiber and potassium, and low in salt (sodium), added sugar, and fat. An example eating plan is called the DASH diet. DASH stands for Dietary Approaches to Stop Hypertension. To eat this way: Eat plenty of fresh fruits and vegetables. Try to fill one-half of your plate at each meal with fruits and vegetables. Eat whole grains, such as whole-wheat pasta, brown rice, or whole-grain bread. Fill about one-fourth of your plate with whole grains. Eat low-fat dairy products. Avoid fatty cuts of meat, processed or cured meats, and poultry with skin. Fill about one-fourth of your plate with lean proteins such as fish, chicken without skin, beans, eggs, and tofu. Avoid pre-made and processed foods. These tend to be higher in sodium, added sugar, and fat. Reduce your daily sodium intake. Many people with hypertension should eat less than 1,500 mg of sodium a day. Lifestyle  Work with your health care provider to maintain a healthy body weight or to lose weight. Ask what an ideal weight is for you. Get at least 30 minutes of exercise  that causes your heart to beat faster (aerobic exercise) most days of the week. Activities may include walking, swimming, or biking. Include exercise to strengthen your muscles (resistance exercise), such as  weight lifting, as part of your weekly exercise routine. Try to do these types of exercises for 30 minutes at least 3 days a week. Do not use any products that contain nicotine or tobacco. These products include cigarettes, chewing tobacco, and vaping devices, such as e-cigarettes. If you need help quitting, ask your health care provider. Control any long-term (chronic) conditions you have, such as high cholesterol or diabetes. Identify your sources of stress and find ways to manage stress. This may include meditation, deep breathing, or making time for fun activities. Alcohol use Do not drink alcohol if: Your health care provider tells you not to drink. You are pregnant, may be pregnant, or are planning to become pregnant. If you drink alcohol: Limit how much you have to: 0-1 drink a day for women. 0-2 drinks a day for men. Know how much alcohol is in your drink. In the [place] one drink equals one 12 oz bottle of beer (355 mL), one 5 oz glass of wine (148 mL), or one 1 oz glass of hard liquor (44 mL). Medicines Your health care provider may prescribe medicine if lifestyle changes are not enough to get your blood pressure under control and if: Your systolic blood pressure is 130 or higher. Your diastolic blood pressure is 80 or higher. Take medicines only as told by your health care provider. Follow the directions carefully. Blood pressure medicines must be taken as told by your health care provider. The medicine does not work as well when you skip doses. Skipping doses also puts you at risk for problems. Monitoring Before you monitor your blood pressure: Do not smoke, drink caffeinated beverages, or exercise within 30 minutes before taking a measurement. Use the bathroom and empty your bladder (urinate). Sit quietly for at least 5 minutes before taking measurements. Monitor your blood pressure at home as told by your health care provider. To do this: Sit with your back straight and  supported. Place your feet flat on the floor. Do not cross your legs. Support your arm on a flat surface, such as a table. Make sure your upper arm is at heart level. Each time you measure, take two or three readings one minute apart and record the results. You may also need to have your blood pressure checked regularly by your health care provider. General information Talk with your health care provider about your diet, exercise habits, and other lifestyle factors that may be contributing to hypertension. Review all the medicines you take with your health care provider because there may be side effects or interactions. Keep all follow-up visits. Your health care provider can help you create and adjust your plan for managing your high blood pressure. Where to find more information National Heart, Lung, and Blood Institute: https://wilson-eaton.com/ American Heart Association: www.heart.org Contact a health care provider if: You think you are having a reaction to medicines you have taken. You have repeated (recurrent) headaches. You feel dizzy. You have swelling in your ankles. You have trouble with your vision. Get help right away if: You develop a severe headache or confusion. You have unusual weakness or numbness, or you feel faint. You have severe pain in your chest or abdomen. You vomit repeatedly. You have trouble breathing. These symptoms may be an emergency. Get help right away. Call 911. Do not wait  to see if the symptoms will go away. Do not drive yourself to the hospital. Summary Hypertension is when the force of blood pumping through your arteries is too strong. If this condition is not controlled, it may put you at risk for serious complications. Your personal target blood pressure may vary depending on your medical conditions, your age, and other factors. For most people, a normal blood pressure is less than 120/80. Hypertension is managed by lifestyle changes, medicines, or  both. Lifestyle changes to help manage hypertension include losing weight, eating a healthy, low-sodium diet, exercising more, stopping smoking, and limiting alcohol. This information is not intended to replace advice given to you by your health care provider. Make sure you discuss any questions you have with your health care provider. Document Revised: 12/29/2020 Document Reviewed: 12/29/2020 Elsevier Patient Education  [place].

## 2022-07-05 ENCOUNTER — Encounter: Payer: Self-pay | Admitting: Radiology

## 2022-11-16 ENCOUNTER — Ambulatory Visit: Payer: BC Managed Care – PPO | Admitting: Family Medicine

## 2022-11-28 ENCOUNTER — Encounter (INDEPENDENT_AMBULATORY_CARE_PROVIDER_SITE_OTHER): Payer: Self-pay

## 2022-12-14 ENCOUNTER — Ambulatory Visit: Payer: BC Managed Care – PPO | Admitting: Family Medicine

## 2022-12-14 VITALS — BP 138/78 | HR 89 | Temp 99.0°F | Ht 73.0 in | Wt 308.2 lb

## 2022-12-14 DIAGNOSIS — R7303 Prediabetes: Secondary | ICD-10-CM

## 2022-12-14 DIAGNOSIS — I10 Essential (primary) hypertension: Secondary | ICD-10-CM | POA: Diagnosis not present

## 2022-12-14 DIAGNOSIS — E785 Hyperlipidemia, unspecified: Secondary | ICD-10-CM

## 2022-12-14 LAB — LIPID PANEL
Cholesterol: 170 mg/dL (ref 0–200)
HDL: 37.8 mg/dL — ABNORMAL LOW (ref >39.00)
LDL Cholesterol: 118 mg/dL — ABNORMAL HIGH (ref 0–99)
NonHDL: 132.45
Total CHOL/HDL Ratio: 5
Triglycerides: 71 mg/dL (ref 0.0–149.0)
VLDL: 14.2 mg/dL (ref 0.0–40.0)

## 2022-12-14 LAB — COMPREHENSIVE METABOLIC PANEL
ALT: 25 U/L (ref 0–53)
AST: 23 U/L (ref 0–37)
Albumin: 4.2 g/dL (ref 3.5–5.2)
Alkaline Phosphatase: 64 U/L (ref 39–117)
BUN: 17 mg/dL (ref 6–23)
CO2: 27 meq/L (ref 19–32)
Calcium: 9.9 mg/dL (ref 8.4–10.5)
Chloride: 96 meq/L (ref 96–112)
Creatinine, Ser: 0.86 mg/dL (ref 0.40–1.50)
GFR: 90.33 mL/min (ref >60.00)
Glucose, Bld: 139 mg/dL — ABNORMAL HIGH (ref 70–99)
Potassium: 3.9 meq/L (ref 3.5–5.1)
Sodium: 132 meq/L — ABNORMAL LOW (ref 135–145)
Total Bilirubin: 0.5 mg/dL (ref 0.2–1.2)
Total Protein: 7.8 g/dL (ref 6.0–8.3)

## 2022-12-14 MED ORDER — METOPROLOL SUCCINATE ER 100 MG PO TB24: ORAL_TABLET | ORAL | 1 refills | Status: DC

## 2022-12-14 MED ORDER — ATORVASTATIN CALCIUM 10 MG PO TABS: ORAL_TABLET | ORAL | 1 refills | Status: DC

## 2022-12-14 MED ORDER — LOSARTAN POTASSIUM-HCTZ 100-25 MG PO TABS: 1.0000 | ORAL_TABLET | Freq: Every day | ORAL | 1 refills | Status: DC

## 2022-12-14 MED ORDER — AMLODIPINE BESYLATE 10 MG PO TABS: 10.0000 mg | ORAL_TABLET | Freq: Every day | ORAL | 1 refills | Status: DC

## 2022-12-14 MED ORDER — DOXAZOSIN MESYLATE 4 MG PO TABS: 4.0000 mg | ORAL_TABLET | Freq: Every day | ORAL | 1 refills | Status: DC

## 2022-12-14 NOTE — Patient Instructions (Addendum)
I will check labs today. Restart Lipitor daily for now, then repeat labs in next few months.  Water-based exercise may be easier on your knees.  Continue to watch diet, avoid late-night meals, avoid sugar-containing beverages.  Sorry to hear about the stressors in life right now.  Hang in there, but please let me know if I can help.  Thanks for coming in today.

## 2022-12-14 NOTE — Progress Notes (Unsigned)
**Note Zachary-Identified via Obfuscation** Subjective:  Patient ID: Zachary Burrs., male    DOB: 17-Jul-1955  Age: 67 y.o. MRN: [id number]  CC:  Chief Complaint  Patient presents with   Medical Management of Chronic Issues    Pt is doing well, medications are doing well     HPI Zachary Burrs. presents for   Prediabetes: Diet/exercise approach, weight did improve previously with cutting back on starches and soda but unfortunately weight has increased since his last visit.  Home blood sugar readings.  Exercise - just restarted exercise at gym a month ago. Some knee issues and prior knee replacement - other knee with end stage arthritis.  Diet: some fast food at times. Late meals at times.  No regular sodas. Diet improving recently.  Stress in life - spouse with health issues, cousin committed suicide in April. He has been coping ok with these stressors.   Lab Results  Component Value Date   HGBA1C 6.3 05/18/2022   Wt Readings from Last 3 Encounters:  12/14/22 (!) 308 lb 3.2 oz (139.8 kg)  05/18/22 296 lb 3.2 oz (134.4 kg)  11/03/21 290 lb (131.5 kg)   Hypertension: Amlodipine 10 mg daily, doxazosin 4 mg daily, losartan/HCTZ 100/25 mg daily, Toprol-XL 100 mg daily. No new side effects.  Home readings: wrist monitor - 130/70 range.  BP Readings from Last 3 Encounters:  12/14/22 138/78  05/18/22 136/78  11/05/21 124/69   Lab Results  Component Value Date   CREATININE 0.85 05/18/2022   Hyperlipidemia: Lipitor 10 mg daily.  No new myalgias or side effects when taking med.  Ran out of med few months ago. Did not call for refill.   Lab Results  Component Value Date   CHOL 138 05/18/2022   HDL 43.70 05/18/2022   LDLCALC 82 05/18/2022   TRIG 62.0 05/18/2022   CHOLHDL 3 05/18/2022   Lab Results  Component Value Date   ALT 25 05/18/2022   AST 23 05/18/2022   ALKPHOS 69 05/18/2022   BILITOT 0.6 05/18/2022      History Patient Active Problem List   Diagnosis Date Noted   Status post total left  knee replacement 11/03/2021   Unilateral primary osteoarthritis, left knee 11/02/2021   OBESITY 05/09/2009   Essential hypertension 05/09/2009   HEMOCCULT POSITIVE STOOL 05/09/2009   HEART MURMUR, HX OF 05/09/2009   Past Medical History:  Diagnosis Date   Allergy    Arthritis    Asthma    Blood transfusion without reported diagnosis    as child   Constipation    Heart murmur    mild   Hyperlipidemia    slight elevation   Hypertension    Past Surgical History:  Procedure Laterality Date   APPENDECTOMY     age 4   COLONOSCOPY  2008   KNEE SURGERY Left 1982   pilonidal abcess     x 2 surgeries to correct this   TONSILLECTOMY     as a child   TOTAL KNEE ARTHROPLASTY Left 11/03/2021   Procedure: LEFT TOTAL KNEE ARTHROPLASTY;  Surgeon: Kathryne Hitch, MD;  Location: [facility];  Service: Orthopedics;  Laterality: Left;   Allergies  Allergen Reactions   Penicillins Rash   Prior to Admission medications   Medication Sig Start Date End Date Taking? Authorizing Provider  amLODipine (NORVASC) 10 MG tablet Take 1 tablet (10 mg total) by mouth daily. 05/18/22  Yes Shade Flood, MD  aspirin 81 MG chewable tablet Chew 1  tablet (81 mg total) by mouth 2 (two) times daily. 11/05/21  Yes Kathryne Hitch, MD  atorvastatin (LIPITOR) 10 MG tablet TAKE 1 TABLET BY MOUTH ONCE DAILY. 05/18/22  Yes Shade Flood, MD  doxazosin (CARDURA) 4 MG tablet Take 1 tablet (4 mg total) by mouth at bedtime. 05/18/22  Yes Shade Flood, MD  fluticasone (FLONASE) 50 MCG/ACT nasal spray Place 2 sprays into both nostrils daily. 05/18/22  Yes Shade Flood, MD  ibuprofen (ADVIL) 200 MG tablet Take 400 mg by mouth every 6 (six) hours as needed for moderate pain.   Yes [provider]  losartan-hydrochlorothiazide (HYZAAR) 100-25 MG tablet Take 1 tablet by mouth daily. 05/18/22  Yes Shade Flood, MD  metoprolol succinate (TOPROL-XL) 100 MG 24 hr tablet TAKE 1 TABLET BY MOUTH  ONCE DAILY. TAKE WITH OR IMMEDIATELY FOLLOWING A MEAL 05/18/22  Yes Shade Flood, MD  ondansetron (ZOFRAN-ODT) 4 MG disintegrating tablet Take 1 tablet (4 mg total) by mouth every 8 (eight) hours as needed for nausea or vomiting. 11/10/21  Yes Kathryne Hitch, MD  oxymetazoline (VICKS SINEX SEVERE DECONGEST) 0.05 % nasal spray Place 1 spray into both nostrils 2 (two) times daily as needed for congestion.   Yes [provider]   Social History   Socioeconomic History   Marital status: Married    Spouse name: Not on file   Number of children: Not on file   Years of education: Not on file   Highest education level: Bachelor's degree (e.g., BA, AB, BS)  Occupational History   Not on file  Tobacco Use   Smoking status: Former    Current packs/day: 0.00    Average packs/day: 0.5 packs/day for 10.0 years (5.0 ttl pk-yrs)    Types: Cigarettes    Start date: 02/08/1969    Quit date: 02/09/1979    Years since quitting: 43.8   Smokeless tobacco: Never  Vaping Use   Vaping status: Never Used  Substance and Sexual Activity   Alcohol use: Yes    Alcohol/week: 0.0 standard drinks of alcohol    Comment: occ glass of wine    Drug use: Yes    Types: Marijuana   Sexual activity: Yes  Other Topics Concern   Not on file  Social History Narrative   Not on file   Social Determinants of Health   Financial Resource Strain: Medium Risk (12/14/2022)   Overall Financial Resource Strain (CARDIA)    Difficulty of Paying Living Expenses: Somewhat hard  Food Insecurity: No Food Insecurity (12/14/2022)   Hunger Vital Sign    Worried About Running Out of Food in the Last Year: Never true    Ran Out of Food in the Last Year: Never true  Transportation Needs: No Transportation Needs (12/14/2022)   PRAPARE - Administrator, Civil Service (Medical): No    Lack of Transportation (Non-Medical): No  Physical Activity: Sufficiently Active (12/14/2022)   Exercise Vital Sign     Days of Exercise per Week: 3 days    Minutes of Exercise per Session: 50 min  Stress: Stress Concern Present (12/14/2022)   [employer] of Occupational Health - Occupational Stress Questionnaire    Feeling of Stress : To some extent  Social Connections: Socially Integrated (12/14/2022)   Social Connection and Isolation Panel [NHANES]    Frequency of Communication with Friends and Family: Twice a week    Frequency of Social Gatherings with Friends and Family: Once a week  Attends Religious Services: More than 4 times per year    Active Member of Clubs or Organizations: Yes    Attends Banker Meetings: More than 4 times per year    Marital Status: Married  Catering manager Violence: Not on file    Review of Systems  Constitutional:  Negative for fatigue and unexpected weight change.  Eyes:  Negative for visual disturbance.  Respiratory:  Negative for cough, chest tightness and shortness of breath.   Cardiovascular:  Negative for chest pain, palpitations and leg swelling.  Gastrointestinal:  Negative for abdominal pain and blood in stool.  Neurological:  Negative for dizziness, light-headedness and headaches.     Objective:   Vitals:   12/14/22 0829  BP: 138/78  Pulse: 89  Temp: 99 F (37.2 C)  TempSrc: Temporal  SpO2: 99%  Weight: (!) 308 lb 3.2 oz (139.8 kg)  Height: 6\' 1"  (1.854 m)     Physical Exam Vitals reviewed.  Constitutional:      Appearance: He is well-developed. He is obese.  HENT:     Head: Normocephalic and atraumatic.  Neck:     Vascular: No carotid bruit or JVD.  Cardiovascular:     Rate and Rhythm: Normal rate and regular rhythm.     Heart sounds: Normal heart sounds. No murmur heard. Pulmonary:     Effort: Pulmonary effort is normal.     Breath sounds: Normal breath sounds. No rales.  Musculoskeletal:     Right lower leg: No edema.     Left lower leg: No edema.  Skin:    General: Skin is warm and dry.  Neurological:      Mental Status: He is alert and oriented to person, place, and time.  Psychiatric:        Mood and Affect: Mood normal.        Assessment & Plan:  Zachary BONNO. is a 67 y.o. male . Prediabetes - Plan: Hemoglobin A1c  -Weight has increased, check A1c.  Plan on 51-month follow-up, but discussed potential other testing, changes if at diabetic level.  Exercise limited due to knee, back issues.  Water-based exercise discussed, importance of diet adherence discussed including avoidance of late-night meals.  Commended on recent changes in exercise and improve diet.    Hyperlipidemia, unspecified hyperlipidemia type - Plan: atorvastatin (LIPITOR) 10 MG tablet, Lipid panel, Comprehensive metabolic panel  -Unfortunately some misunderstanding regarding the atorvastatin.  Off past few months.  Restart 10 mg daily, baseline testing today then likely recheck at next visit.  Tolerating when he was on meds.  Essential hypertension - Plan: amLODipine (NORVASC) 10 MG tablet, doxazosin (CARDURA) 4 MG tablet, losartan-hydrochlorothiazide (HYZAAR) 100-25 MG tablet, metoprolol succinate (TOPROL-XL) 100 MG 24 hr tablet, Comprehensive metabolic panel  -Stable based on home readings, borderline in office.  No med changes at this time.  Situational stressors discussed.  Denies depression at this time, has network for support, coping techniques are working well at this time, denies any additional assistance at this time.  RTC precautions.  Meds ordered this encounter  Medications   atorvastatin (LIPITOR) 10 MG tablet    Sig: TAKE 1 TABLET BY MOUTH ONCE DAILY.    Dispense:  90 tablet    Refill:  1   amLODipine (NORVASC) 10 MG tablet    Sig: Take 1 tablet (10 mg total) by mouth daily.    Dispense:  90 tablet    Refill:  1   doxazosin (CARDURA) 4 MG  tablet    Sig: Take 1 tablet (4 mg total) by mouth at bedtime.    Dispense:  90 tablet    Refill:  1   losartan-hydrochlorothiazide (HYZAAR) 100-25 MG tablet     Sig: Take 1 tablet by mouth daily.    Dispense:  90 tablet    Refill:  1   metoprolol succinate (TOPROL-XL) 100 MG 24 hr tablet    Sig: TAKE 1 TABLET BY MOUTH ONCE DAILY. TAKE WITH OR IMMEDIATELY FOLLOWING A MEAL    Dispense:  90 tablet    Refill:  1   Patient Instructions  I will check labs today. Restart Lipitor daily for now, then repeat labs in next few months.  Water-based exercise may be easier on your knees.  Continue to watch diet, avoid late-night meals, avoid sugar-containing beverages.  Sorry to hear about the stressors in life right now.  Hang in there, but please let me know if I can help.  Thanks for coming in today.       Signed,   [name], MD [facility], [facility] 12/14/22 9:04 AM

## 2022-12-17 LAB — HEMOGLOBIN A1C: Hgb A1c MFr Bld: 6.5 % (ref 4.6–6.5)

## 2023-03-15 ENCOUNTER — Ambulatory Visit: Payer: BC Managed Care – PPO | Admitting: Family Medicine

## 2023-03-15 ENCOUNTER — Encounter: Payer: Self-pay | Admitting: Family Medicine

## 2023-03-15 VITALS — BP 128/74 | HR 88 | Temp 97.8°F | Ht 73.0 in | Wt 310.0 lb

## 2023-03-15 DIAGNOSIS — R7303 Prediabetes: Secondary | ICD-10-CM

## 2023-03-15 DIAGNOSIS — E785 Hyperlipidemia, unspecified: Secondary | ICD-10-CM | POA: Diagnosis not present

## 2023-03-15 DIAGNOSIS — Z23 Encounter for immunization: Secondary | ICD-10-CM

## 2023-03-15 DIAGNOSIS — I10 Essential (primary) hypertension: Secondary | ICD-10-CM

## 2023-03-15 DIAGNOSIS — E871 Hypo-osmolality and hyponatremia: Secondary | ICD-10-CM | POA: Diagnosis not present

## 2023-03-15 LAB — COMPREHENSIVE METABOLIC PANEL
ALT: 31 U/L (ref 0–53)
AST: 26 U/L (ref 0–37)
Albumin: 4.5 g/dL (ref 3.5–5.2)
Alkaline Phosphatase: 62 U/L (ref 39–117)
BUN: 20 mg/dL (ref 6–23)
CO2: 28 meq/L (ref 19–32)
Calcium: 9.7 mg/dL (ref 8.4–10.5)
Chloride: 98 meq/L (ref 96–112)
Creatinine, Ser: 0.88 mg/dL (ref 0.40–1.50)
GFR: 89.55 mL/min (ref >60.00)
Glucose, Bld: 130 mg/dL — ABNORMAL HIGH (ref 70–99)
Potassium: 4 meq/L (ref 3.5–5.1)
Sodium: 134 meq/L — ABNORMAL LOW (ref 135–145)
Total Bilirubin: 0.7 mg/dL (ref 0.2–1.2)
Total Protein: 8.2 g/dL (ref 6.0–8.3)

## 2023-03-15 LAB — LIPID PANEL
Cholesterol: 149 mg/dL (ref 0–200)
HDL: 38 mg/dL — ABNORMAL LOW (ref >39.00)
LDL Cholesterol: 92 mg/dL (ref 0–99)
NonHDL: 111.09
Total CHOL/HDL Ratio: 4
Triglycerides: 97 mg/dL (ref 0.0–149.0)
VLDL: 19.4 mg/dL (ref 0.0–40.0)

## 2023-03-15 LAB — HEMOGLOBIN A1C: Hgb A1c MFr Bld: 6.7 % — ABNORMAL HIGH (ref 4.6–6.5)

## 2023-03-15 NOTE — Patient Instructions (Signed)
Thank you for coming in today.  I will recheck the cholesterol levels now that you are back on medication and repeat the sodium level and blood sugar average.  As we discussed if that level is still at diabetes level we could consider a medication including one of the medications that treats diabetes and can help with weight loss.  Let me know if you would like to discuss that further.  Follow-up in 3 months but let me know if there are questions sooner.  Take care!

## 2023-03-15 NOTE — Progress Notes (Signed)
**Note Zachary-Identified via Obfuscation** Subjective:  Patient ID: Zachary Burrs., male    DOB: Jun 24, 1955  Age: 67 y.o. MRN: [id number]  CC:  Chief Complaint  Patient presents with   Medical Management of Chronic Issues    Pt is doing well no questions,     HPI Zachary [name]. presents for   Prediabetes with obesity.  With A1c increase to 6.5 in August.  Prior 6.0, 6.3.  No new meds but diet/activity/exercise discussed. No significant changes. Trying to be consistent with diet, avoiding carbs, some exercise.  No FH of MEN syndrome, thyroid CA or pancreatitis.  Lab Results  Component Value Date   HGBA1C 6.5 12/14/2022  Body mass index is 40.9 kg/m. Wt Readings from Last 3 Encounters:  03/15/23 (!) 310 lb (140.6 kg)  12/14/22 (!) 308 lb 3.2 oz (139.8 kg)  05/18/22 296 lb 3.2 oz (134.4 kg)   Hypertension: Amlodipine, doxazosin, losartan/hct, toprol. Out of doxazosin for a few days - will pick up refill.  BP Readings from Last 3 Encounters:  03/15/23 128/74  12/14/22 138/78  05/18/22 136/78   Lab Results  Component Value Date   CREATININE 0.86 12/14/2022    Hyperlipidemia: Treated with Lipitor, off at last visit with slight elevated readings.  Restarted after last visit. No new myalgias/arthralgias.   Lab Results  Component Value Date   CHOL 170 12/14/2022   HDL 37.80 (L) 12/14/2022   LDLCALC 118 (H) 12/14/2022   TRIG 71.0 12/14/2022   CHOLHDL 5 12/14/2022   Lab Results  Component Value Date   ALT 25 12/14/2022   AST 23 12/14/2022   ALKPHOS 64 12/14/2022   BILITOT 0.5 12/14/2022   Hyponatremia Borderline on last labs, 132.  Previously 138 in January. No new HA, dizziness. Feeling ok.   HM: Flu vaccine today.  Covid booster recommended at pharmacy and second shingrix.   History Patient Active Problem List   Diagnosis Date Noted   Status post total left knee replacement 11/03/2021   Unilateral primary osteoarthritis, left knee 11/02/2021   OBESITY 05/09/2009   Essential hypertension  05/09/2009   HEMOCCULT POSITIVE STOOL 05/09/2009   HEART MURMUR, HX OF 05/09/2009   Past Medical History:  Diagnosis Date   Allergy    Arthritis    Asthma    Blood transfusion without reported diagnosis    as child   Constipation    Heart murmur    mild   Hyperlipidemia    slight elevation   Hypertension    Past Surgical History:  Procedure Laterality Date   APPENDECTOMY     age 11   COLONOSCOPY  2008   KNEE SURGERY Left 1982   pilonidal abcess     x 2 surgeries to correct this   TONSILLECTOMY     as a child   TOTAL KNEE ARTHROPLASTY Left 11/03/2021   Procedure: LEFT TOTAL KNEE ARTHROPLASTY;  Surgeon: Zachary Hitch, MD;  Location: [facility];  Service: Orthopedics;  Laterality: Left;   Allergies  Allergen Reactions   Penicillins Rash   Prior to Admission medications   Medication Sig Start Date End Date Taking? Authorizing Provider  amLODipine (NORVASC) 10 MG tablet Take 1 tablet (10 mg total) by mouth daily. 12/14/22  Yes Zachary Flood, MD  aspirin 81 MG chewable tablet Chew 1 tablet (81 mg total) by mouth 2 (two) times daily. 11/05/21  Yes Zachary Hitch, MD  atorvastatin (LIPITOR) 10 MG tablet TAKE 1 TABLET BY MOUTH ONCE DAILY. 12/14/22  Yes Zachary Flood, MD  doxazosin (CARDURA) 4 MG tablet Take 1 tablet (4 mg total) by mouth at bedtime. 12/14/22  Yes Zachary Flood, MD  fluticasone [facility]) 50 MCG/ACT nasal spray Place 2 sprays into both nostrils daily. 05/18/22  Yes Zachary Flood, MD  ibuprofen (ADVIL) 200 MG tablet Take 400 mg by mouth every 6 (six) hours as needed for moderate pain.   Yes [provider]  losartan-hydrochlorothiazide (HYZAAR) 100-25 MG tablet Take 1 tablet by mouth daily. 12/14/22  Yes Zachary Flood, MD  metoprolol succinate (TOPROL-XL) 100 MG 24 hr tablet TAKE 1 TABLET BY MOUTH ONCE DAILY. TAKE WITH OR IMMEDIATELY FOLLOWING A MEAL 12/14/22  Yes Zachary Flood, MD   Social History   Socioeconomic History    Marital status: Married    Spouse name: Not on file   Number of children: Not on file   Years of education: Not on file   Highest education level: Bachelor's degree (e.g., BA, AB, BS)  Occupational History   Not on file  Tobacco Use   Smoking status: Former    Current packs/day: 0.00    Average packs/day: 0.5 packs/day for 10.0 years (5.0 ttl pk-yrs)    Types: Cigarettes    Start date: 02/08/1969    Quit date: 02/09/1979    Years since quitting: 44.1   Smokeless tobacco: Never  Vaping Use   Vaping status: Never Used  Substance and Sexual Activity   Alcohol use: Yes    Alcohol/week: 0.0 standard drinks of alcohol    Comment: occ glass of wine    Drug use: Yes    Types: Marijuana   Sexual activity: Yes  Other Topics Concern   Not on file  Social History Narrative   Not on file   Social Determinants of Health   Financial Resource Strain: Low Risk  (03/14/2023)   Overall Financial Resource Strain (CARDIA)    Difficulty of Paying Living Expenses: Not very hard  Recent Concern: Financial Resource Strain - Medium Risk (12/14/2022)   Overall Financial Resource Strain (CARDIA)    Difficulty of Paying Living Expenses: Somewhat hard  Food Insecurity: No Food Insecurity (03/14/2023)   Hunger Vital Sign    Worried About Running Out of Food in the Last Year: Never true    Ran Out of Food in the Last Year: Never true  Transportation Needs: No Transportation Needs (03/14/2023)   PRAPARE - Administrator, Civil Service (Medical): No    Lack of Transportation (Non-Medical): No  Physical Activity: Sufficiently Active (03/14/2023)   Exercise Vital Sign    Days of Exercise per Week: 3 days    Minutes of Exercise per Session: 60 min  Stress: No Stress Concern Present (03/14/2023)   [employer] of Occupational Health - Occupational Stress Questionnaire    Feeling of Stress : Only a little  Recent Concern: Stress - Stress Concern Present (12/14/2022)   [employer]  of Occupational Health - Occupational Stress Questionnaire    Feeling of Stress : To some extent  Social Connections: Socially Integrated (03/14/2023)   Social Connection and Isolation Panel [NHANES]    Frequency of Communication with Friends and Family: Three times a week    Frequency of Social Gatherings with Friends and Family: Three times a week    Attends Religious Services: More than 4 times per year    Active Member of Clubs or Organizations: Yes    Attends Banker Meetings: More than  4 times per year    Marital Status: Married  Catering manager Violence: Not on file    Review of Systems  Constitutional:  Negative for fatigue and unexpected weight change.  Eyes:  Negative for visual disturbance.  Respiratory:  Negative for cough, chest tightness and shortness of breath.   Cardiovascular:  Negative for chest pain, palpitations and leg swelling.  Gastrointestinal:  Negative for abdominal pain and blood in stool.  Neurological:  Negative for dizziness, light-headedness and headaches.     Objective:   Vitals:   03/15/23 0807  BP: 128/74  Pulse: 88  Temp: 97.8 F (36.6 C)  TempSrc: Temporal  SpO2: 98%  Weight: (!) 310 lb (140.6 kg)  Height: 6\' 1"  (1.854 m)     Physical Exam Vitals reviewed.  Constitutional:      Appearance: He is well-developed.  HENT:     Head: Normocephalic and atraumatic.  Neck:     Vascular: No carotid bruit or JVD.  Cardiovascular:     Rate and Rhythm: Normal rate and regular rhythm.     Heart sounds: Normal heart sounds. No murmur heard. Pulmonary:     Effort: Pulmonary effort is normal.     Breath sounds: Normal breath sounds. No rales.  Musculoskeletal:     Right lower leg: No edema.     Left lower leg: No edema.  Skin:    General: Skin is warm and dry.  Neurological:     Mental Status: He is alert and oriented to person, place, and time.  Psychiatric:        Mood and Affect: Mood normal.        Assessment &  Plan:  DEMARIOUS YOUSEF. is a 67 y.o. male . Essential hypertension - Plan: Comprehensive metabolic panel  -Borderline last visit, improved today, check labs.  No med changes for now.  Has refills until next visit.  Need for influenza vaccination - Plan: Flu Vaccine Trivalent High Dose (Fluad)  -Flu vaccine given, plans on second Shingrix, COVID booster at pharmacy.  Prediabetes - Plan: Hemoglobin A1c, Comprehensive metabolic panel  -With last A1c borderline diabetic level at 6.5.  Weight up a few pounds.  Repeat A1c, consider medication if increasing including option of GLP-1 to assist with diabetes and weight loss.  Potential side effects discussed, but can discuss further if he would like to try that medication.  34-month follow-up planned.  Hyperlipidemia, unspecified hyperlipidemia type - Plan: Lipid panel  -Repeat labs back on Lipitor.  Tolerating current dose.  Hyponatremia  -Mild, repeat CMP.  Asymptomatic.  RTC precautions.  No orders of the defined types were placed in this encounter.  Patient Instructions  Thank you for coming in today.  I will recheck the cholesterol levels now that you are back on medication and repeat the sodium level and blood sugar average.  As we discussed if that level is still at diabetes level we could consider a medication including one of the medications that treats diabetes and can help with weight loss.  Let me know if you would like to discuss that further.  Follow-up in 3 months but let me know if there are questions sooner.  Take care!    Signed,   Zachary Staggers, MD [facility], [facility] 03/15/23 8:33 AM

## 2023-06-21 ENCOUNTER — Ambulatory Visit: Payer: BC Managed Care – PPO | Admitting: Family Medicine

## 2023-06-21 VITALS — BP 146/90 | HR 103 | Temp 99.3°F | Ht 73.0 in | Wt 318.0 lb

## 2023-06-21 DIAGNOSIS — E785 Hyperlipidemia, unspecified: Secondary | ICD-10-CM | POA: Diagnosis not present

## 2023-06-21 DIAGNOSIS — G8929 Other chronic pain: Secondary | ICD-10-CM

## 2023-06-21 DIAGNOSIS — R7303 Prediabetes: Secondary | ICD-10-CM | POA: Diagnosis not present

## 2023-06-21 DIAGNOSIS — I10 Essential (primary) hypertension: Secondary | ICD-10-CM

## 2023-06-21 DIAGNOSIS — M545 Low back pain, unspecified: Secondary | ICD-10-CM

## 2023-06-21 DIAGNOSIS — E871 Hypo-osmolality and hyponatremia: Secondary | ICD-10-CM

## 2023-06-21 LAB — COMPREHENSIVE METABOLIC PANEL
ALT: 27 U/L (ref 0–53)
AST: 23 U/L (ref 0–37)
Albumin: 4.4 g/dL (ref 3.5–5.2)
Alkaline Phosphatase: 58 U/L (ref 39–117)
BUN: 17 mg/dL (ref 6–23)
CO2: 29 meq/L (ref 19–32)
Calcium: 9.4 mg/dL (ref 8.4–10.5)
Chloride: 96 meq/L (ref 96–112)
Creatinine, Ser: 0.82 mg/dL (ref 0.40–1.50)
GFR: 91.31 mL/min (ref >60.00)
Glucose, Bld: 156 mg/dL — ABNORMAL HIGH (ref 70–99)
Potassium: 3.8 meq/L (ref 3.5–5.1)
Sodium: 136 meq/L (ref 135–145)
Total Bilirubin: 0.6 mg/dL (ref 0.2–1.2)
Total Protein: 8.2 g/dL (ref 6.0–8.3)

## 2023-06-21 LAB — HEMOGLOBIN A1C: Hgb A1c MFr Bld: 7 % — ABNORMAL HIGH (ref 4.6–6.5)

## 2023-06-21 MED ORDER — LOSARTAN POTASSIUM-HCTZ 100-25 MG PO TABS: 1.0000 | ORAL_TABLET | Freq: Every day | ORAL | 1 refills | Status: DC

## 2023-06-21 MED ORDER — METOPROLOL SUCCINATE ER 100 MG PO TB24: ORAL_TABLET | ORAL | 1 refills | Status: DC

## 2023-06-21 MED ORDER — AMLODIPINE BESYLATE 10 MG PO TABS: 10.0000 mg | ORAL_TABLET | Freq: Every day | ORAL | 1 refills | Status: DC

## 2023-06-21 MED ORDER — ATORVASTATIN CALCIUM 10 MG PO TABS: ORAL_TABLET | ORAL | 1 refills | Status: DC

## 2023-06-21 MED ORDER — DOXAZOSIN MESYLATE 4 MG PO TABS: 4.0000 mg | ORAL_TABLET | Freq: Every day | ORAL | 1 refills | Status: DC

## 2023-06-21 NOTE — Progress Notes (Signed)
**Note Zachary-Identified via Obfuscation**  Subjective:  Patient ID: Zachary Burrs., male    DOB: 10-24-1955  Age: 68 y.o. MRN: [id number]  CC:  Chief Complaint  Patient presents with   Medical Management of Chronic Issues    Pt has no concerns with medications but does have aches and pains.   Back Pain    Pt states the pain is in his back difficulty standing for long periods of time.      HPI Zachary [name]. presents for   No health changes since last visit. Occasional aches and pains, no new injury or areas of concern.   Hypertension: Treated with amlodipine, doxazosin, losartan HCTZ and Toprol. No new side effects with meds.  Home readings: none.  Rare missed evening dose of doxazosin, amlodipine - about once per week.   BP Readings from Last 3 Encounters:  06/21/23 (!) 146/90  03/15/23 128/74  12/14/22 138/78   Lab Results  Component Value Date   CREATININE 0.88 03/15/2023   Prediabetes: With obesity.  Weight is up 8 pounds from last visit.  A1c was at diabetic level in November, continued diet/exercise approach, no new meds at that time. Less exercise recently.  Trying to cut out starches.  Soda/sweet tea - avoiding.  Alcohol - occasional, 2 per week.  No change in thirst, urinary frequency or blurred vision.  Lab Results  Component Value Date   HGBA1C 6.7 (H) 03/15/2023   Wt Readings from Last 3 Encounters:  06/21/23 (!) 318 lb (144.2 kg)  03/15/23 (!) 310 lb (140.6 kg)  12/14/22 (!) 308 lb 3.2 oz (139.8 kg)   Hyperlipidemia: Treated with Lipitor 10mg  every day. No new muscle aches. Some joint aches at times. Back pain.   Lab Results  Component Value Date   CHOL 149 03/15/2023   HDL 38.00 (L) 03/15/2023   LDLCALC 92 03/15/2023   TRIG 97.0 03/15/2023   CHOLHDL 4 03/15/2023   Lab Results  Component Value Date   ALT 31 03/15/2023   AST 26 03/15/2023   ALKPHOS 62 03/15/2023   BILITOT 0.7 03/15/2023   Hyponatremia Borderline on recent check in November.  Previously had been lower  at 132.  Plan to continue to monitor.   Lab Results  Component Value Date   NA 134 (L) 03/15/2023   K 4.0 03/15/2023   CL 98 03/15/2023   CO2 28 03/15/2023   Back pain Ongoing for a year. No injury/fall. Notes more sore if standing for awhile - strain feeling.  No bowel or bladder incontinence, no saddle anesthesia, no lower extremity weakness.  No fever, night sweats, weight loss.  Some increased symptoms as weight increased.  No fall/injury or change in activity.  Prior PT for knee, not back.   Tx; tylenol arthritis - helps pain.   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 No  acute findings.   HM: Immunization History  Administered Date(s) Administered   Fluad Quad(high Dose 65+) 05/18/2022   Fluad Trivalent(High Dose 65+) 03/15/2023   Influenza, Seasonal, Injecte, Preservative Fre 05/24/2012   Influenza,inj,Quad PF,6+ Mos 02/21/2013, 03/20/2014, 01/20/2018, 04/17/2019, 05/02/2020, 01/20/2021, 01/20/2021   PFIZER(Purple Top)SARS-COV-2 Vaccination 07/15/2019, 08/09/2019   PNEUMOCOCCAL CONJUGATE-20 05/18/2022   Tdap 03/25/2017   Zoster Recombinant(Shingrix) 05/02/2020  [facility] - advised to call [facility] and if due for 2nd - to have given there.   History  Patient Active Problem List   Diagnosis Date Noted   Status post total left knee replacement 11/03/2021   Unilateral primary osteoarthritis, left knee 11/02/2021   OBESITY 05/09/2009   Essential hypertension 05/09/2009   HEMOCCULT POSITIVE STOOL 05/09/2009   HEART MURMUR, HX OF 05/09/2009   Past Medical History:  Diagnosis Date   Allergy    Arthritis    Asthma    Blood transfusion without reported diagnosis    as child   Constipation    Heart murmur    mild   Hyperlipidemia    slight elevation   Hypertension    Past Surgical History:  Procedure Laterality Date   APPENDECTOMY      age 72   COLONOSCOPY  2008   KNEE SURGERY Left 1982   pilonidal abcess     x 2 surgeries to correct this   TONSILLECTOMY     as a child   TOTAL KNEE ARTHROPLASTY Left 11/03/2021   Procedure: LEFT TOTAL KNEE ARTHROPLASTY;  Surgeon: Kathryne Hitch, MD;  Location: [facility];  Service: Orthopedics;  Laterality: Left;   Allergies  Allergen Reactions   Penicillins Rash   Prior to Admission medications   Medication Sig Start Date End Date Taking? Authorizing Provider  amLODipine (NORVASC) 10 MG tablet Take 1 tablet (10 mg total) by mouth daily. 12/14/22  Yes Shade Flood, MD  aspirin 81 MG chewable tablet Chew 1 tablet (81 mg total) by mouth 2 (two) times daily. 11/05/21  Yes Kathryne Hitch, MD  atorvastatin (LIPITOR) 10 MG tablet TAKE 1 TABLET BY MOUTH ONCE DAILY. 12/14/22  Yes Shade Flood, MD  doxazosin (CARDURA) 4 MG tablet Take 1 tablet (4 mg total) by mouth at bedtime. 12/14/22  Yes Shade Flood, MD  ibuprofen (ADVIL) 200 MG tablet Take 400 mg by mouth every 6 (six) hours as needed for moderate pain.   Yes [provider]  losartan-hydrochlorothiazide (HYZAAR) 100-25 MG tablet Take 1 tablet by mouth daily. 12/14/22  Yes Shade Flood, MD  metoprolol succinate (TOPROL-XL) 100 MG 24 hr tablet TAKE 1 TABLET BY MOUTH ONCE DAILY. TAKE WITH OR IMMEDIATELY FOLLOWING A MEAL 12/14/22  Yes Shade Flood, MD  fluticasone [facility]) 50 MCG/ACT nasal spray Place 2 sprays into both nostrils daily. Patient not taking: Reported on 06/21/2023 05/18/22   Shade Flood, MD   Social History   Socioeconomic History   Marital status: Married    Spouse name: Not on file   Number of children: Not on file   Years of education: Not on file   Highest education level: Bachelor's degree (e.g., BA, AB, BS)  Occupational History   Not on file  Tobacco Use   Smoking status: Former    Current packs/day: 0.00    Average packs/day: 0.5 packs/day for 10.0 years (5.0 ttl  pk-yrs)    Types: Cigarettes    Start date: 02/08/1969    Quit date: 02/09/1979    Years since quitting: 44.3   Smokeless tobacco: Never  Vaping Use   Vaping status: Never Used  Substance and Sexual Activity   Alcohol use: Yes    Alcohol/week: 0.0 standard drinks of alcohol    Comment: occ glass of wine    Drug use: Yes    Types: Marijuana   Sexual activity: Yes  Other Topics Concern   Not on file  Social History Narrative   Not on file   Social Drivers of Corporate investment banker  Strain: Low Risk  (06/21/2023)   Overall Financial Resource Strain (CARDIA)    Difficulty of Paying Living Expenses: Not very hard  Food Insecurity: No Food Insecurity (06/21/2023)   Hunger Vital Sign    Worried About Running Out of Food in the Last Year: Never true    Ran Out of Food in the Last Year: Never true  Transportation Needs: No Transportation Needs (06/21/2023)   PRAPARE - Administrator, Civil Service (Medical): No    Lack of Transportation (Non-Medical): No  Physical Activity: Sufficiently Active (06/21/2023)   Exercise Vital Sign    Days of Exercise per Week: 3 days    Minutes of Exercise per Session: 60 min  Stress: No Stress Concern Present (06/21/2023)   [employer] of Occupational Health - Occupational Stress Questionnaire    Feeling of Stress : Only a little  Social Connections: Socially Integrated (06/21/2023)   Social Connection and Isolation Panel [NHANES]    Frequency of Communication with Friends and Family: More than three times a week    Frequency of Social Gatherings with Friends and Family: Three times a week    Attends Religious Services: More than 4 times per year    Active Member of Clubs or Organizations: Yes    Attends Banker Meetings: More than 4 times per year    Marital Status: Married  Catering manager Violence: Not on file    Review of Systems   Objective:   Vitals:   06/21/23 0915 06/21/23 0923  BP: (!) 144/82 (!)  146/90  Pulse: (!) 103   Temp: 99.3 F (37.4 C)   TempSrc: Temporal   SpO2: 98%   Weight: (!) 318 lb (144.2 kg)   Height: 6\' 1"  (1.854 m)      Physical Exam Vitals reviewed.  Constitutional:      Appearance: He is well-developed.  HENT:     Head: Normocephalic and atraumatic.  Neck:     Vascular: No carotid bruit or JVD.  Cardiovascular:     Rate and Rhythm: Normal rate and regular rhythm.     Heart sounds: Normal heart sounds. No murmur heard. Pulmonary:     Effort: Pulmonary effort is normal.     Breath sounds: Normal breath sounds. No rales.  Musculoskeletal:     Right lower leg: No edema.     Left lower leg: No edema.     Comments: Lumbar spine, no midline bony tenderness.  Slight discomfort of the lower paraspinals.  Flexion to 90 degrees, ambulating without assistive device.  Negative seated straight leg raise.  Able to heel and toe walk.  Skin:    General: Skin is warm and dry.  Neurological:     Mental Status: He is alert and oriented to person, place, and time.  Psychiatric:        Mood and Affect: Mood normal.        Assessment & Plan:  Zachary [name]. is a 68 y.o. male . Prediabetes - Plan: Comp Met (CMET), HgB A1c With obesity, last A1c at diabetic level.  Check A1c, may need to return to discuss diabetic meds.  Unfortunately weight has increased, diet/exercise changes discussed.  Close follow-up in 1 month.  Essential hypertension - Plan: Comp Met (CMET), amLODipine (NORVASC) 10 MG tablet, doxazosin (CARDURA) 4 MG tablet, losartan-hydrochlorothiazide (HYZAAR) 100-25 MG tablet, metoprolol succinate (TOPROL-XL) 100 MG 24 hr tablet  -Elevated in office, episodic missed doses.  Some sodium added to foods  on further history.  Avoidance of sodium recommended, handout given on salty 6 foods to avoid, consistent use of medications with home monitoring and RTC precautions if persistent elevations.  Asymptomatic at this time.  1 month follow-up if  stable.  Hyponatremia Mild on most recent testing.  He is on HCTZ.  Repeat labs.  No med changes for now.   Hyperlipidemia, unspecified hyperlipidemia type - Plan: atorvastatin (LIPITOR) 10 MG tablet  -Denies new arthralgias/myalgias other than back pain.  Unlikely statin cause.  Continue Lipitor same dose for now.  Chronic bilateral low back pain without sciatica - Plan: DG Lumbar Spine 2-3 Views, Ambulatory referral to Physical Therapy  -No fall/injury, prior degenerative disc disease.  Suspected chronic low back pain, check updated imaging, refer to physical therapy and 1 month follow-up.  RTC precautions.  Meds ordered this encounter  Medications   amLODipine (NORVASC) 10 MG tablet    Sig: Take 1 tablet (10 mg total) by mouth daily.    Dispense:  90 tablet    Refill:  1   atorvastatin (LIPITOR) 10 MG tablet    Sig: TAKE 1 TABLET BY MOUTH ONCE DAILY.    Dispense:  90 tablet    Refill:  1   doxazosin (CARDURA) 4 MG tablet    Sig: Take 1 tablet (4 mg total) by mouth at bedtime.    Dispense:  90 tablet    Refill:  1   losartan-hydrochlorothiazide (HYZAAR) 100-25 MG tablet    Sig: Take 1 tablet by mouth daily.    Dispense:  90 tablet    Refill:  1   metoprolol succinate (TOPROL-XL) 100 MG 24 hr tablet    Sig: TAKE 1 TABLET BY MOUTH ONCE DAILY. TAKE WITH OR IMMEDIATELY FOLLOWING A MEAL    Dispense:  90 tablet    Refill:  1   Patient Instructions  Blood pressure is slightly elevated today, but that could be due in part to some of the increased weight, and occasional missed doses of medication.  Make sure to take medications daily, check your blood pressures at home and if remaining above 140/90 in the next week, let me know.  Additionally monitor salt in the diet and avoid the common high salt foods we discussed.  Depending on labs we may need to discuss medication for blood sugar.  I will let you know.   See information below regarding chronic low back pain.  X-ray at the  [facility] location below, and I will refer you to physical therapy. Follow-up in 1 month and we can see how the blood pressure and back pain is doing at that time.  Thank you for coming in today and take care!   [facility] or xray: Walk in 8:30-4:30 during weekdays, no appointment needed [address]  [place], [place] [address]  Chronic Back Pain Chronic back pain is back pain that lasts longer than 3 months. The cause of your back pain may not be known. Some common causes include: Wear and tear (degenerative disease) of the bones, disks, or tissues that connect bones to each other (ligaments) in your back. Inflammation and stiffness in your back (arthritis). If you have chronic back pain, you may have times when the pain is more intense (flare-ups). You can also learn to manage the pain with home care. Follow these instructions at home: Watch for any changes in your symptoms. Take these actions to help with your pain: Managing pain and stiffness  If told, put ice on the painful area. You may be told to apply ice for the first 24-48 hours after a flare-up starts. Put ice in a plastic bag. Place a towel between your skin and the bag. Leave the ice on for 20 minutes, 2-3 times per day. If told, apply heat to the affected area as often as told by your health care provider. Use the heat source that your provider recommends, such as a moist heat pack or a heating pad. Place a towel between your skin and the heat source. Leave the heat on for 20-30 minutes. If your skin turns bright red, remove the ice or heat right away to prevent skin damage. The risk of damage is higher if you cannot feel pain, heat, or cold. Try soaking in a warm tub. Activity        Avoid bending and other activities that make the pain worse. Have good posture when you stand or sit. When you stand, keep your upper back and neck straight, with your shoulders pulled back. Avoid slouching. When you sit,  keep your back straight. Relax your shoulders. Do not round your shoulders or pull them backward. Do not sit or stand in one place for too long. Take brief periods of rest during the day. This will reduce your pain. Resting in a lying or standing position is often better than sitting to rest. When you rest for longer periods, mix in some mild activity or stretching between periods of rest. This will help to prevent stiffness and pain. Get regular exercise. Ask your provider what activities are safe for you. You may have to avoid lifting. Ask your provider how much you can safely lift. If you do lift, always use the right technique. This means you should: Bend your knees. Keep the load close to your body. Avoid twisting. Medicines Take over-the-counter and prescription medicines only as told by your provider. You may need to take medicines for pain and inflammation. These may be taken by mouth or put on the skin. You may also be given muscle relaxants. Ask your provider if the medicine prescribed to you: Requires you to avoid driving or using machinery. Can cause constipation. You may need to take these actions to prevent or treat constipation: Drink enough fluid to keep your pee (urine) pale yellow. Take over-the-counter or prescription medicines. Eat foods that are high in fiber, such as beans, whole grains, and fresh fruits and vegetables. Limit foods that are high in fat and processed sugars, such as fried or sweet foods. General instructions  Sleep on a firm mattress in a comfortable position. Try lying on your side with your knees slightly bent. If you lie on your back, put a pillow under your knees. Do not use any products that contain nicotine or tobacco. These products include cigarettes, chewing tobacco, and vaping devices, such as e-cigarettes. If you need help quitting, ask your provider. Contact a health care provider if: You have pain that does not get better with rest or  medicine. You have new pain. You have a fever. You lose weight quickly. You have trouble doing your normal activities. You feel weak or numb in one or both of your legs or feet. Get help right away if: You are not able to control when you pee or poop. You have severe back pain and: Nausea or vomiting. Pain in your chest or abdomen. Shortness of breath. You faint. These symptoms may be an emergency. Get help right away. Call  911. Do not wait to see if the symptoms will go away. Do not drive yourself to the hospital. This information is not intended to replace advice given to you by your health care provider. Make sure you discuss any questions you have with your health care provider. Document Revised: 12/04/2021 Document Reviewed: 12/04/2021 Elsevier Patient Education  2024 Elsevier Inc.    Signed,   [name], MD [facility], [facility] 06/21/23 9:48 AM

## 2023-06-21 NOTE — Patient Instructions (Signed)
 Blood pressure is slightly elevated today, but that could be due in part to some of the increased weight, and occasional missed doses of medication.  Make sure to take medications daily, check your blood pressures at home and if remaining above 140/90 in the next week, let me know.  Additionally monitor salt in the diet and avoid the common high salt foods we discussed.  Depending on labs we may need to discuss medication for blood sugar.  I will let you know.   See information below regarding chronic low back pain.  X-ray at the [facility] location below, and I will refer you to physical therapy. Follow-up in 1 month and we can see how the blood pressure and back pain is doing at that time.  Thank you for coming in today and take care!    [facility] or xray: Walk in 8:30-4:30 during weekdays, no appointment needed [address]  [place], [place] [address]  Chronic Back Pain Chronic back pain is back pain that lasts longer than 3 months. The cause of your back pain may not be known. Some common causes include: Wear and tear (degenerative disease) of the bones, disks, or tissues that connect bones to each other (ligaments) in your back. Inflammation and stiffness in your back (arthritis). If you have chronic back pain, you may have times when the pain is more intense (flare-ups). You can also learn to manage the pain with home care. Follow these instructions at home: Watch for any changes in your symptoms. Take these actions to help with your pain: Managing pain and stiffness     If told, put ice on the painful area. You may be told to apply ice for the first 24-48 hours after a flare-up starts. Put ice in a plastic bag. Place a towel between your skin and the bag. Leave the ice on for 20 minutes, 2-3 times per day. If told, apply heat to the affected area as often as told by your health care provider. Use the heat source that your provider recommends, such as a moist heat pack or a  heating pad. Place a towel between your skin and the heat source. Leave the heat on for 20-30 minutes. If your skin turns bright red, remove the ice or heat right away to prevent skin damage. The risk of damage is higher if you cannot feel pain, heat, or cold. Try soaking in a warm tub. Activity        Avoid bending and other activities that make the pain worse. Have good posture when you stand or sit. When you stand, keep your upper back and neck straight, with your shoulders pulled back. Avoid slouching. When you sit, keep your back straight. Relax your shoulders. Do not round your shoulders or pull them backward. Do not sit or stand in one place for too long. Take brief periods of rest during the day. This will reduce your pain. Resting in a lying or standing position is often better than sitting to rest. When you rest for longer periods, mix in some mild activity or stretching between periods of rest. This will help to prevent stiffness and pain. Get regular exercise. Ask your provider what activities are safe for you. You may have to avoid lifting. Ask your provider how much you can safely lift. If you do lift, always use the right technique. This means you should: Bend your knees. Keep the load close to your body. Avoid twisting. Medicines Take over-the-counter and prescription  medicines only as told by your provider. You may need to take medicines for pain and inflammation. These may be taken by mouth or put on the skin. You may also be given muscle relaxants. Ask your provider if the medicine prescribed to you: Requires you to avoid driving or using machinery. Can cause constipation. You may need to take these actions to prevent or treat constipation: Drink enough fluid to keep your pee (urine) pale yellow. Take over-the-counter or prescription medicines. Eat foods that are high in fiber, such as beans, whole grains, and fresh fruits and vegetables. Limit foods that are high  in fat and processed sugars, such as fried or sweet foods. General instructions  Sleep on a firm mattress in a comfortable position. Try lying on your side with your knees slightly bent. If you lie on your back, put a pillow under your knees. Do not use any products that contain nicotine or tobacco. These products include cigarettes, chewing tobacco, and vaping devices, such as e-cigarettes. If you need help quitting, ask your provider. Contact a health care provider if: You have pain that does not get better with rest or medicine. You have new pain. You have a fever. You lose weight quickly. You have trouble doing your normal activities. You feel weak or numb in one or both of your legs or feet. Get help right away if: You are not able to control when you pee or poop. You have severe back pain and: Nausea or vomiting. Pain in your chest or abdomen. Shortness of breath. You faint. These symptoms may be an emergency. Get help right away. Call 911. Do not wait to see if the symptoms will go away. Do not drive yourself to the hospital. This information is not intended to replace advice given to you by your health care provider. Make sure you discuss any questions you have with your health care provider. Document Revised: 12/04/2021 Document Reviewed: 12/04/2021 Elsevier Patient Education  2024 [employer].

## 2023-06-22 ENCOUNTER — Encounter: Payer: Self-pay | Admitting: Family Medicine

## 2023-07-01 ENCOUNTER — Ambulatory Visit (INDEPENDENT_AMBULATORY_CARE_PROVIDER_SITE_OTHER): Payer: BC Managed Care – PPO | Admitting: Physical Therapy

## 2023-07-01 ENCOUNTER — Encounter: Payer: Self-pay | Admitting: Physical Therapy

## 2023-07-01 DIAGNOSIS — M25562 Pain in left knee: Secondary | ICD-10-CM

## 2023-07-01 DIAGNOSIS — M6281 Muscle weakness (generalized): Secondary | ICD-10-CM

## 2023-07-01 DIAGNOSIS — M5459 Other low back pain: Secondary | ICD-10-CM

## 2023-07-01 DIAGNOSIS — M25662 Stiffness of left knee, not elsewhere classified: Secondary | ICD-10-CM

## 2023-07-01 DIAGNOSIS — R600 Localized edema: Secondary | ICD-10-CM

## 2023-07-01 DIAGNOSIS — R262 Difficulty in walking, not elsewhere classified: Secondary | ICD-10-CM

## 2023-07-01 NOTE — Therapy (Signed)
 OUTPATIENT PHYSICAL THERAPY THORACOLUMBAR EVALUATION   Patient Name: Zachary [name]. MRN: [id number] DOB:[birth year], 68 y.o., male Today's Date: 07/01/2023  END OF SESSION:  PT End of Session - 07/01/23 0855     Visit Number 1    Number of Visits 20    Date for PT Re-Evaluation 09/09/23    PT Start Time 0847    PT Stop Time 0930    PT Time Calculation (min) 43 min    Activity Tolerance Patient tolerated treatment well    Behavior During Therapy [facility] for tasks assessed/performed             Past Medical History:  Diagnosis Date   Allergy    Arthritis    Asthma    Blood transfusion without reported diagnosis    as child   Constipation    Heart murmur    mild   Hyperlipidemia    slight elevation   Hypertension    Past Surgical History:  Procedure Laterality Date   APPENDECTOMY     age 41   COLONOSCOPY  2008   KNEE SURGERY Left 1982   pilonidal abcess     x 2 surgeries to correct this   TONSILLECTOMY     as a child   TOTAL KNEE ARTHROPLASTY Left 11/03/2021   Procedure: LEFT TOTAL KNEE ARTHROPLASTY;  Surgeon: Kathryne Hitch, MD;  Location: [facility];  Service: Orthopedics;  Laterality: Left;   Patient Active Problem List   Diagnosis Date Noted   Status post total left knee replacement 11/03/2021   Unilateral primary osteoarthritis, left knee 11/02/2021   OBESITY 05/09/2009   Essential hypertension 05/09/2009   HEMOCCULT POSITIVE STOOL 05/09/2009   HEART MURMUR, HX OF 05/09/2009    PCP: Shade Flood, MD   REFERRING PROVIDER: Shade Flood, MD   REFERRING DIAG: M54.50,G89.29 (ICD-10-CM) - Chronic bilateral low back pain without sciatica   Rationale for Evaluation and Treatment: Rehabilitation  THERAPY DIAG:  Difficulty in walking, not elsewhere classified  Other low back pain  Muscle weakness (generalized)  ONSET DATE: about  year  SUBJECTIVE:                                                                                                                                                                                            SUBJECTIVE STATEMENT: Pt arriving for evaluation of chronic back pain. Pt reporting tenderness in his low back which began almost a year ago. Pt reporting no specific incident.   PERTINENT HISTORY:  H/o left TKA, HTN, heart murmur, arthritis, asthma  PAIN:  NPRS scale: 4-5/10 Pain location: low back  Pain description: achy, tightness, can be sharp at times Aggravating factors: bending, standing longer periods, walking long distances Relieving factors: changing positions, over the counter meds as needed  PRECAUTIONS: None  WEIGHT BEARING RESTRICTIONS: No  FALLS:  Has patient fallen in last 6 months? 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 No  acute findings.   PATIENT SURVEYS:  Patient-Specific Activity Scoring Scheme  "0" represents "unable to perform." "10" represents "able to perform at prior level. 0 1 2 3 4 5 6 7 8 9  10 (Date and Score)   Activity Eval  07/01/23    1. Standing long periods  8    2. Walk longer distances  8    3. Pain or strain in low back 8   4. -   5. -   Score 8    Total score = sum of the activity scores/number of activities Minimum detectable change (90%CI) for average score = 2 points Minimum detectable change (90%CI) for single activity score = 3 points  SCREENING FOR RED FLAGS: Bowel or bladder incontinence: No Cauda equina syndrome: No  COGNITION: Overall cognitive status: WFL normal      SENSATION: WFL  MUSCLE  LENGTH: 07/01/23 Hamstrings: Right 48 deg; Left 52 deg   POSTURE:  rounded shoulders, forward head, and decreased lumbar lordosis  PALPATION: TTP: lumbar paraspinals  LUMBAR ROM:   Directional Preference Assessment: Centralization: Peripheralization:   AROM Eval 07/01/23  Flexion 78  Extension 12  Right lateral flexion 24  Left lateral flexion 18  Right rotation [facility]  Left rotation Limited 50%   (Blank rows = not tested)  LOWER EXTREMITY ROM:     ROM  Right 07/01/23 active Left 07/01/23 active  Hip flexion 102 105  Hip extension    Hip abduction    Hip adduction    Hip internal rotation    Hip external rotation    Knee flexion 110 118  Knee extension -6 0  Ankle dorsiflexion    Ankle plantarflexion    Ankle inversion    Ankle eversion     (Blank rows = not tested)  LOWER EXTREMITY MMT:    MMT Right Eval 07/01/23 Left Eval 07/01/23  Hip flexion 5 5  Hip extension    Hip abduction 5 5  Hip adduction 5 5  Hip internal rotation    Hip external rotation    Knee flexion 5 5  Knee extension 5 5  Ankle dorsiflexion    Ankle plantarflexion    Ankle inversion    Ankle eversion     (Blank rows = not tested)  LUMBAR SPECIAL TESTS:  Straight leg raise test: Negative bil LE's  FUNCTIONAL TESTS:  5 times sit to stand: 12.4 seconds UE support  GAIT: wide BOS, mild antalgic gait due to Rt knee pain  TODAY'S TREATMENT:                                                                                                         DATE: 07/01/22  Therex:    HEP instruction/performance c cues for techniques, handout provided.  Trial set performed of each for comprehension and symptom assessment.  See below for exercise list   PATIENT EDUCATION:  Education details: HEP,  POC Person educated: Patient Education method: Explanation, Demonstration, Verbal cues, and Handouts Education comprehension: verbalized understanding, returned demonstration, and verbal cues required  HOME EXERCISE PROGRAM: Access Code: [email or web address] URL: [email or web address] Date: 07/01/2023 Prepared by: Narda Amber  Exercises - Modified Maisie Fus Stretch  - 2-3 x daily - 7 x weekly - up to 3 minutes hold - Standing Hip Flexor Stretch  - 2-3 x daily - 7 x weekly - 3 reps - 30 seconds hold - Supine Lower Trunk Rotation  - 2-3 x daily - 7 x weekly - 3 reps - 30 seconds hold - Seated Hamstring Stretch  - 2-3 x daily - 7 x weekly - 3 reps - 30 seconds hold - Standing Lumbar Extension at Wall - Forearms  - 2-3 x daily - 7 x weekly - 10 reps - 5-10 seconds hold  ASSESSMENT:  CLINICAL IMPRESSION: Patient is a 68 y.o. male who comes to clinic with complaints of low back pain with mobility, strength and movement coordination deficits that impair their ability to perform usual daily and recreational functional activities without increase difficulty/symptoms at this time.  Patient to benefit from skilled PT services to address impairments and limitations to improve to previous level of function without restriction secondary to condition.   OBJECTIVE IMPAIRMENTS: decreased mobility, difficulty walking, decreased ROM, impaired flexibility, and pain.   ACTIVITY LIMITATIONS: lifting, bending, standing, squatting, and transfers  PARTICIPATION LIMITATIONS: community activity  PERSONAL FACTORS: 3+ comorbidities: see PMH above  are also affecting patient's functional outcome.   REHAB POTENTIAL: Good  CLINICAL DECISION MAKING: Stable/uncomplicated  EVALUATION COMPLEXITY: Low   GOALS: Goals reviewed with patient? Yes  SHORT TERM GOALS: (target date for Short term goals are 3 weeks 07/22/2023)  1. Patient will demonstrate independent use of home exercise program to maintain progress  from in clinic treatments.  Goal status: New  LONG TERM GOALS: (target dates for all long term goals are 10 weeks  09/09/2023 )   1. Patient will demonstrate/report pain at worst less than or equal to 2/10 to facilitate minimal limitation in daily activity secondary to pain symptoms.  Goal status: New   2. Patient will demonstrate independent use of home exercise program to facilitate ability to maintain/progress functional gains from skilled physical therapy services.  Goal status: New   3. Patient will demonstrate Patient specific functional scale avg > or = 10 to indicate reduced disability due to condition.   Goal status: New   4. Patient will demonstrate lumbar extension to >/= 20 degrees with no pain for improvements in functional mobility.   Goal status: New   5.  Pt will be able  to lift 15 pounds from floor to counter height using correct body  mechanics with pain </= 2/10.  Goal status: New   6.  Pt will improve his Rt Trunk rotation to [facility]  Goal status: New   PLAN:  PT FREQUENCY: 1-2x/week  PT DURATION: 10 weeks  PLANNED INTERVENTIONS: Can include 95284- PT Re-evaluation, 97110-Therapeutic exercises, 97530- Therapeutic activity, 97112- Neuromuscular re-education, 97535- Self Care, 97140- Manual therapy, [phone]- Gait training, [phone]- Orthotic Fit/training, [phone]- Canalith repositioning, [id number]- Aquatic Therapy, [phone]- Electrical stimulation (unattended), 97750 Physical performance testing, [id number]- Electrical stimulation (manual), 97016- Vasopneumatic device, [id number]- Ultrasound, [id number]- Traction (mechanical), [id number]- Ionotophoresis 4mg /ml Dexamethasone, Patient/Family education, Balance training, Stair training, Taping, Dry Needling, Joint mobilization, Joint manipulation, Spinal manipulation, Spinal mobilization, Scar mobilization, Vestibular training, Visual/preceptual remediation/compensation, DME instructions, Cryotherapy, and Moist heat.  All performed as medically necessary.   All included unless contraindicated  PLAN FOR NEXT SESSION: Review HEP knowledge/results, core strengthening, ROM       Sharmon Leyden, PT, MPT 07/01/2023, 10:01 AM

## 2023-07-05 ENCOUNTER — Encounter: Admitting: Physical Therapy

## 2023-07-10 ENCOUNTER — Encounter: Payer: Self-pay | Admitting: Physical Therapy

## 2023-07-10 ENCOUNTER — Ambulatory Visit: Admitting: Physical Therapy

## 2023-07-10 DIAGNOSIS — R262 Difficulty in walking, not elsewhere classified: Secondary | ICD-10-CM | POA: Diagnosis not present

## 2023-07-10 DIAGNOSIS — R600 Localized edema: Secondary | ICD-10-CM | POA: Diagnosis not present

## 2023-07-10 DIAGNOSIS — M5459 Other low back pain: Secondary | ICD-10-CM | POA: Diagnosis not present

## 2023-07-10 DIAGNOSIS — M6281 Muscle weakness (generalized): Secondary | ICD-10-CM | POA: Diagnosis not present

## 2023-07-10 NOTE — Therapy (Signed)
 OUTPATIENT PHYSICAL THERAPY TREATMENT   Patient Name: Zachary [name]. MRN: [id number] DOB:Dec 28, 1955, 68 y.o., male Today's Date: 07/10/2023  END OF SESSION:  PT End of Session - 07/10/23 0837     Visit Number 2    Number of Visits 20    Date for PT Re-Evaluation 09/09/23    PT Start Time 0800    PT Stop Time 0840    PT Time Calculation (min) 40 min    Activity Tolerance Patient tolerated treatment well    Behavior During Therapy WFL for tasks assessed/performed              Past Medical History:  Diagnosis Date   Allergy    Arthritis    Asthma    Blood transfusion without reported diagnosis    as child   Constipation    Heart murmur    mild   Hyperlipidemia    slight elevation   Hypertension    Past Surgical History:  Procedure Laterality Date   APPENDECTOMY     age 37   COLONOSCOPY  2008   KNEE SURGERY Left 1982   pilonidal abcess     x 2 surgeries to correct this   TONSILLECTOMY     as a child   TOTAL KNEE ARTHROPLASTY Left 11/03/2021   Procedure: LEFT TOTAL KNEE ARTHROPLASTY;  Surgeon: Kathryne Hitch, MD;  Location: [facility];  Service: Orthopedics;  Laterality: Left;   Patient Active Problem List   Diagnosis Date Noted   Status post total left knee replacement 11/03/2021   Unilateral primary osteoarthritis, left knee 11/02/2021   OBESITY 05/09/2009   Essential hypertension 05/09/2009   HEMOCCULT POSITIVE STOOL 05/09/2009   HEART MURMUR, HX OF 05/09/2009    PCP: Shade Flood, MD   REFERRING PROVIDER: Ermelinda Das, MD   REFERRING DIAG: [phone] (ICD-10-CM) - Chronic bilateral low back pain without sciatica   Rationale for Evaluation and Treatment: Rehabilitation  THERAPY DIAG:  Difficulty in walking, not elsewhere classified  Other low back pain  Muscle weakness (generalized)  Localized edema  ONSET DATE: about  year  SUBJECTIVE:                                                                                                                                                                                            SUBJECTIVE STATEMENT: He relays his back is already feeling better, the exercises have helped  PERTINENT HISTORY:  H/o left TKA, HTN, heart murmur, arthritis, asthma  PAIN:  NPRS scale: 3/10 Pain location: low back Pain description: achy, tightness, can be sharp at times Aggravating factors: bending,  standing longer periods, walking long distances Relieving factors: changing positions, over the counter meds as needed  PRECAUTIONS: None  WEIGHT BEARING RESTRICTIONS: No  FALLS:  Has patient fallen in last 6 months? 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 No  acute findings.   PATIENT SURVEYS:  Patient-Specific Activity Scoring Scheme  "0" represents "unable to perform." "10" represents "able to perform at prior level. 0 1 2 3 4 5 6 7 8 9  10 (Date and Score)   Activity Eval  07/01/23    1. Standing long periods  8    2. Walk longer distances  8    3. Pain or strain in low back 8   4. -   5. -   Score 8    Total score = sum of the activity scores/number of activities Minimum detectable change (90%CI) for average score = 2 points Minimum detectable change (90%CI) for single activity score = 3 points  SCREENING FOR RED FLAGS: Bowel or bladder incontinence: No Cauda equina syndrome: No  COGNITION: Overall cognitive status: WFL normal      SENSATION: WFL  MUSCLE LENGTH: 07/01/23 Hamstrings: Right 48 deg; Left 52 deg   POSTURE:  rounded  shoulders, forward head, and decreased lumbar lordosis  PALPATION: TTP: lumbar paraspinals  LUMBAR ROM:   Directional Preference Assessment: Centralization: Peripheralization:   AROM Eval 07/01/23  Flexion 78  Extension 12  Right lateral flexion 24  Left lateral flexion 18  Right rotation [facility]  Left rotation Limited 50%   (Blank rows = not tested)  LOWER EXTREMITY ROM:     ROM  Right 07/01/23 active Left 07/01/23 active  Hip flexion 102 105  Hip extension    Hip abduction    Hip adduction    Hip internal rotation    Hip external rotation    Knee flexion 110 118  Knee extension -6 0  Ankle dorsiflexion    Ankle plantarflexion    Ankle inversion    Ankle eversion     (Blank rows = not tested)  LOWER EXTREMITY MMT:    MMT Right Eval 07/01/23 Left Eval 07/01/23  Hip flexion 5 5  Hip extension    Hip abduction 5 5  Hip adduction 5 5  Hip internal rotation    Hip external rotation    Knee flexion 5 5  Knee extension 5 5  Ankle dorsiflexion    Ankle plantarflexion    Ankle inversion    Ankle eversion     (Blank rows = not tested)  LUMBAR SPECIAL TESTS:  Straight leg raise test: Negative bil LE's  FUNCTIONAL TESTS:  5 times sit to stand: 12.4 seconds UE support  GAIT: wide BOS, mild antalgic gait due to Rt knee pain  TODAY'S TREATMENT:                                                                                                          DATE: 07/10/22  Therex: HEP review and education Low trunk rotations 5 sec X 10 bilat SKTC stretch 30 sec X 3 Bridges X 10 hold 5 sec Supine hip flexor stretch 30 sec X 3 bilat, leg off EOB Seated hamstring stretch 30 sec X3 bilat Standing lumbar extensions at wall 5 sec X 10 Nu step L5 X 8 min LE/UE, seat  #10 Theractivity: Standing rows green 2X10 Standing extensions green 2X10 Standing anti rotation X 15 bilat     DATE: 07/01/22  Therex:    HEP instruction/performance c cues for techniques, handout provided.  Trial set performed of each for comprehension and symptom assessment.  See below for exercise list   PATIENT EDUCATION:  Education details: HEP, POC Person educated: Patient Education method: Explanation, Demonstration, Verbal cues, and Handouts Education comprehension: verbalized understanding, returned demonstration, and verbal cues required  HOME EXERCISE PROGRAM: Access Code: [email or web address] URL: [email or web address] Date: 07/01/2023 Prepared by: Narda Amber  Exercises - Modified Maisie Fus Stretch  - 2-3 x daily - 7 x weekly - up to 3 minutes hold - Standing Hip Flexor Stretch  - 2-3 x daily - 7 x weekly - 3 reps - 30 seconds hold - Supine Lower Trunk Rotation  - 2-3 x daily - 7 x weekly - 3 reps - 30 seconds hold - Seated Hamstring Stretch  - 2-3 x daily - 7 x weekly - 3 reps - 30 seconds hold - Standing Lumbar Extension at Wall - Forearms  - 2-3 x daily - 7 x weekly - 10 reps - 5-10 seconds hold  ASSESSMENT:  CLINICAL IMPRESSION: Session focused on HEP review and he shows good overall standing. He will continue to benefit from skilled PT to improve function and reduce pain.   OBJECTIVE IMPAIRMENTS: decreased mobility, difficulty walking, decreased ROM, impaired flexibility, and pain.   ACTIVITY LIMITATIONS: lifting, bending, standing, squatting, and transfers  PARTICIPATION LIMITATIONS: community activity  PERSONAL FACTORS: 3+ comorbidities: see PMH above  are also affecting patient's functional outcome.   REHAB POTENTIAL: Good  CLINICAL DECISION MAKING: Stable/uncomplicated  EVALUATION COMPLEXITY: Low   GOALS: Goals reviewed with patient? Yes  SHORT TERM GOALS: (target date for Short term goals are 3 weeks 07/22/2023)  1. Patient will  demonstrate independent use of home exercise program to maintain progress from in clinic treatments.  Goal status: New  LONG TERM GOALS: (target dates for all long term goals are 10 weeks  09/09/2023 )   1. Patient will demonstrate/report pain at worst less than or equal to 2/10 to facilitate minimal limitation in daily activity secondary to pain symptoms.  Goal status: New   2. Patient will demonstrate independent use of home exercise program to facilitate ability to maintain/progress functional gains from skilled physical therapy services.  Goal status: New   3. Patient will demonstrate Patient specific functional scale avg > or = 10 to  indicate reduced disability due to condition.   Goal status: New   4. Patient will demonstrate lumbar extension to >/= 20 degrees with no pain for improvements in functional mobility.   Goal status: New   5.  Pt will be able to lift 15 pounds from floor to counter height using correct body  mechanics with pain </= 2/10.  Goal status: New   6.  Pt will improve his Rt Trunk rotation to [facility]  Goal status: New   PLAN:  PT FREQUENCY: 1-2x/week  PT DURATION: 10 weeks  PLANNED INTERVENTIONS: Can include 16109- PT Re-evaluation, 97110-Therapeutic exercises, 97530- Therapeutic activity, 97112- Neuromuscular re-education, 97535- Self Care, 97140- Manual therapy, [phone]- Gait training, [phone]- Orthotic Fit/training, [phone]- Canalith repositioning, [id number]- Aquatic Therapy, [phone]- Electrical stimulation (unattended), 97750 Physical performance testing, [id number]- Electrical stimulation (manual), 97016- Vasopneumatic device, [id number]- Ultrasound, [id number]- Traction (mechanical), [id number]- Ionotophoresis 4mg /ml Dexamethasone, Patient/Family education, Balance training, Stair training, Taping, Dry Needling, Joint mobilization, Joint manipulation, Spinal manipulation, Spinal mobilization, Scar mobilization, Vestibular training, Visual/preceptual remediation/compensation, DME  instructions, Cryotherapy, and Moist heat.  All performed as medically necessary.  All included unless contraindicated  PLAN FOR NEXT SESSION:  core strengthening, ROM  Ivery Quale, PT, DPT 07/10/23 8:37 AM

## 2023-07-12 ENCOUNTER — Encounter: Payer: Self-pay | Admitting: Physical Therapy

## 2023-07-12 ENCOUNTER — Ambulatory Visit: Admitting: Physical Therapy

## 2023-07-12 DIAGNOSIS — R262 Difficulty in walking, not elsewhere classified: Secondary | ICD-10-CM | POA: Diagnosis not present

## 2023-07-12 DIAGNOSIS — M5459 Other low back pain: Secondary | ICD-10-CM | POA: Diagnosis not present

## 2023-07-12 DIAGNOSIS — M6281 Muscle weakness (generalized): Secondary | ICD-10-CM

## 2023-07-12 NOTE — Therapy (Signed)
 OUTPATIENT PHYSICAL THERAPY TREATMENT   Patient Name: Zachary [name]. MRN: [id number] DOB:May 28, 1955, 68 y.o., male Today's Date: 07/12/2023  END OF SESSION:  PT End of Session - 07/12/23 0813     Visit Number 3    Number of Visits 20    Date for PT Re-Evaluation 09/09/23    PT Start Time 0800    PT Stop Time 0840    PT Time Calculation (min) 40 min    Activity Tolerance Patient tolerated treatment well    Behavior During Therapy WFL for tasks assessed/performed               Past Medical History:  Diagnosis Date   Allergy    Arthritis    Asthma    Blood transfusion without reported diagnosis    as child   Constipation    Heart murmur    mild   Hyperlipidemia    slight elevation   Hypertension    Past Surgical History:  Procedure Laterality Date   APPENDECTOMY     age 58   COLONOSCOPY  2008   KNEE SURGERY Left 1982   pilonidal abcess     x 2 surgeries to correct this   TONSILLECTOMY     as a child   TOTAL KNEE ARTHROPLASTY Left 11/03/2021   Procedure: LEFT TOTAL KNEE ARTHROPLASTY;  Surgeon: Kathryne Hitch, MD;  Location: [facility];  Service: Orthopedics;  Laterality: Left;   Patient Active Problem List   Diagnosis Date Noted   Status post total left knee replacement 11/03/2021   Unilateral primary osteoarthritis, left knee 11/02/2021   OBESITY 05/09/2009   Essential hypertension 05/09/2009   HEMOCCULT POSITIVE STOOL 05/09/2009   HEART MURMUR, HX OF 05/09/2009    PCP: Shade Flood, MD   REFERRING PROVIDER: Ermelinda Das, MD   REFERRING DIAG: [phone] (ICD-10-CM) - Chronic bilateral low back pain without sciatica   Rationale for Evaluation and Treatment: Rehabilitation  THERAPY DIAG:  Difficulty in walking, not elsewhere classified  Other low back pain  Muscle weakness (generalized)  ONSET DATE: about  year  SUBJECTIVE:                                                                                                                                                                                            SUBJECTIVE STATEMENT:   Feeling better already, exercises and PT are helping for sure.  PERTINENT HISTORY:  H/o left TKA, HTN, heart murmur, arthritis, asthma  PAIN:  NPRS scale: 2-3/10 Pain location: low back Pain description: achy, tightness, can be sharp at times, some pinching and fatigue  Aggravating factors: bending, standing longer periods, walking long distances Relieving factors: changing positions, over the counter meds as needed  PRECAUTIONS: None  WEIGHT BEARING RESTRICTIONS: No  FALLS:  Has patient fallen in last 6 months? 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 No  acute findings.   PATIENT SURVEYS:  Patient-Specific Activity Scoring Scheme  "0" represents "unable to perform." "10" represents "able to perform at prior level. 0 1 2 3 4 5 6 7 8 9  10 (Date and Score)   Activity Eval  07/01/23    1. Standing long periods  8    2. Walk longer distances  8    3. Pain or strain in low back 8   4. -   5. -   Score 8    Total score = sum of the activity scores/number of activities Minimum detectable change (90%CI) for average score = 2 points Minimum detectable change (90%CI) for single activity score = 3 points  SCREENING FOR RED FLAGS: Bowel or bladder incontinence: No Cauda equina syndrome: No  COGNITION: Overall cognitive status: WFL normal      SENSATION: WFL  MUSCLE LENGTH: 07/01/23 Hamstrings: Right 48 deg; Left 52 deg   POSTURE:  rounded  shoulders, forward head, and decreased lumbar lordosis  PALPATION: TTP: lumbar paraspinals  LUMBAR ROM:   Directional Preference Assessment: Centralization: Peripheralization:   AROM Eval 07/01/23  Flexion 78  Extension 12  Right lateral flexion 24  Left lateral flexion 18  Right rotation [facility]  Left rotation Limited 50%   (Blank rows = not tested)  LOWER EXTREMITY ROM:     ROM  Right 07/01/23 active Left 07/01/23 active  Hip flexion 102 105  Hip extension    Hip abduction    Hip adduction    Hip internal rotation    Hip external rotation    Knee flexion 110 118  Knee extension -6 0  Ankle dorsiflexion    Ankle plantarflexion    Ankle inversion    Ankle eversion     (Blank rows = not tested)  LOWER EXTREMITY MMT:    MMT Right Eval 07/01/23 Left Eval 07/01/23  Hip flexion 5 5  Hip extension    Hip abduction 5 5  Hip adduction 5 5  Hip internal rotation    Hip external rotation    Knee flexion 5 5  Knee extension 5 5  Ankle dorsiflexion    Ankle plantarflexion    Ankle inversion    Ankle eversion     (Blank rows = not tested)  LUMBAR SPECIAL TESTS:  Straight leg raise test: Negative bil LE's  FUNCTIONAL TESTS:  5 times sit to stand: 12.4 seconds UE support  GAIT: wide BOS, mild antalgic gait due to Rt knee pain  TODAY'S TREATMENT:                                                                                                          DATE:   07/12/23   Nustep L5x6 minutes BLEs for w/u and tissue perfusion Standing hip hikes x12 B Standing hip hikes + ABD x12 B   Lumbar rotation stretch 5x5 seconds B Figure 4 stretch supine 2x20 seconds  PPT 12x3 second holds PPT + march x12 B Bridges x12  Hip flexor stretch off edge of table 2x30  seconds B Standing lumbar 3D excursions x10 each direction QL stretch forward with stool 3x30 seconds L stretch at counter 3x15 seconds       07/10/22  Therex: HEP review and education Low trunk rotations 5 sec X 10 bilat SKTC stretch 30 sec X 3 Bridges X 10 hold 5 sec Supine hip flexor stretch 30 sec X 3 bilat, leg off EOB Seated hamstring stretch 30 sec X3 bilat Standing lumbar extensions at wall 5 sec X 10 Nu step L5 X 8 min LE/UE, seat #10 Theractivity: Standing rows green 2X10 Standing extensions green 2X10 Standing anti rotation X 15 bilat     DATE: 07/01/22  Therex:    HEP instruction/performance c cues for techniques, handout provided.  Trial set performed of each for comprehension and symptom assessment.  See below for exercise list   PATIENT EDUCATION:  Education details: HEP, POC Person educated: Patient Education method: Explanation, Demonstration, Verbal cues, and Handouts Education comprehension: verbalized understanding, returned demonstration, and verbal cues required  HOME EXERCISE PROGRAM: Access Code: [email or web address] URL: [email or web address] Date: 07/01/2023 Prepared by: Narda Amber  Exercises - Modified Maisie Fus Stretch  - 2-3 x daily - 7 x weekly - up to 3 minutes hold - Standing Hip Flexor Stretch  - 2-3 x daily - 7 x weekly - 3 reps - 30 seconds hold - Supine Lower Trunk Rotation  - 2-3 x daily - 7 x weekly - 3 reps - 30 seconds hold - Seated Hamstring Stretch  - 2-3 x daily - 7 x weekly - 3 reps - 30 seconds hold - Standing Lumbar Extension at Wall - Forearms  - 2-3 x daily - 7 x weekly - 10 reps - 5-10 seconds hold  ASSESSMENT:  CLINICAL IMPRESSION:   Pt arrives today doing better, continues to feel that PT and HEP have been pretty beneficial so far. Continued to progress all activities and exercises to tolerance with increased focus on core today. Very motivated to improve.     OBJECTIVE IMPAIRMENTS: decreased mobility,  difficulty walking, decreased ROM, impaired flexibility, and pain.   ACTIVITY LIMITATIONS: lifting, bending, standing, squatting, and transfers  PARTICIPATION LIMITATIONS: community activity  PERSONAL FACTORS: 3+ comorbidities: see PMH above  are also affecting patient's functional outcome.   REHAB POTENTIAL: Good  CLINICAL DECISION MAKING: Stable/uncomplicated  EVALUATION COMPLEXITY: Low   GOALS: Goals reviewed with patient? Yes  SHORT TERM GOALS: (target date for Short term goals are 3 weeks 07/22/2023)  1. Patient will demonstrate independent use of  home exercise program to maintain progress from in clinic treatments.  Goal status: New  LONG TERM GOALS: (target dates for all long term goals are 10 weeks  09/09/2023 )   1. Patient will demonstrate/report pain at worst less than or equal to 2/10 to facilitate minimal limitation in daily activity secondary to pain symptoms.  Goal status: New   2. Patient will demonstrate independent use of home exercise program to facilitate ability to maintain/progress functional gains from skilled physical therapy services.  Goal status: New   3. Patient will demonstrate Patient specific functional scale avg > or = 10 to indicate reduced disability due to condition.   Goal status: New   4. Patient will demonstrate lumbar extension to >/= 20 degrees with no pain for improvements in functional mobility.   Goal status: New   5.  Pt will be able to lift 15 pounds from floor to counter height using correct body  mechanics with pain </= 2/10.  Goal status: New   6.  Pt will improve his Rt Trunk rotation to [facility]  Goal status: New   PLAN:  PT FREQUENCY: 1-2x/week  PT DURATION: 10 weeks  PLANNED INTERVENTIONS: Can include 16109- PT Re-evaluation, 97110-Therapeutic exercises, 97530- Therapeutic activity, 97112- Neuromuscular re-education, 97535- Self Care, 97140- Manual therapy, [phone]- Gait training, [phone]- Orthotic Fit/training, [phone]-  Canalith repositioning, [id number]- Aquatic Therapy, [phone]- Electrical stimulation (unattended), 97750 Physical performance testing, [id number]- Electrical stimulation (manual), 97016- Vasopneumatic device, [id number]- Ultrasound, [id number]- Traction (mechanical), [id number]- Ionotophoresis 4mg /ml Dexamethasone, Patient/Family education, Balance training, Stair training, Taping, Dry Needling, Joint mobilization, Joint manipulation, Spinal manipulation, Spinal mobilization, Scar mobilization, Vestibular training, Visual/preceptual remediation/compensation, DME instructions, Cryotherapy, and Moist heat.  All performed as medically necessary.  All included unless contraindicated  PLAN FOR NEXT SESSION:  core strengthening, ROM, regular HEP updates   Nedra Hai, PT, DPT 07/12/23 8:40 AM

## 2023-07-15 ENCOUNTER — Encounter: Admitting: Physical Therapy

## 2023-07-19 ENCOUNTER — Ambulatory Visit: Admitting: Physical Therapy

## 2023-07-19 ENCOUNTER — Encounter: Payer: Self-pay | Admitting: Physical Therapy

## 2023-07-19 DIAGNOSIS — M6281 Muscle weakness (generalized): Secondary | ICD-10-CM

## 2023-07-19 DIAGNOSIS — M5459 Other low back pain: Secondary | ICD-10-CM

## 2023-07-19 DIAGNOSIS — R262 Difficulty in walking, not elsewhere classified: Secondary | ICD-10-CM | POA: Diagnosis not present

## 2023-07-19 NOTE — Therapy (Signed)
 OUTPATIENT PHYSICAL THERAPY TREATMENT   Patient Name: Zachary [name]. MRN: [id number] DOB:Jun 26, 1955, 68 y.o., male Today's Date: 07/19/2023  END OF SESSION:  PT End of Session - 07/19/23 0812     Visit Number 4    Number of Visits 20    Date for PT Re-Evaluation 09/09/23    PT Start Time 0800    PT Stop Time 0838    PT Time Calculation (min) 38 min    Activity Tolerance Patient tolerated treatment well    Behavior During Therapy [facility] for tasks assessed/performed                Past Medical History:  Diagnosis Date   Allergy    Arthritis    Asthma    Blood transfusion without reported diagnosis    as child   Constipation    Heart murmur    mild   Hyperlipidemia    slight elevation   Hypertension    Past Surgical History:  Procedure Laterality Date   APPENDECTOMY     age 32   COLONOSCOPY  2008   KNEE SURGERY Left 1982   pilonidal abcess     x 2 surgeries to correct this   TONSILLECTOMY     as a child   TOTAL KNEE ARTHROPLASTY Left 11/03/2021   Procedure: LEFT TOTAL KNEE ARTHROPLASTY;  Surgeon: Kathryne Hitch, MD;  Location: [facility];  Service: Orthopedics;  Laterality: Left;   Patient Active Problem List   Diagnosis Date Noted   Status post total left knee replacement 11/03/2021   Unilateral primary osteoarthritis, left knee 11/02/2021   OBESITY 05/09/2009   Essential hypertension 05/09/2009   HEMOCCULT POSITIVE STOOL 05/09/2009   HEART MURMUR, HX OF 05/09/2009    PCP: Shade Flood, MD   REFERRING PROVIDER: Ermelinda Das, MD   REFERRING DIAG: [phone] (ICD-10-CM) - Chronic bilateral low back pain without sciatica   Rationale for Evaluation and Treatment: Rehabilitation  THERAPY DIAG:  Difficulty in walking, not elsewhere classified  Other low back pain  Muscle weakness (generalized)  ONSET DATE: about  year  SUBJECTIVE:                                                                                                                                                                                            SUBJECTIVE STATEMENT:  Doing better, co-pay is high so I need to drop to once a week    PERTINENT HISTORY:  H/o left TKA, HTN, heart murmur, arthritis, asthma  PAIN:  NPRS scale: 1-2/10 Pain location: low back Pain description: stiffness in certain positions  Aggravating  factors: bending, standing longer periods, walking long distances Relieving factors: changing positions, over the counter meds as needed  PRECAUTIONS: None  WEIGHT BEARING RESTRICTIONS: No  FALLS:  Has patient fallen in last 6 months? 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 No  acute findings.   PATIENT SURVEYS:  Patient-Specific Activity Scoring Scheme  "0" represents "unable to perform." "10" represents "able to perform at prior level. 0 1 2 3 4 5 6 7 8 9  10 (Date and Score)   Activity Eval  07/01/23  07/19/23  1. Standing long periods  8 5   2. Walk longer distances  8 5   3. Pain or strain in low back 8 8  4. - -  5. - -  Score 8 6   Total score = sum of the activity scores/number of activities Minimum detectable change (90%CI) for average score = 2 points Minimum detectable change (90%CI) for single activity score = 3 points  SCREENING FOR RED FLAGS: Bowel or bladder incontinence: No Cauda equina syndrome: No  COGNITION: Overall cognitive status: WFL normal      SENSATION: WFL  MUSCLE LENGTH: 07/01/23 Hamstrings: Right 48 deg; Left 52 deg   POSTURE:  rounded shoulders, forward  head, and decreased lumbar lordosis  PALPATION: TTP: lumbar paraspinals  LUMBAR ROM:   Directional Preference Assessment: Centralization: Peripheralization:   AROM Eval 07/01/23  Flexion 78  Extension 12  Right lateral flexion 24  Left lateral flexion 18  Right rotation [facility]  Left rotation Limited 50%   (Blank rows = not tested)  LOWER EXTREMITY ROM:     ROM  Right 07/01/23 active Left 07/01/23 active  Hip flexion 102 105  Hip extension    Hip abduction    Hip adduction    Hip internal rotation    Hip external rotation    Knee flexion 110 118  Knee extension -6 0  Ankle dorsiflexion    Ankle plantarflexion    Ankle inversion    Ankle eversion     (Blank rows = not tested)  LOWER EXTREMITY MMT:    MMT Right Eval 07/01/23 Left Eval 07/01/23  Hip flexion 5 5  Hip extension    Hip abduction 5 5  Hip adduction 5 5  Hip internal rotation    Hip external rotation    Knee flexion 5 5  Knee extension 5 5  Ankle dorsiflexion    Ankle plantarflexion    Ankle inversion    Ankle eversion     (Blank rows = not tested)  LUMBAR SPECIAL TESTS:  Straight leg raise test: Negative bil LE's  FUNCTIONAL TESTS:  5 times sit to stand: 12.4 seconds UE support  GAIT: wide BOS, mild antalgic gait due to Rt knee pain  TODAY'S TREATMENT:                                                                                                          DATE:    07/19/23  Nustep L5x8 minutes BLEs only for w/u and tissue perfusion   TA sets 15x3 second holds TA + march x15  TA set + opposite UE/LE flexion x10 B Curl ups neutral lx spine x15  Bridge in staggered stance x10 B  QL stretch 6x30 (3 way, 2 rounds each direction) Seated TA set + march x10  Lateral trunk crunches x10  B feet elevated off the ground  Seated TA set + march + rotation x10 total (alternating)   07/12/23   Nustep L5x6 minutes BLEs for w/u and tissue perfusion Standing hip hikes x12 B Standing hip hikes + ABD x12 B   Lumbar rotation stretch 5x5 seconds B Figure 4 stretch supine 2x20 seconds  PPT 12x3 second holds PPT + march x12 B Bridges x12  Hip flexor stretch off edge of table 2x30 seconds B Standing lumbar 3D excursions x10 each direction QL stretch forward with stool 3x30 seconds L stretch at counter 3x15 seconds        PATIENT EDUCATION:  Education details: HEP, POC Person educated: Patient Education method: Programmer, multimedia, Demonstration, Verbal cues, and Handouts Education comprehension: verbalized understanding, returned demonstration, and verbal cues required  HOME EXERCISE PROGRAM:  Access Code: [email or web address] URL: [email or web address] Date: 07/19/2023 Prepared by: Nedra Hai  Exercises - Modified Thomas Stretch  - 2-3 x daily - 7 x weekly - up to 3 minutes hold - Standing Hip Flexor Stretch  - 2-3 x daily - 7 x weekly - 3 reps - 30 seconds hold - Supine Lower Trunk Rotation  - 2-3 x daily - 7 x weekly - 3 reps - 30 seconds hold - Seated Hamstring Stretch  - 2-3 x daily - 7 x weekly - 3 reps - 30 seconds hold - Standing Lumbar Extension at Wall - Forearms  - 2-3 x daily - 7 x weekly - 10 reps - 5-10 seconds hold - Supine Transversus Abdominis Bracing - Hands on Stomach  - 2-3 x daily - 7 x weekly - 1 sets - 10 reps - 3 seconds  hold - Supine March with Alternating Leg Lifts  - 1-2 x daily - 7 x weekly - 1 sets - 10 reps - Curl Up with Reach  - 1-2 x daily - 7 x weekly - 1 sets - 10 reps - 1 second  hold  ASSESSMENT:  CLINICAL IMPRESSION:   Pt arrives today doing well, he continues to improve but requests to drop to 1x/week due to high co-pay. He is compliant with HEP at home and progressing nicely so I think this is certainly reasonable. Updated  HEP today, will continue to monitor progress and goals closely. Updated PSFS today with reduced score noted but he does otherwise report significant improvements in symptoms and pain.     OBJECTIVE IMPAIRMENTS: decreased mobility, difficulty walking, decreased ROM, impaired flexibility, and  pain.   ACTIVITY LIMITATIONS: lifting, bending, standing, squatting, and transfers  PARTICIPATION LIMITATIONS: community activity  PERSONAL FACTORS: 3+ comorbidities: see PMH above  are also affecting patient's functional outcome.   REHAB POTENTIAL: Good  CLINICAL DECISION MAKING: Stable/uncomplicated  EVALUATION COMPLEXITY: Low   GOALS: Goals reviewed with patient? Yes  SHORT TERM GOALS: (target date for Short term goals are 3 weeks 07/22/2023)  1. Patient will demonstrate independent use of home exercise program to maintain progress from in clinic treatments.  Goal status: MET 07/19/23  LONG TERM GOALS: (target dates for all long term goals are 10 weeks  09/09/2023 )   1. Patient will demonstrate/report pain at worst less than or equal to 2/10 to facilitate minimal limitation in daily activity secondary to pain symptoms.  Goal status: New   2. Patient will demonstrate independent use of home exercise program to facilitate ability to maintain/progress functional gains from skilled physical therapy services.  Goal status: New   3. Patient will demonstrate Patient specific functional scale avg > or = 10 to indicate reduced disability due to condition.   Goal status: New   4. Patient will demonstrate lumbar extension to >/= 20 degrees with no pain for improvements in functional mobility.   Goal status: New   5.  Pt will be able to lift 15 pounds from floor to counter height using correct body  mechanics with pain </= 2/10.  Goal status: New   6.  Pt will improve his Rt Trunk rotation to [facility]  Goal status: New   PLAN:  PT FREQUENCY: 1-2x/week  PT DURATION: 10 weeks  PLANNED  INTERVENTIONS: Can include 95621- PT Re-evaluation, 97110-Therapeutic exercises, 97530- Therapeutic activity, 97112- Neuromuscular re-education, 97535- Self Care, 97140- Manual therapy, [phone]- Gait training, [phone]- Orthotic Fit/training, [phone]- Canalith repositioning, [id number]- Aquatic Therapy, [phone]- Electrical stimulation (unattended), 97750 Physical performance testing, [id number]- Electrical stimulation (manual), 97016- Vasopneumatic device, [id number]- Ultrasound, [id number]- Traction (mechanical), [id number]- Ionotophoresis 4mg /ml Dexamethasone, Patient/Family education, Balance training, Stair training, Taping, Dry Needling, Joint mobilization, Joint manipulation, Spinal manipulation, Spinal mobilization, Scar mobilization, Vestibular training, Visual/preceptual remediation/compensation, DME instructions, Cryotherapy, and Moist heat.  All performed as medically necessary.  All included unless contraindicated  PLAN FOR NEXT SESSION:  core strengthening, ROM, regular HEP updates as he is only coming 1x/week at this point due to co-pay   Nedra Hai, PT, DPT 07/19/23 8:39 AM

## 2023-07-22 ENCOUNTER — Encounter: Admitting: Physical Therapy

## 2023-07-24 ENCOUNTER — Encounter: Admitting: Physical Therapy

## 2023-07-24 ENCOUNTER — Telehealth: Payer: Self-pay | Admitting: Physical Therapy

## 2023-07-24 NOTE — Telephone Encounter (Signed)
 I called pt to follow up after he missed his 8:00 PT appointment this morning. Pt stating he woke up early this morning with "stomach bug". Pt stated he tried to call and cancel but wasn't able to get through. Pt was reminded of his next appointment on Wed 07/31/23 at 8:00 am.   Narda Amber, PT MPT 07/24/23 8:47 AM

## 2023-07-26 ENCOUNTER — Ambulatory Visit: Payer: BC Managed Care – PPO | Admitting: Family Medicine

## 2023-07-29 ENCOUNTER — Encounter: Admitting: Physical Therapy

## 2023-07-31 ENCOUNTER — Encounter: Payer: Self-pay | Admitting: Physical Therapy

## 2023-07-31 ENCOUNTER — Ambulatory Visit: Admitting: Physical Therapy

## 2023-07-31 DIAGNOSIS — M6281 Muscle weakness (generalized): Secondary | ICD-10-CM

## 2023-07-31 DIAGNOSIS — R262 Difficulty in walking, not elsewhere classified: Secondary | ICD-10-CM

## 2023-07-31 DIAGNOSIS — M5459 Other low back pain: Secondary | ICD-10-CM

## 2023-07-31 NOTE — Therapy (Signed)
 OUTPATIENT PHYSICAL THERAPY TREATMENT   Patient Name: Zachary [name]. MRN: [id number] DOB:Nov 28, 1955, 68 y.o., male Today's Date: 07/31/2023  END OF SESSION:  PT End of Session - 07/31/23 0858     Visit Number 5    Number of Visits 20    Date for PT Re-Evaluation 09/09/23    PT Start Time 0801    PT Stop Time 0842    PT Time Calculation (min) 41 min    Activity Tolerance Patient tolerated treatment well    Behavior During Therapy [facility] for tasks assessed/performed                 Past Medical History:  Diagnosis Date   Allergy    Arthritis    Asthma    Blood transfusion without reported diagnosis    as child   Constipation    Heart murmur    mild   Hyperlipidemia    slight elevation   Hypertension    Past Surgical History:  Procedure Laterality Date   APPENDECTOMY     age 25   COLONOSCOPY  2008   KNEE SURGERY Left 1982   pilonidal abcess     x 2 surgeries to correct this   TONSILLECTOMY     as a child   TOTAL KNEE ARTHROPLASTY Left 11/03/2021   Procedure: LEFT TOTAL KNEE ARTHROPLASTY;  Surgeon: Kathryne Hitch, MD;  Location: [facility];  Service: Orthopedics;  Laterality: Left;   Patient Active Problem List   Diagnosis Date Noted   Status post total left knee replacement 11/03/2021   Unilateral primary osteoarthritis, left knee 11/02/2021   OBESITY 05/09/2009   Essential hypertension 05/09/2009   HEMOCCULT POSITIVE STOOL 05/09/2009   HEART MURMUR, HX OF 05/09/2009    PCP: Shade Flood, MD   REFERRING PROVIDER: Shade Flood, MD   REFERRING DIAG: [phone] (ICD-10-CM) - Chronic bilateral low back pain without sciatica   Rationale for Evaluation and Treatment: Rehabilitation  THERAPY DIAG:  Difficulty in walking, not elsewhere classified  Other low back pain  Muscle weakness (generalized)  ONSET DATE: about  year  SUBJECTIVE:                                                                                                                                                                                            SUBJECTIVE STATEMENT: Pt arriving today 3/10 pain in his low back.    PERTINENT HISTORY:  H/o left TKA, HTN, heart murmur, arthritis, asthma  PAIN:  NPRS scale: 3/10 Pain location: low back Pain description: stiffness in certain positions  Aggravating factors: bending, standing longer periods,  walking long distances Relieving factors: changing positions, over the counter meds as needed  PRECAUTIONS: None  WEIGHT BEARING RESTRICTIONS: No  FALLS:  Has patient fallen in last 6 months? 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 No  acute findings.   PATIENT SURVEYS:  Patient-Specific Activity Scoring Scheme  "0" represents "unable to perform." "10" represents "able to perform at prior level. 0 1 2 3 4 5 6 7 8 9  10 (Date and Score)   Activity Eval  07/01/23  07/19/23  1. Standing long periods  8 5   2. Walk longer distances  8 5   3. Pain or strain in low back 8 8  4. - -  5. - -  Score 8 6   Total score = sum of the activity scores/number of activities Minimum detectable change (90%CI) for average score = 2 points Minimum detectable change (90%CI) for single activity score = 3 points  SCREENING FOR RED FLAGS: Bowel or bladder incontinence: No Cauda equina syndrome: No  COGNITION: Overall cognitive status: WFL normal      SENSATION: WFL  MUSCLE LENGTH: 07/01/23 Hamstrings: Right 48 deg; Left 52 deg   POSTURE:  rounded shoulders, forward head, and decreased  lumbar lordosis  PALPATION: TTP: lumbar paraspinals  LUMBAR ROM:  Directional Preference Assessment: Centralization: Peripheralization:   AROM Eval 07/01/23 07/31/23  Flexion 78 76  Extension 12 12  Right lateral flexion 24   Left lateral flexion 18   Right rotation [facility] WFL  Left rotation Limited 50% Limited 25%   (Blank rows = not tested)  LOWER EXTREMITY ROM:     ROM  Right 07/01/23 active Left 07/01/23 active  Hip flexion 102 105  Hip extension    Hip abduction    Hip adduction    Hip internal rotation    Hip external rotation    Knee flexion 110 118  Knee extension -6 0  Ankle dorsiflexion    Ankle plantarflexion    Ankle inversion    Ankle eversion     (Blank rows = not tested)  LOWER EXTREMITY MMT:    MMT Right Eval 07/01/23 Left Eval 07/01/23  Hip flexion 5 5  Hip extension    Hip abduction 5 5  Hip adduction 5 5  Hip internal rotation    Hip external rotation    Knee flexion 5 5  Knee extension 5 5  Ankle dorsiflexion    Ankle plantarflexion    Ankle inversion    Ankle eversion     (Blank rows = not tested)  LUMBAR SPECIAL TESTS:  Straight leg raise test: Negative bil LE's  FUNCTIONAL TESTS:  5 times sit to stand: 12.4 seconds UE support  GAIT: wide BOS, mild antalgic gait due to Rt knee pain  TODAY'S TREATMENT:                                                                                                          DATE:  07/30/23:  TherEx:  Nustep: level 6 x 8 minutes Rows: level 4 x 20 holding 3 sec Standing QL stretch: x 3 bil holding 20 sec Supine SKTC: x 2 bil holding 30 sec Dead bug 2 x 10 c TA activation Bridge: feet apart for comfort 2 x 10 holding 5 sec Trunk rotation x 3 bil LE holding 20 sec Prone on elbows: x 2  minutes TherActivites:  Lifting 15# from floor to counter height Pt edu in turning while lifting and carrying 15# object     07/19/23  Nustep L5x8 minutes BLEs only for w/u and tissue perfusion   TA sets 15x3 second holds TA + march x15  TA set + opposite UE/LE flexion x10 B Curl ups neutral lx spine x15  Bridge in staggered stance x10 B  QL stretch 6x30 (3 way, 2 rounds each direction) Seated TA set + march x10  Lateral trunk crunches x10 B feet elevated off the ground  Seated TA set + march + rotation x10 total (alternating)   07/12/23  Nustep L5x6 minutes BLEs for w/u and tissue perfusion Standing hip hikes x12 B Standing hip hikes + ABD x12 B   Lumbar rotation stretch 5x5 seconds B Figure 4 stretch supine 2x20 seconds  PPT 12x3 second holds PPT + march x12 B Bridges x12  Hip flexor stretch off edge of table 2x30 seconds B Standing lumbar 3D excursions x10 each direction QL stretch forward with stool 3x30 seconds L stretch at counter 3x15 seconds        PATIENT EDUCATION:  Education details: HEP, POC Person educated: Patient Education method: Programmer, multimedia, Demonstration, Verbal cues, and Handouts Education comprehension: verbalized understanding, returned demonstration, and verbal cues required  HOME EXERCISE PROGRAM:  Access Code: [email or web address] URL: [email or web address] Date: 07/31/2023 Prepared by: Narda Amber  Exercises - Modified Maisie Fus Stretch  - 2-3 x daily - 7 x weekly - up to 3 minutes hold - Standing Hip Flexor Stretch  - 2-3 x daily - 7 x weekly - 3 reps - 30 seconds hold - Supine Lower Trunk Rotation  - 2-3 x daily - 7 x weekly - 3 reps - 30 seconds hold - Seated Hamstring Stretch  - 2-3 x daily - 7 x weekly - 3 reps - 30 seconds hold - Standing Lumbar Extension at Wall - Forearms  - 2-3 x daily - 7 x weekly - 10 reps - 5-10 seconds hold - Supine Transversus Abdominis Bracing - Hands on Stomach  - 2-3 x daily - 7 x weekly - 1  sets - 10 reps - 3 seconds  hold - Supine March with Alternating Leg Lifts  - 1-2 x daily - 7 x weekly - 1 sets - 10 reps - Curl Up with Reach  - 1-2 x daily - 7 x weekly - 1 sets - 10 reps - 1 second  hold - Standing Shoulder Row with Anchored Resistance  - 1 x daily - 7 x weekly - 3 sets - 10 reps - 3 seconds hold - Standing Quadratus Lumborum Stretch with Doorway  - 1 x daily - 7 x weekly - 3 reps - 20 seconds hold  ASSESSMENT:  CLINICAL IMPRESSION: Pt reporting 3/10 pain today upon arrival in his low back. Pt stating compliance in his HEP. Pt's HEP was updated this visit and handout issued. Pt tolerating all exercises well focusing on core strengthening. Recommending continued skilled interventions.     OBJECTIVE IMPAIRMENTS: decreased mobility, difficulty walking, decreased ROM, impaired flexibility, and pain.   ACTIVITY LIMITATIONS: lifting, bending, standing, squatting, and transfers  PARTICIPATION LIMITATIONS: community activity  PERSONAL FACTORS: 3+ comorbidities: see PMH above  are also affecting patient's functional outcome.   REHAB POTENTIAL: Good  CLINICAL DECISION MAKING: Stable/uncomplicated  EVALUATION COMPLEXITY: Low   GOALS: Goals reviewed with patient? Yes  SHORT TERM GOALS: (target date for Short term goals are 3 weeks 07/22/2023)  1. Patient will demonstrate independent use of home exercise program to maintain progress from in clinic treatments.  Goal status: MET 07/19/23  LONG TERM GOALS: (target dates for all long term goals are 10 weeks  09/09/2023 )   1. Patient will demonstrate/report pain at worst less than or equal to 2/10 to facilitate minimal limitation in daily activity secondary to pain symptoms.  Goal status: New   2. Patient will demonstrate independent use of home exercise program to facilitate ability to maintain/progress functional gains from skilled physical therapy services.  Goal status: New   3. Patient will demonstrate Patient  specific functional scale avg > or = 10 to indicate reduced disability due to condition.   Goal status: New   4. Patient will demonstrate lumbar extension to >/= 20 degrees with no pain for improvements in functional mobility.   Goal status: on-going 07/31/23   5.  Pt will be able to lift 15 pounds from floor to counter height using correct body  mechanics with pain </= 2/10.  Goal status: Pt able to perform however pain 3/10 On-going 07/31/23   6.  Pt will improve his Rt Trunk rotation to [facility]  Goal status: New   PLAN:  PT FREQUENCY: 1-2x/week  PT DURATION: 10 weeks  PLANNED INTERVENTIONS: Can include 78295- PT Re-evaluation, 97110-Therapeutic exercises, 97530- Therapeutic activity, 97112- Neuromuscular re-education, 97535- Self Care, 97140- Manual therapy, [phone]- Gait training, [phone]- Orthotic Fit/training, [phone]- Canalith repositioning, [id number]- Aquatic Therapy, [phone]- Electrical stimulation (unattended), 97750 Physical performance testing, [id number]- Electrical stimulation (manual), 97016- Vasopneumatic device, [id number]- Ultrasound, [id number]- Traction (mechanical), [id number]- Ionotophoresis 4mg /ml Dexamethasone, Patient/Family education, Balance training, Stair training, Taping, Dry Needling, Joint mobilization, Joint manipulation, Spinal manipulation, Spinal mobilization, Scar mobilization, Vestibular training, Visual/preceptual remediation/compensation, DME instructions, Cryotherapy, and Moist heat.  All performed as medically necessary.  All included unless contraindicated  PLAN FOR NEXT SESSION:  core strengthening, ROM, regular HEP updates as he is only coming 1x/week at this point due to co-pay   Narda Amber, PT, MPT 07/31/23 8:59 AM   07/31/23 8:59 AM

## 2023-08-09 ENCOUNTER — Encounter: Admitting: Physical Therapy

## 2023-08-09 ENCOUNTER — Telehealth: Payer: Self-pay | Admitting: Physical Therapy

## 2023-08-09 ENCOUNTER — Encounter: Payer: Self-pay | Admitting: Physical Therapy

## 2023-08-09 NOTE — Telephone Encounter (Signed)
 Pt missed PT appt this morning- called and spoke to him directly, he did try to cancel this appt online yesterday. He relays he is feeling much better with established HEP and would actually like to be discharged at this time. Educated that he is welcome to return to PT with new MD referral if new needs arise.  Nedra Hai, PT, DPT 08/09/23 8:22 AM

## 2023-08-09 NOTE — Therapy (Signed)
 PHYSICAL THERAPY DISCHARGE SUMMARY  Visits from Start of Care: 5  Current functional level related to goals / functional outcomes: Pt requests DC over the phone due to having had great outcomes and pain relief from PT, unable to physically assess goals yet unmet    Remaining deficits: Unable to assess    Education / Equipment: Unable to assess    Patient agrees to discharge. Patient goals were partially met. Patient is being discharged due to being pleased with the current functional level.   Nedra Hai, PT, DPT 08/09/23 8:25 AM

## 2023-08-26 ENCOUNTER — Ambulatory Visit: Admitting: Family Medicine

## 2023-08-26 VITALS — BP 136/80 | HR 103 | Temp 97.8°F | Ht 73.0 in | Wt 308.0 lb

## 2023-08-26 DIAGNOSIS — E1169 Type 2 diabetes mellitus with other specified complication: Secondary | ICD-10-CM | POA: Diagnosis not present

## 2023-08-26 DIAGNOSIS — E785 Hyperlipidemia, unspecified: Secondary | ICD-10-CM | POA: Diagnosis not present

## 2023-08-26 DIAGNOSIS — E119 Type 2 diabetes mellitus without complications: Secondary | ICD-10-CM

## 2023-08-26 DIAGNOSIS — I10 Essential (primary) hypertension: Secondary | ICD-10-CM | POA: Diagnosis not present

## 2023-08-26 DIAGNOSIS — G8929 Other chronic pain: Secondary | ICD-10-CM

## 2023-08-26 DIAGNOSIS — M545 Low back pain, unspecified: Secondary | ICD-10-CM | POA: Diagnosis not present

## 2023-08-26 DIAGNOSIS — E669 Obesity, unspecified: Secondary | ICD-10-CM

## 2023-08-26 NOTE — Patient Instructions (Addendum)
 Glad to hear the back pain has improved. Keep up with the home exercises.  Lab visit in 1 month.  Keep up the good work with staying active and watching diet. I will see you in 4 months. Take care.   Type 2 Diabetes Mellitus, Self-Care, Adult Caring for yourself after you have been diagnosed with type 2 diabetes (type 2 diabetes mellitus) means keeping your blood sugar (glucose) under control with a balance of: Nutrition. Exercise. Lifestyle changes. Medicines or insulin, if needed. Support from your team of health care providers and others. What are the risks? Having type 2 diabetes can put you at risk for other long-term (chronic) conditions, such as heart disease and kidney disease. Your health care provider may prescribe medicines to help prevent complications from diabetes. How to monitor your blood glucose  Check your blood glucose every day or as often as told by your health care provider. Have your A1C (hemoglobin A1C) level checked two or more times a year, or as often as told by your health care provider. Your health care provider will set personalized treatment goals for you. Generally, the goal of treatment is to maintain the following blood glucose levels: Before meals: 80-130 mg/dL (4.4-7.2 mmol/L). After meals: below 180 mg/dL (10 mmol/L). A1C level: less than 7%. How to manage hyperglycemia and hypoglycemia Hyperglycemia symptoms Hyperglycemia, also called high blood glucose, occurs when blood glucose is too high. Make sure you know the early signs of hyperglycemia, such as: Increased thirst. Hunger. Feeling very tired. Needing to urinate more often than usual. Blurry vision. Hypoglycemia symptoms Hypoglycemia, also called low blood glucose, occurs with a blood glucose level at or below 70 mg/dL (3.9 mmol/L). Diabetes medicines lower your blood glucose and can cause hypoglycemia. The risk for hypoglycemia increases during or after exercise, during sleep, during illness,  and when skipping meals or not eating for a long time (fasting). It is important to know the symptoms of hypoglycemia and treat it right away. Always have a 15-gram rapid-acting carbohydrate snack with you to treat low blood glucose. Family members and close friends should also know the symptoms and understand how to treat hypoglycemia, in case you are not able to treat yourself. Symptoms may include: Hunger. Anxiety. Sweating and feeling clammy. Dizziness or feeling light-headed. Sleepiness. Increased heart rate. Irritability. Tingling or numbness around the mouth, lips, or tongue. Restless sleep. Severe hypoglycemia is when your blood glucose level is at or below 54 mg/dL (3 mmol/L). Severe hypoglycemia is an emergency. Do not wait to see if the symptoms will go away. Get medical help right away. Call your local emergency services (911 in the [place] Do not drive yourself to the hospital. If you have severe hypoglycemia and you cannot eat or drink, you may need glucagon. A family member or close friend should learn how to check your blood glucose and how to give you glucagon. Ask your health care provider if you need to have an emergency glucagon kit available. Follow these instructions at home: Medicines Take prescribed insulin or diabetes medicines as told by your health care provider. Do not run out of insulin or other diabetes medicines. Plan ahead so you always have these available. If you use insulin, adjust your dosage based on your physical activity and what foods you eat. Your health care provider will tell you how to adjust your dosage. Take over-the-counter and prescription medicines only as told by your health care provider. Eating and drinking  What you eat and drink affects  your blood glucose and your insulin dosage. Making good choices helps to control your diabetes and prevent other health problems. A healthy meal plan includes eating lean proteins, complex carbohydrates, fresh  fruits and vegetables, low-fat dairy products, and healthy fats. Make an appointment to see a registered dietitian to help you create an eating plan that is right for you. Make sure that you: Follow instructions from your health care provider about eating or drinking restrictions. Drink enough fluid to keep your urine pale yellow. Keep a record of the carbohydrates that you eat. Do this by reading food labels and learning the standard serving sizes of foods. Follow your sick-day plan whenever you cannot eat or drink as usual. Make this plan in advance with your health care provider.  Activity Stay active. Exercise regularly, as told by your health care provider. This may include: Stretching and doing strength exercises, such as yoga or weight lifting, two or more times a week. Doing 150 minutes or more of moderate-intensity or vigorous-intensity exercise each week. This could be brisk walking, biking, or water aerobics. Spread out your activity over 3 or more days of the week. Do not go more than 2 days in a row without doing some kind of physical activity. When you start a new exercise or activity, work with your health care provider to adjust your insulin, medicines, or food intake as needed. Lifestyle Do not use any products that contain nicotine or tobacco. These products include cigarettes, chewing tobacco, and vaping devices, such as e-cigarettes. If you need help quitting, ask your health care provider. If you drink alcohol and your health care provider says that it is safe for you: Limit how much you have to: 0-1 drink a day for women who are not pregnant. 0-2 drinks a day for men. Know how much alcohol is in your drink. In the [place] one drink equals one 12 oz bottle of beer (355 mL), one 5 oz glass of wine (148 mL), or one 1 oz glass of hard liquor (44 mL). Learn to manage stress. If you need help with this, ask your health care provider. Take care of your body  Keep your  immunizations up to date. In addition to getting vaccinations as told by your health care provider, it is recommended that you get vaccinated against the following illnesses: The flu (influenza). Get a flu shot every year. Pneumonia. Hepatitis B. Schedule an eye exam soon after your diagnosis, and then one time every year after that. Check your skin and feet every day for cuts, bruises, redness, blisters, or sores. Schedule a foot exam with your health care provider once every year. Brush your teeth and gums two times a day, and floss one or more times a day. Visit your dentist one or more times every 6 months. Maintain a healthy weight. General instructions Share your diabetes management plan with people in your workplace, school, and household. Carry a medical alert card or wear medical alert jewelry. Keep all follow-up visits. This is important. Questions to ask your health care provider Should I meet with a certified diabetes care and education specialist? Where can I find a support group for people with diabetes? Where to find more information For help and guidance and for more information about diabetes, please visit: American Diabetes Association (ADA): www.diabetes.org American Association of Diabetes Care and Education Specialists (ADCES): www.diabeteseducator.org International Diabetes Federation (IDF): DCOnly.dk Summary Caring for yourself after you have been diagnosed with type 2 diabetes (type 2 diabetes mellitus)  means keeping your blood sugar (glucose) under control with a balance of nutrition, exercise, lifestyle changes, and medicine. Check your blood glucose every day, as often as told by your health care provider. Having diabetes can put you at risk for other long-term (chronic) conditions, such as heart disease and kidney disease. Your health care provider may prescribe medicines to help prevent complications from diabetes. Share your diabetes management plan with people  in your workplace, school, and household. Keep all follow-up visits. This is important. This information is not intended to replace advice given to you by your health care provider. Make sure you discuss any questions you have with your health care provider. Document Revised: 09/14/2020 Document Reviewed: 09/14/2020 Elsevier Patient Education  2024 [employer].

## 2023-08-26 NOTE — Progress Notes (Unsigned)
 Subjective:  Patient ID: Zachary Landsman., male    DOB: 12-Jan-1956  Age: 68 y.o. MRN: [id number]  CC:  Chief Complaint  Patient presents with   Medical Management of Chronic Issues    Pt notes no new concerns, PT seemed to help significantly, notes he could not continue after week 3    HPI Zachary Landsman. presents for   Diabetes: Prior prediabetes but A1c has been at diabetic level past few times, and slightly higher at 7.0 on most recent testing.  Weight was up last visit, initially recommended diet/exercise approach with weight loss, with option to start medication. He is on statin with Lipitor, ARB with losartan . Cut back on starches - weight down 10# - gym exercise 2 days per week.  Home readings - 100-140. No 200's.   Lab Results  Component Value Date   HGBA1C 7.0 (H) 06/21/2023   HGBA1C 6.7 (H) 03/15/2023   HGBA1C 6.5 12/14/2022   Lab Results  Component Value Date   LDLCALC 92 03/15/2023   CREATININE 0.82 06/21/2023   Wt Readings from Last 3 Encounters:  08/26/23 (!) 308 lb (139.7 kg)  06/21/23 (!) 318 lb (144.2 kg)  03/15/23 (!) 310 lb (140.6 kg)   Hypertension: Elevated at his last visit, episodic missed doses of medications.  Avoidance of sodium in foods discussed, handout given with salty 6 inconsistent use of medication recommended.  Improved in office today.  Prior hyponatremia improved on repeat testing. Home readings: 130/70-80.  BP Readings from Last 3 Encounters:  08/26/23 136/80  06/21/23 (!) 146/90  03/15/23 128/74   Lab Results  Component Value Date   NA 136 06/21/2023   K 3.8 06/21/2023   CL 96 06/21/2023   CO2 29 06/21/2023    Lab Results  Component Value Date   CREATININE 0.82 06/21/2023   Chronic low back pain Discussed in February, referred to physical therapy.  Updated imaging ordered, but no fall/injury.  Has not had imaging performed.   Physical therapy has been helpful. He had to end PT - cost prohibitive - on HEP -  continuing these has been helpful.  Getting better.  Only taking alleve on occasion. Not frequently.    History Patient Active Problem List   Diagnosis Date Noted   Status post total left knee replacement 11/03/2021   Unilateral primary osteoarthritis, left knee 11/02/2021   OBESITY 05/09/2009   Essential hypertension 05/09/2009   HEMOCCULT POSITIVE STOOL 05/09/2009   HEART MURMUR, HX OF 05/09/2009   Past Medical History:  Diagnosis Date   Allergy    Arthritis    Asthma    Blood transfusion without reported diagnosis    as child   Constipation    Heart murmur    mild   Hyperlipidemia    slight elevation   Hypertension    Past Surgical History:  Procedure Laterality Date   APPENDECTOMY     age 40   COLONOSCOPY  2008   KNEE SURGERY Left 1982   pilonidal abcess     x 2 surgeries to correct this   TONSILLECTOMY     as a child   TOTAL KNEE ARTHROPLASTY Left 11/03/2021   Procedure: LEFT TOTAL KNEE ARTHROPLASTY;  Surgeon: Arnie Lao, MD;  Location: [facility];  Service: Orthopedics;  Laterality: Left;   Allergies  Allergen Reactions   Penicillins Rash   Prior to Admission medications   Medication Sig Start Date End Date Taking? Authorizing Provider  amLODipine  (NORVASC )  10 MG tablet Take 1 tablet (10 mg total) by mouth daily. 06/21/23   Benjiman Bras, MD  aspirin  81 MG chewable tablet Chew 1 tablet (81 mg total) by mouth 2 (two) times daily. 11/05/21   Arnie Lao, MD  atorvastatin  (LIPITOR) 10 MG tablet TAKE 1 TABLET BY MOUTH ONCE DAILY. 06/21/23   Benjiman Bras, MD  doxazosin  (CARDURA ) 4 MG tablet Take 1 tablet (4 mg total) by mouth at bedtime. 06/21/23   Benjiman Bras, MD  fluticasone  (FLONASE ) 50 MCG/ACT nasal spray Place 2 sprays into both nostrils daily. Patient not taking: Reported on 06/21/2023 05/18/22   Benjiman Bras, MD  ibuprofen (ADVIL) 200 MG tablet Take 400 mg by mouth every 6 (six) hours as needed for moderate pain.     [provider]  losartan -hydrochlorothiazide  (HYZAAR) 100-25 MG tablet Take 1 tablet by mouth daily. 06/21/23   Benjiman Bras, MD  metoprolol  succinate (TOPROL -XL) 100 MG 24 hr tablet TAKE 1 TABLET BY MOUTH ONCE DAILY. TAKE WITH OR IMMEDIATELY FOLLOWING A MEAL 06/21/23   Benjiman Bras, MD   Social History   Socioeconomic History   Marital status: Married    Spouse name: Not on file   Number of children: Not on file   Years of education: Not on file   Highest education level: Bachelor's degree (e.g., BA, AB, BS)  Occupational History   Not on file  Tobacco Use   Smoking status: Former    Current packs/day: 0.00    Average packs/day: 0.5 packs/day for 10.0 years (5.0 ttl pk-yrs)    Types: Cigarettes    Start date: 02/08/1969    Quit date: 02/09/1979    Years since quitting: 44.5   Smokeless tobacco: Never  Vaping Use   Vaping status: Never Used  Substance and Sexual Activity   Alcohol use: Yes    Alcohol/week: 0.0 standard drinks of alcohol    Comment: occ glass of wine    Drug use: Yes    Types: Marijuana   Sexual activity: Yes  Other Topics Concern   Not on file  Social History Narrative   Not on file   Social Drivers of Health   Financial Resource Strain: Low Risk  (06/21/2023)   Overall Financial Resource Strain (CARDIA)    Difficulty of Paying Living Expenses: Not very hard  Food Insecurity: No Food Insecurity (06/21/2023)   Hunger Vital Sign    Worried About Running Out of Food in the Last Year: Never true    Ran Out of Food in the Last Year: Never true  Transportation Needs: No Transportation Needs (06/21/2023)   PRAPARE - Administrator, Civil Service (Medical): No    Lack of Transportation (Non-Medical): No  Physical Activity: Sufficiently Active (06/21/2023)   Exercise Vital Sign    Days of Exercise per Week: 3 days    Minutes of Exercise per Session: 60 min  Stress: No Stress Concern Present (06/21/2023)   [employer] of  Occupational Health - Occupational Stress Questionnaire    Feeling of Stress : Only a little  Social Connections: Socially Integrated (06/21/2023)   Social Connection and Isolation Panel [NHANES]    Frequency of Communication with Friends and Family: More than three times a week    Frequency of Social Gatherings with Friends and Family: Three times a week    Attends Religious Services: More than 4 times per year    Active Member of Clubs or Organizations: Yes  Attends Banker Meetings: More than 4 times per year    Marital Status: Married  Catering manager Violence: Not on file    Review of Systems  Constitutional:  Negative for fatigue and unexpected weight change.  Eyes:  Negative for visual disturbance.  Respiratory:  Negative for cough, chest tightness and shortness of breath.   Cardiovascular:  Negative for chest pain, palpitations and leg swelling.  Gastrointestinal:  Negative for abdominal pain and blood in stool.  Neurological:  Negative for dizziness, light-headedness and headaches.     Objective:   Vitals:   08/26/23 1520  BP: 136/80  Pulse: (!) 103  Temp: 97.8 F (36.6 C)  TempSrc: Temporal  SpO2: 99%  Weight: (!) 308 lb (139.7 kg)  Height: 6\' 1"  (1.854 m)     Physical Exam Vitals reviewed.  Constitutional:      Appearance: He is well-developed.  HENT:     Head: Normocephalic and atraumatic.  Neck:     Vascular: No carotid bruit or JVD.  Cardiovascular:     Rate and Rhythm: Normal rate and regular rhythm.     Heart sounds: Normal heart sounds. No murmur heard. Pulmonary:     Effort: Pulmonary effort is normal.     Breath sounds: Normal breath sounds. No rales.  Musculoskeletal:     Right lower leg: No edema.     Left lower leg: No edema.  Skin:    General: Skin is warm and dry.  Neurological:     Mental Status: He is alert and oriented to person, place, and time.  Psychiatric:        Mood and Affect: Mood normal.         Assessment & Plan:  OLUWATIMILEYIN HEMP. is a 68 y.o. male . No diagnosis found.   No orders of the defined types were placed in this encounter.  There are no Patient Instructions on file for this visit.    Signed,   Caro Christmas, MD [facility], [facility] 08/26/23 3:51 PM

## 2023-08-27 ENCOUNTER — Encounter: Payer: Self-pay | Admitting: Family Medicine

## 2023-09-07 IMAGING — DX DG CHEST 1V PORT
1 series · 2 of 2 positions shown · non-contrast
Comparison: 06/23/2021

CLINICAL DATA: Chest pain

EXAM:
PORTABLE CHEST 1 VIEW

[Series 1: chest · 0.14mm/px · 2 of 2 slices shown]
[im 1/2]
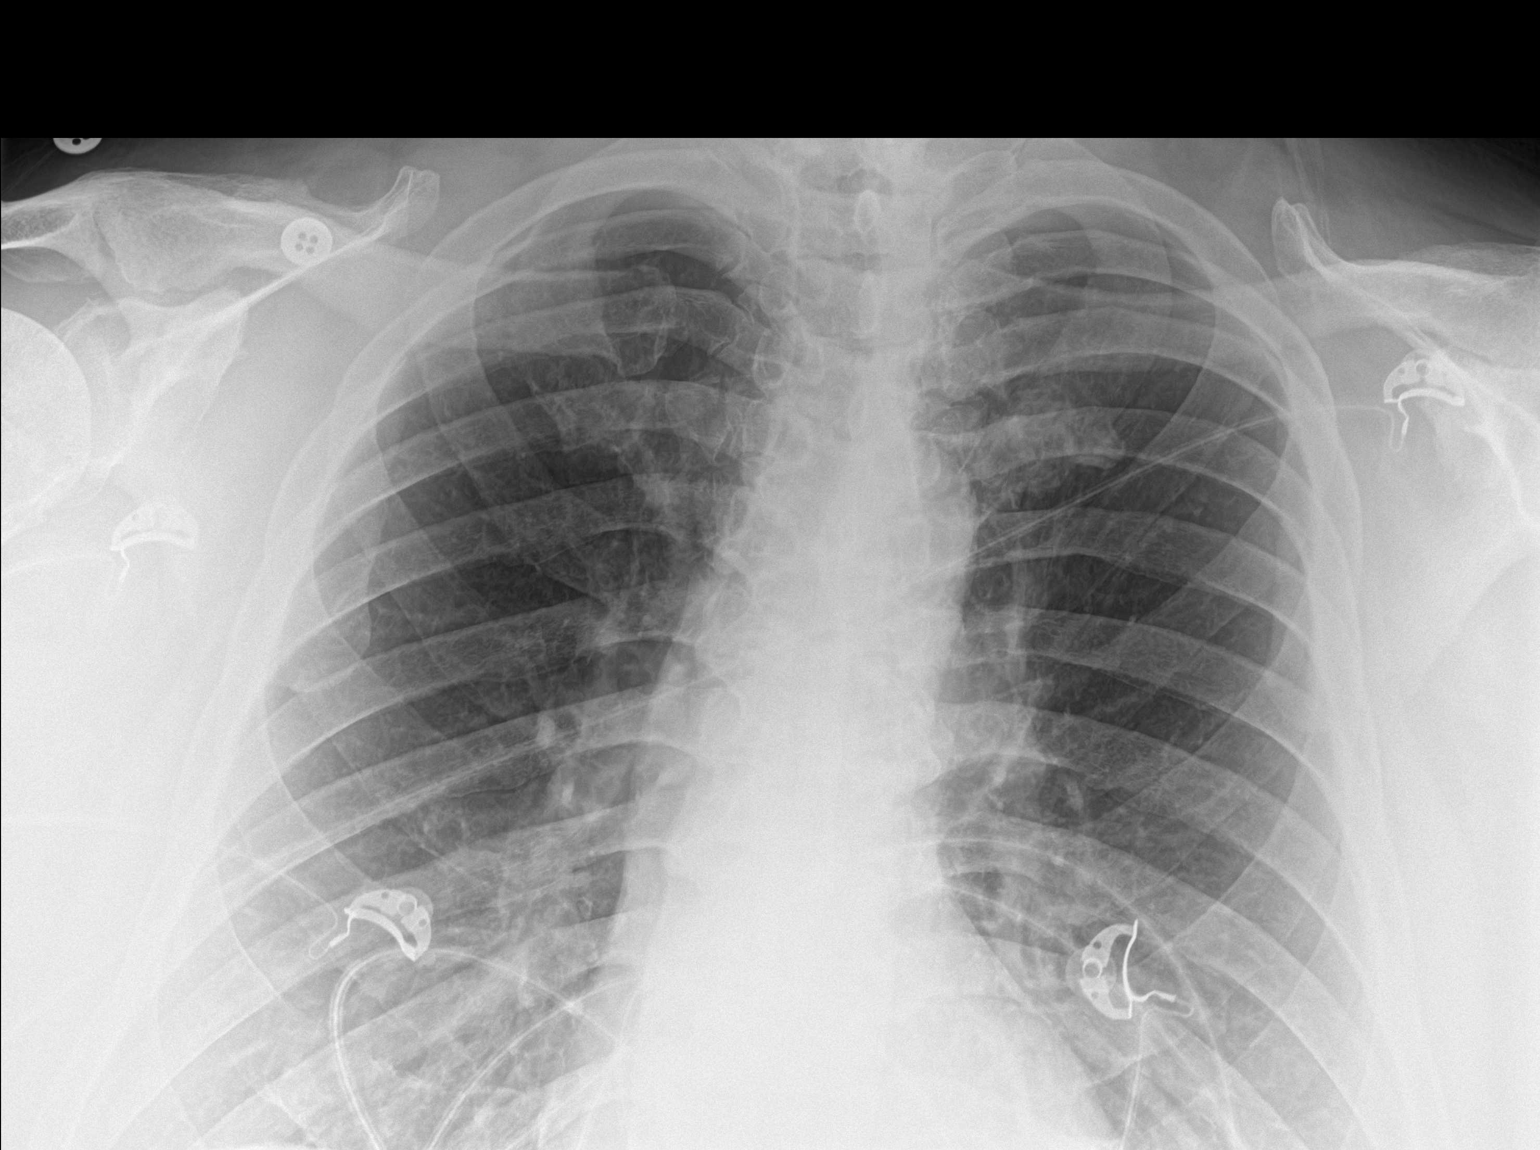
[im 2/2]
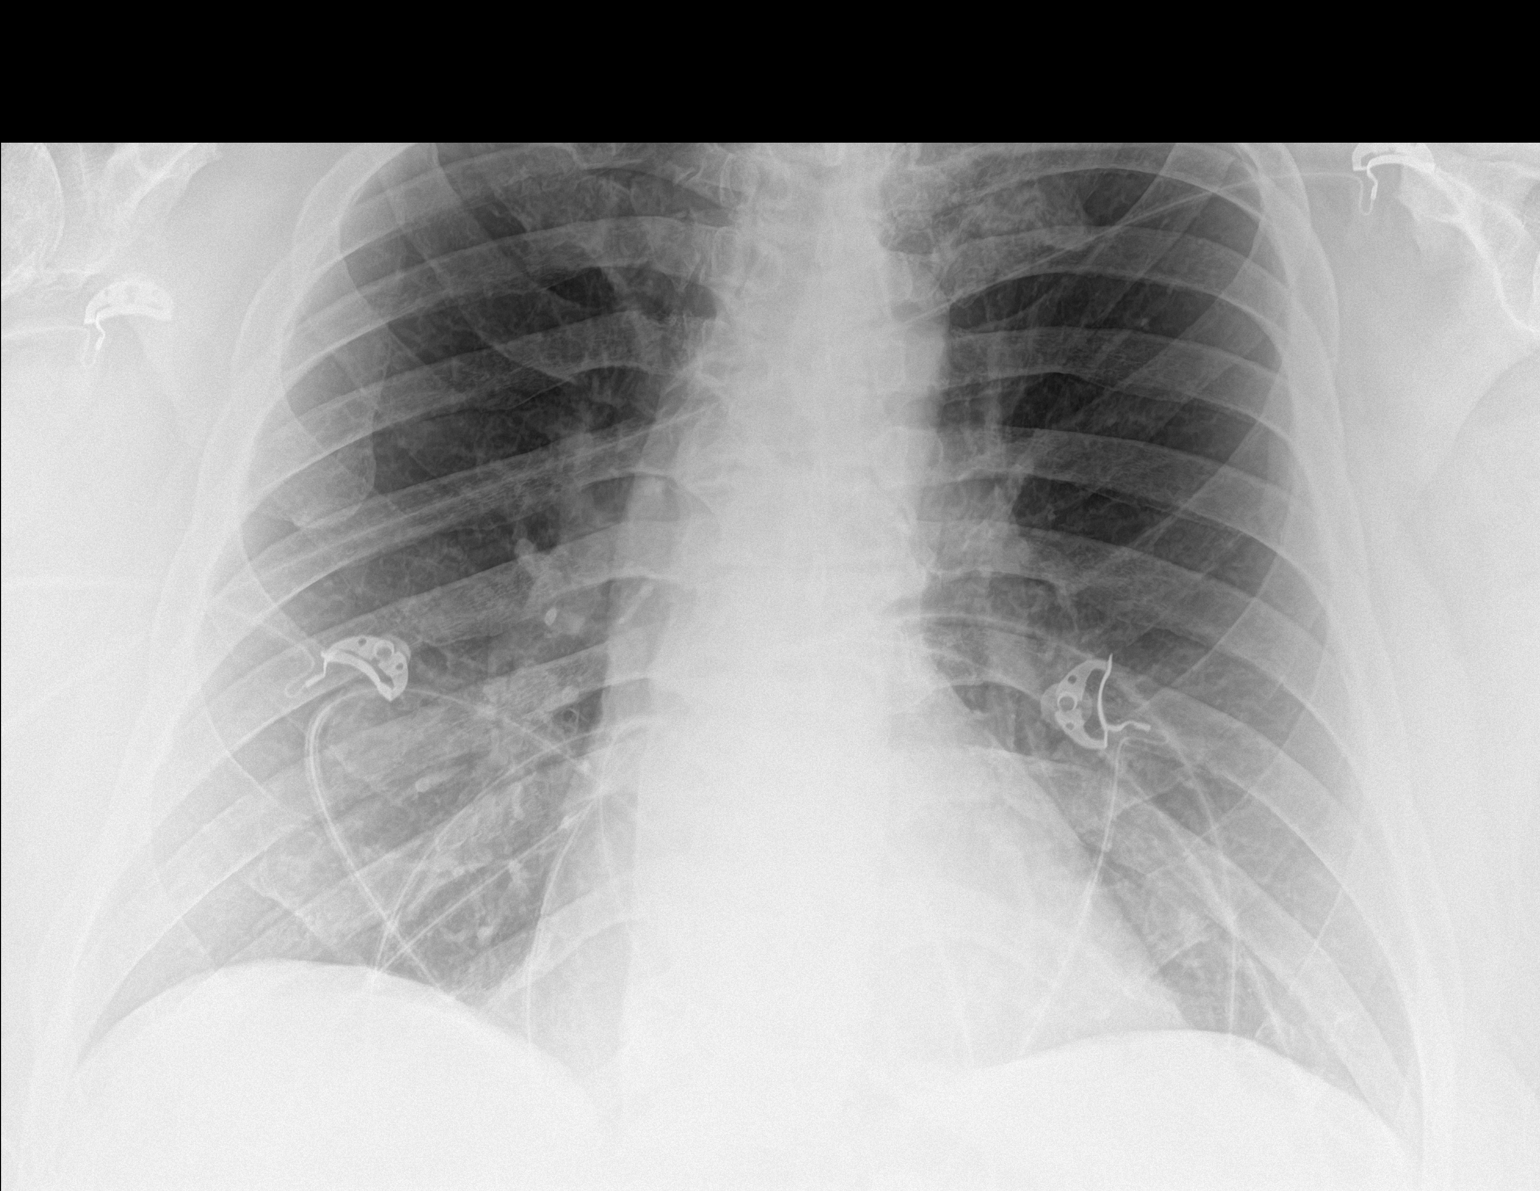

[2 of 2 positions shown; findings below may reference images not displayed]

FINDINGS: Heart and mediastinal contours are within normal limits. No focal
opacities or effusions. No acute bony abnormality.
IMPRESSION: No active disease.

## 2023-09-25 ENCOUNTER — Other Ambulatory Visit (INDEPENDENT_AMBULATORY_CARE_PROVIDER_SITE_OTHER)

## 2023-09-25 DIAGNOSIS — E669 Obesity, unspecified: Secondary | ICD-10-CM

## 2023-09-25 DIAGNOSIS — E1169 Type 2 diabetes mellitus with other specified complication: Secondary | ICD-10-CM

## 2023-09-25 DIAGNOSIS — E785 Hyperlipidemia, unspecified: Secondary | ICD-10-CM | POA: Diagnosis not present

## 2023-09-25 DIAGNOSIS — I10 Essential (primary) hypertension: Secondary | ICD-10-CM

## 2023-09-25 LAB — COMPREHENSIVE METABOLIC PANEL WITH GFR
ALT: 36 U/L (ref 0–53)
AST: 28 U/L (ref 0–37)
Albumin: 4.5 g/dL (ref 3.5–5.2)
Alkaline Phosphatase: 68 U/L (ref 39–117)
BUN: 20 mg/dL (ref 6–23)
CO2: 30 meq/L (ref 19–32)
Calcium: 9.8 mg/dL (ref 8.4–10.5)
Chloride: 97 meq/L (ref 96–112)
Creatinine, Ser: 0.87 mg/dL (ref 0.40–1.50)
GFR: 89.52 mL/min (ref >60.00)
Glucose, Bld: 148 mg/dL — ABNORMAL HIGH (ref 70–99)
Potassium: 4.1 meq/L (ref 3.5–5.1)
Sodium: 136 meq/L (ref 135–145)
Total Bilirubin: 0.5 mg/dL (ref 0.2–1.2)
Total Protein: 8 g/dL (ref 6.0–8.3)

## 2023-09-25 LAB — LIPID PANEL
Cholesterol: 129 mg/dL (ref 0–200)
HDL: 44.8 mg/dL (ref >39.00)
LDL Cholesterol: 68 mg/dL (ref 0–99)
NonHDL: 84.04
Total CHOL/HDL Ratio: 3
Triglycerides: 78 mg/dL (ref 0.0–149.0)
VLDL: 15.6 mg/dL (ref 0.0–40.0)

## 2023-09-25 LAB — MICROALBUMIN / CREATININE URINE RATIO
Creatinine,U: 62.1 mg/dL
Microalb Creat Ratio: UNDETERMINED mg/g (ref 0.0–30.0)
Microalb, Ur: <0.7 mg/dL

## 2023-09-25 LAB — HEMOGLOBIN A1C: Hgb A1c MFr Bld: 7.1 % — ABNORMAL HIGH (ref 4.6–6.5)

## 2023-10-01 ENCOUNTER — Ambulatory Visit: Payer: Self-pay | Admitting: Family Medicine

## 2023-12-26 ENCOUNTER — Ambulatory Visit: Admitting: Family Medicine

## 2023-12-27 ENCOUNTER — Ambulatory Visit: Admitting: Family Medicine

## 2023-12-27 ENCOUNTER — Encounter: Payer: Self-pay | Admitting: Family Medicine

## 2023-12-27 VITALS — BP 132/76 | HR 109 | Temp 98.3°F | Resp 19 | Ht 73.0 in | Wt 299.0 lb

## 2023-12-27 DIAGNOSIS — E669 Obesity, unspecified: Secondary | ICD-10-CM | POA: Diagnosis not present

## 2023-12-27 DIAGNOSIS — E785 Hyperlipidemia, unspecified: Secondary | ICD-10-CM | POA: Diagnosis not present

## 2023-12-27 DIAGNOSIS — F439 Reaction to severe stress, unspecified: Secondary | ICD-10-CM | POA: Diagnosis not present

## 2023-12-27 DIAGNOSIS — E782 Mixed hyperlipidemia: Secondary | ICD-10-CM | POA: Insufficient documentation

## 2023-12-27 DIAGNOSIS — I10 Essential (primary) hypertension: Secondary | ICD-10-CM | POA: Diagnosis not present

## 2023-12-27 DIAGNOSIS — E1169 Type 2 diabetes mellitus with other specified complication: Secondary | ICD-10-CM

## 2023-12-27 LAB — COMPREHENSIVE METABOLIC PANEL WITH GFR
ALT: 30 U/L (ref 0–53)
AST: 25 U/L (ref 0–37)
Albumin: 4.4 g/dL (ref 3.5–5.2)
Alkaline Phosphatase: 62 U/L (ref 39–117)
BUN: 16 mg/dL (ref 6–23)
CO2: 26 meq/L (ref 19–32)
Calcium: 9.4 mg/dL (ref 8.4–10.5)
Chloride: 99 meq/L (ref 96–112)
Creatinine, Ser: 0.71 mg/dL (ref 0.40–1.50)
GFR: 95.02 mL/min (ref >60.00)
Glucose, Bld: 156 mg/dL — ABNORMAL HIGH (ref 70–99)
Potassium: 3.7 meq/L (ref 3.5–5.1)
Sodium: 136 meq/L (ref 135–145)
Total Bilirubin: 0.6 mg/dL (ref 0.2–1.2)
Total Protein: 8.2 g/dL (ref 6.0–8.3)

## 2023-12-27 LAB — HEMOGLOBIN A1C: Hgb A1c MFr Bld: 7.6 % — ABNORMAL HIGH (ref 4.6–6.5)

## 2023-12-27 MED ORDER — METOPROLOL SUCCINATE ER 100 MG PO TB24
ORAL_TABLET | ORAL | 1 refills | Status: AC
Start: 2023-12-27 — End: ?

## 2023-12-27 MED ORDER — ATORVASTATIN CALCIUM 10 MG PO TABS
ORAL_TABLET | ORAL | 1 refills | Status: AC
Start: 2023-12-27 — End: ?

## 2023-12-27 MED ORDER — AMLODIPINE BESYLATE 10 MG PO TABS: 10.0000 mg | ORAL_TABLET | Freq: Every day | ORAL | 1 refills | Status: AC

## 2023-12-27 MED ORDER — LOSARTAN POTASSIUM-HCTZ 100-25 MG PO TABS: 1.0000 | ORAL_TABLET | Freq: Every day | ORAL | 1 refills | Status: AC

## 2023-12-27 MED ORDER — DOXAZOSIN MESYLATE 4 MG PO TABS: 4.0000 mg | ORAL_TABLET | Freq: Every day | ORAL | 1 refills | Status: AC

## 2023-12-27 NOTE — Patient Instructions (Signed)
 No change in meds at this time.  See information below regarding managing stress.  Please let me know if I can help further or if those symptoms persist.  Continue to invest in activities you enjoy to recharge the battery.   If any concerns on labs I will let you know, follow-up in 3 months but let me know if there are questions in the meantime.  Take care!  Managing Stress, Adult Feeling a certain amount of stress is normal. Stress helps our body and mind get ready to deal with the demands of life. Stress hormones can motivate you to do well at work and meet your responsibilities. But severe or long-term (chronic) stress can affect your mental and physical health. Chronic stress puts you at higher risk for: Anxiety and depression. Other health problems such as digestive problems, muscle aches, heart disease, high blood pressure, and stroke. What are the causes? Common causes of stress include: Demands from work, such as deadlines, feeling overworked, or having long hours. Pressures at home, such as money issues, disagreements with a spouse, or parenting issues. Pressures from major life changes, such as divorce, moving, loss of a loved one, or chronic illness. You may be at higher risk for stress-related problems if you: Do not get enough sleep. Are in poor health. Do not have emotional support. Have a mental health disorder such as anxiety or depression. How to recognize stress Stress can make you: Have trouble sleeping. Feel sad, anxious, irritable, or overwhelmed. Lose your appetite. Overeat or want to eat unhealthy foods. Want to use drugs or alcohol. Stress can also cause physical symptoms, such as: Sore, tense muscles, especially in the shoulders and neck. Headaches. Trouble breathing. A faster heart rate. Stomach pain, nausea, or vomiting. Diarrhea or constipation. Trouble concentrating. Follow these instructions at home: Eating and drinking Eat a healthy diet. This  includes: Eating foods that are high in fiber, such as beans, whole grains, and fresh fruits and vegetables. Limiting foods that are high in fat and processed sugars, such as fried or sweet foods. Do not skip meals or overeat. Drink enough fluid to keep your urine pale yellow. Alcohol use Do not drink alcohol if: Your health care provider tells you not to drink. You are pregnant, may be pregnant, or are planning to become pregnant. Drinking alcohol is a way some people try to ease their stress. This can be dangerous, so if you drink alcohol: Limit how much you have to: 0-1 drink a day for women. 0-2 drinks a day for men. Know how much alcohol is in your drink. In the [place] one drink equals one 12 oz bottle of beer (355 mL), one 5 oz glass of wine (148 mL), or one 1 oz glass of hard liquor (44 mL). Activity  Include 30 minutes of exercise in your daily schedule. Exercise is a good stress reducer. Include time in your day for an activity that you find relaxing. Try taking a walk, going on a bike ride, reading a book, or listening to music. Schedule your time in a way that lowers stress, and keep a regular schedule. Focus on doing what is most important to get done. Lifestyle Identify the source of your stress and your reaction to it. See a therapist who can help you change unhelpful reactions. When there are stressful events: Talk about them with family, friends, or coworkers. Try to think realistically about stressful events and not ignore them or overreact. Try to find the positives in a  stressful situation and not focus on the negatives. Cut back on responsibilities at work and home, if possible. Ask for help from friends or family members if you need it. Find ways to manage stress, such as: Mindfulness, meditation, or deep breathing. Yoga or tai chi. Progressive muscle relaxation. Spending time in nature. Doing art, playing music, or reading. Making time for fun  activities. Spending time with family and friends. Get support from family, friends, or spiritual resources. General instructions Get enough sleep. Try to go to sleep and get up at about the same time every day. Take over-the-counter and prescription medicines only as told by your health care provider. Do not use any products that contain nicotine or tobacco. These products include cigarettes, chewing tobacco, and vaping devices, such as e-cigarettes. If you need help quitting, ask your health care provider. Do not use drugs or smoke to deal with stress. Keep all follow-up visits. This is important. Where to find support Talk with your health care provider about stress management or finding a support group. Find a therapist to work with you on your stress management techniques. Where to find more information [employer] on Mental Illness: www.nami.org American Psychological Association: [employer] Contact a health care provider if: Your stress symptoms get worse. You are unable to manage your stress at home. You are struggling to stop using drugs or alcohol. Get help right away if: You may be a danger to yourself or others. You have any thoughts of death or suicide. Get help right awayif you feel like you may hurt yourself or others, or have thoughts about taking your own life. Go to your nearest emergency room or: Call 911. Call the National Suicide Prevention Lifeline at 512-027-6444 or 988 in the [place] This is open 24 hours a day. If you're a Veteran: Call 988 and press 1. This is open 24 hours a day. Text the [employer] at 380 347 4752. Summary Feeling a certain amount of stress is normal, but severe or long-term (chronic) stress can affect your mental and physical health. Chronic stress can put you at higher risk for anxiety, depression, and other health problems such as digestive problems, muscle aches, heart disease, high blood pressure, and stroke. You may be at  higher risk for stress-related problems if you do not get enough sleep, are in poor health, lack emotional support, or have a mental health disorder such as anxiety or depression. Identify the source of your stress and your reaction to it. Try talking about stressful events with family, friends, or coworkers, finding a coping method, or getting support from spiritual resources. If you need more help, talk with your health care provider about finding a support group or a mental health therapist. This information is not intended to replace advice given to you by your health care provider. Make sure you discuss any questions you have with your health care provider. Document Revised: 11/29/2022 Document Reviewed: 11/08/2020 Elsevier Patient Education  2024 [employer].

## 2023-12-27 NOTE — Progress Notes (Signed)
 Subjective:  Patient ID: Zachary CHRISTELLA LEVORA Mickey., male    DOB: [birth year]  Age: 68 y.o. MRN: [id number]  CC:  Chief Complaint  Patient presents with   Follow-up    4wk check and labs. No questions or concerns. Having a lot of anxiety with work    HPI [employer]. presents for   Anxiety/situational stress Work stressors. CAD work. Overwhelming lately. Some stress with church leadership, wife is the pastor. Stress with leadership, stress with church and members. Worried about effect on her as well. More stress past 3 months.  Stress mgt./coping: exercise when able (3x/week), spending time with grandkids.  Declines meds at this time. May be making some changes with church.  Feels like ok with work.  Sleeping ok.  Rare alcohol.  Denies SI.      12/27/2023    9:50 AM 06/21/2023    9:16 AM 03/15/2023    8:09 AM 12/14/2022    8:25 AM 05/18/2022    8:52 AM  Depression screen PHQ 2/9  Decreased Interest 0 0 0 0 0  Down, Depressed, Hopeless 0 0 0 0 0  PHQ - 2 Score 0 0 0 0 0  Altered sleeping 0 0 0 0 0  Tired, decreased energy 0 1 0 0 0  Change in appetite 0 1 1 0 0  Feeling bad or failure about yourself  0 0 0 0 0  Trouble concentrating 0 0 0 0 0  Moving slowly or fidgety/restless 0 0 0 0 0  Suicidal thoughts 0 0 0 0 0  PHQ-9 Score 0 2 1 0 0  Difficult doing work/chores Not difficult at all Not difficult at all        Diabetes: With prior prediabetes.  Diet/exercise approach planned.  He is on statin with Lipitor, ARB with losartan .  His weight had improved at his last visit in April with 10 pound weight loss from cutting back on starches and gym exercise few days per week. Has lost another 9#.  Walking 3-4k steps per day.  Home readings: 120 this am.  Microalbumin: normal ratio 09/25/23.  Optho, foot exam, pneumovax: recommended optho visit.  Shingles and flu vaccine recommended at pharmacy.   Lab Results  Component Value Date   HGBA1C 7.1 (H) 09/25/2023   HGBA1C 7.0  (H) 06/21/2023   HGBA1C 6.7 (H) 03/15/2023   Lab Results  Component Value Date   MICROALBUR <0.7 09/25/2023   LDLCALC 68 09/25/2023   CREATININE 0.87 09/25/2023   Wt Readings from Last 3 Encounters:  12/27/23 299 lb (135.6 kg)  08/26/23 (!) 308 lb (139.7 kg)  06/21/23 (!) 318 lb (144.2 kg)    Hypertension: Dietary guidance given previously including avoidance of salt, continued on same regimen.  Stable today with amlodipine , toprol , losartan  hct, and doxazosin  at night. No side effects. Overall stable urination.  Home readings: BP Readings from Last 3 Encounters:  12/27/23 132/76  08/26/23 136/80  06/21/23 (!) 146/90   Lab Results  Component Value Date   CREATININE 0.87 09/25/2023    Hyperlipidemia: Lipitor 10mg  every day - no myalgias.  Lab Results  Component Value Date   CHOL 129 09/25/2023   HDL 44.80 09/25/2023   LDLCALC 68 09/25/2023   TRIG 78.0 09/25/2023   CHOLHDL 3 09/25/2023   Lab Results  Component Value Date   ALT 36 09/25/2023   AST 28 09/25/2023   ALKPHOS 68 09/25/2023   BILITOT 0.5 09/25/2023  History Patient Active Problem List   Diagnosis Date Noted   Mixed hyperlipidemia 12/27/2023   Status post total left knee replacement 11/03/2021   Unilateral primary osteoarthritis, left knee 11/02/2021   OBESITY 05/09/2009   Essential hypertension 05/09/2009   HEMOCCULT POSITIVE STOOL 05/09/2009   HEART MURMUR, HX OF 05/09/2009   Past Medical History:  Diagnosis Date   Allergy    Arthritis    Asthma    Blood transfusion without reported diagnosis    as child   Constipation    Heart murmur    mild   Hyperlipidemia    slight elevation   Hypertension    Past Surgical History:  Procedure Laterality Date   APPENDECTOMY     age 66   COLONOSCOPY  2008   KNEE SURGERY Left 1982   pilonidal abcess     x 2 surgeries to correct this   TONSILLECTOMY     as a child   TOTAL KNEE ARTHROPLASTY Left 11/03/2021   Procedure: LEFT TOTAL KNEE  ARTHROPLASTY;  Surgeon: Vernetta Lonni GRADE, MD;  Location: [facility];  Service: Orthopedics;  Laterality: Left;   Allergies  Allergen Reactions   Penicillins Rash   Prior to Admission medications   Medication Sig Start Date End Date Taking? Authorizing Provider  aspirin  81 MG chewable tablet Chew 1 tablet (81 mg total) by mouth 2 (two) times daily. 11/05/21  Yes Vernetta Lonni GRADE, MD  atorvastatin  (LIPITOR) 10 MG tablet TAKE 1 TABLET BY MOUTH ONCE DAILY. 06/21/23  Yes Levora Reyes SAUNDERS, MD  doxazosin  (CARDURA ) 4 MG tablet Take 1 tablet (4 mg total) by mouth at bedtime. 06/21/23  Yes Levora Reyes SAUNDERS, MD  ibuprofen (ADVIL) 200 MG tablet Take 400 mg by mouth every 6 (six) hours as needed for moderate pain.   Yes [provider]  losartan -hydrochlorothiazide  (HYZAAR) 100-25 MG tablet Take 1 tablet by mouth daily. 06/21/23  Yes Levora Reyes SAUNDERS, MD  metoprolol  succinate (TOPROL -XL) 100 MG 24 hr tablet TAKE 1 TABLET BY MOUTH ONCE DAILY. TAKE WITH OR IMMEDIATELY FOLLOWING A MEAL 06/21/23  Yes Levora Reyes SAUNDERS, MD  amLODipine  (NORVASC ) 10 MG tablet Take 1 tablet (10 mg total) by mouth daily. 06/21/23   Levora Reyes SAUNDERS, MD  fluticasone  (FLONASE ) 50 MCG/ACT nasal spray Place 2 sprays into both nostrils daily. Patient not taking: Reported on 12/27/2023 05/18/22   Levora Reyes SAUNDERS, MD   Social History   Socioeconomic History   Marital status: Married    Spouse name: Not on file   Number of children: Not on file   Years of education: Not on file   Highest education level: Bachelor's degree (e.g., BA, AB, BS)  Occupational History   Not on file  Tobacco Use   Smoking status: Former    Current packs/day: 0.00    Average packs/day: 0.5 packs/day for 10.0 years (5.0 ttl pk-yrs)    Types: Cigarettes    Start date: 02/08/1969    Quit date: 02/09/1979    Years since quitting: 44.9   Smokeless tobacco: Never  Vaping Use   Vaping status: Never Used  Substance and Sexual Activity    Alcohol use: Yes    Alcohol/week: 0.0 standard drinks of alcohol    Comment: occ glass of wine    Drug use: Yes    Types: Marijuana   Sexual activity: Yes  Other Topics Concern   Not on file  Social History Narrative   Not on file   Social Drivers  of Health   Financial Resource Strain: Low Risk  (06/21/2023)   Overall Financial Resource Strain (CARDIA)    Difficulty of Paying Living Expenses: Not very hard  Food Insecurity: No Food Insecurity (06/21/2023)   Hunger Vital Sign    Worried About Running Out of Food in the Last Year: Never true    Ran Out of Food in the Last Year: Never true  Transportation Needs: No Transportation Needs (06/21/2023)   PRAPARE - Administrator, Civil Service (Medical): No    Lack of Transportation (Non-Medical): No  Physical Activity: Sufficiently Active (06/21/2023)   Exercise Vital Sign    Days of Exercise per Week: 3 days    Minutes of Exercise per Session: 60 min  Stress: No Stress Concern Present (06/21/2023)   [employer] of Occupational Health - Occupational Stress Questionnaire    Feeling of Stress : Only a little  Social Connections: Socially Integrated (06/21/2023)   Social Connection and Isolation Panel    Frequency of Communication with Friends and Family: More than three times a week    Frequency of Social Gatherings with Friends and Family: Three times a week    Attends Religious Services: More than 4 times per year    Active Member of Clubs or Organizations: Yes    Attends Banker Meetings: More than 4 times per year    Marital Status: Married  Catering manager Violence: Not on file    Review of Systems  Constitutional:  Negative for fatigue and unexpected weight change.  Eyes:  Negative for visual disturbance.  Respiratory:  Negative for cough, chest tightness and shortness of breath.   Cardiovascular:  Negative for chest pain, palpitations and leg swelling.  Gastrointestinal:  Negative for abdominal  pain and blood in stool.  Neurological:  Negative for dizziness, light-headedness and headaches.     Objective:   Vitals:   12/27/23 0943  BP: 132/76  Pulse: (!) 109  Resp: 19  Temp: 98.3 F (36.8 C)  TempSrc: Temporal  SpO2: 99%  Weight: 299 lb (135.6 kg)  Height: 6' 1 (1.854 m)     Physical Exam Vitals reviewed.  Constitutional:      Appearance: He is well-developed.  HENT:     Head: Normocephalic and atraumatic.  Neck:     Vascular: No carotid bruit or JVD.  Cardiovascular:     Rate and Rhythm: Normal rate and regular rhythm.     Heart sounds: Normal heart sounds. No murmur heard. Pulmonary:     Effort: Pulmonary effort is normal.     Breath sounds: Normal breath sounds. No rales.  Musculoskeletal:     Right lower leg: No edema.     Left lower leg: No edema.  Skin:    General: Skin is warm and dry.  Neurological:     Mental Status: He is alert and oriented to person, place, and time.  Psychiatric:        Mood and Affect: Mood normal.        Assessment & Plan:  Zachary SILVERS. is a 68 y.o. male . Situational stress  - Situational stressors discussed.  Handout given.  Declines need for meds, therapy at this time which I think would be reasonable with RTC precautions.  Essential hypertension - Plan: Comprehensive metabolic panel with GFR, amLODipine  (NORVASC ) 10 MG tablet, doxazosin  (CARDURA ) 4 MG tablet, losartan -hydrochlorothiazide  (HYZAAR) 100-25 MG tablet, metoprolol  succinate (TOPROL -XL) 100 MG 24 hr tablet  -  Stable,  tolerating current regimen. Medications refilled. Labs pending as above.   Hyperlipidemia, unspecified hyperlipidemia type - Plan: Comprehensive metabolic panel with GFR, atorvastatin  (LIPITOR) 10 MG tablet  -  Stable, tolerating current regimen. Medications refilled. Labs pending as above.   Type 2 diabetes mellitus with obesity (HCC) - Plan: Comprehensive metabolic panel with GFR, Hemoglobin A1c  - Check labs and adjust regimen  accordingly.  Continue to watch diet, exercise.  Meds ordered this encounter  Medications   atorvastatin  (LIPITOR) 10 MG tablet    Sig: TAKE 1 TABLET BY MOUTH ONCE DAILY.    Dispense:  90 tablet    Refill:  1   amLODipine  (NORVASC ) 10 MG tablet    Sig: Take 1 tablet (10 mg total) by mouth daily.    Dispense:  90 tablet    Refill:  1   doxazosin  (CARDURA ) 4 MG tablet    Sig: Take 1 tablet (4 mg total) by mouth at bedtime.    Dispense:  90 tablet    Refill:  1   losartan -hydrochlorothiazide  (HYZAAR) 100-25 MG tablet    Sig: Take 1 tablet by mouth daily.    Dispense:  90 tablet    Refill:  1   metoprolol  succinate (TOPROL -XL) 100 MG 24 hr tablet    Sig: TAKE 1 TABLET BY MOUTH ONCE DAILY. TAKE WITH OR IMMEDIATELY FOLLOWING A MEAL    Dispense:  90 tablet    Refill:  1   Patient Instructions  No change in meds at this time.  See information below regarding managing stress.  Please let me know if I can help further or if those symptoms persist.  Continue to invest in activities you enjoy to recharge the battery.   If any concerns on labs I Zachary let you know, follow-up in 3 months but let me know if there are questions in the meantime.  Take care!  Managing Stress, Adult Feeling a certain amount of stress is normal. Stress helps our body and mind get ready to deal with the demands of life. Stress hormones can motivate you to do well at work and meet your responsibilities. But severe or long-term (chronic) stress can affect your mental and physical health. Chronic stress puts you at higher risk for: Anxiety and depression. Other health problems such as digestive problems, muscle aches, heart disease, high blood pressure, and stroke. What are the causes? Common causes of stress include: Demands from work, such as deadlines, feeling overworked, or having long hours. Pressures at home, such as money issues, disagreements with a spouse, or parenting issues. Pressures from major life  changes, such as divorce, moving, loss of a loved one, or chronic illness. You may be at higher risk for stress-related problems if you: Do not get enough sleep. Are in poor health. Do not have emotional support. Have a mental health disorder such as anxiety or depression. How to recognize stress Stress can make you: Have trouble sleeping. Feel sad, anxious, irritable, or overwhelmed. Lose your appetite. Overeat or want to eat unhealthy foods. Want to use drugs or alcohol. Stress can also cause physical symptoms, such as: Sore, tense muscles, especially in the shoulders and neck. Headaches. Trouble breathing. A faster heart rate. Stomach pain, nausea, or vomiting. Diarrhea or constipation. Trouble concentrating. Follow these instructions at home: Eating and drinking Eat a healthy diet. This includes: Eating foods that are high in fiber, such as beans, whole grains, and fresh fruits and vegetables. Limiting foods that are high in fat and  processed sugars, such as fried or sweet foods. Do not skip meals or overeat. Drink enough fluid to keep your urine pale yellow. Alcohol use Do not drink alcohol if: Your health care provider tells you not to drink. You are pregnant, may be pregnant, or are planning to become pregnant. Drinking alcohol is a way some people try to ease their stress. This can be dangerous, so if you drink alcohol: Limit how much you have to: 0-1 drink a day for women. 0-2 drinks a day for men. Know how much alcohol is in your drink. In the [place] one drink equals one 12 oz bottle of beer (355 mL), one 5 oz glass of wine (148 mL), or one 1 oz glass of hard liquor (44 mL). Activity  Include 30 minutes of exercise in your daily schedule. Exercise is a good stress reducer. Include time in your day for an activity that you find relaxing. Try taking a walk, going on a bike ride, reading a book, or listening to music. Schedule your time in a way that lowers stress, and  keep a regular schedule. Focus on doing what is most important to get done. Lifestyle Identify the source of your stress and your reaction to it. See a therapist who can help you change unhelpful reactions. When there are stressful events: Talk about them with family, friends, or coworkers. Try to think realistically about stressful events and not ignore them or overreact. Try to find the positives in a stressful situation and not focus on the negatives. Cut back on responsibilities at work and home, if possible. Ask for help from friends or family members if you need it. Find ways to manage stress, such as: Mindfulness, meditation, or deep breathing. Yoga or tai chi. Progressive muscle relaxation. Spending time in nature. Doing art, playing music, or reading. Making time for fun activities. Spending time with family and friends. Get support from family, friends, or spiritual resources. General instructions Get enough sleep. Try to go to sleep and get up at about the same time every day. Take over-the-counter and prescription medicines only as told by your health care provider. Do not use any products that contain nicotine or tobacco. These products include cigarettes, chewing tobacco, and vaping devices, such as e-cigarettes. If you need help quitting, ask your health care provider. Do not use drugs or smoke to deal with stress. Keep all follow-up visits. This is important. Where to find support Talk with your health care provider about stress management or finding a support group. Find a therapist to work with you on your stress management techniques. Where to find more information [employer] on Mental Illness: www.nami.org American Psychological Association: DiceTour[employer].ca Contact a health care provider if: Your stress symptoms get worse. You are unable to manage your stress at home. You are struggling to stop using drugs or alcohol. Get help right away if: You may be a  danger to yourself or others. You have any thoughts of death or suicide. Get help right awayif you feel like you may hurt yourself or others, or have thoughts about taking your own life. Go to your nearest emergency room or: Call 911. Call the National Suicide Prevention Lifeline at (512) 450-8051 or 988 in the [place] This is open 24 hours a day. If you're a Veteran: Call 988 and press 1. This is open 24 hours a day. Text the [employer] at (479)675-4304. Summary Feeling a certain amount of stress is normal, but severe or long-term (chronic) stress  can affect your mental and physical health. Chronic stress can put you at higher risk for anxiety, depression, and other health problems such as digestive problems, muscle aches, heart disease, high blood pressure, and stroke. You may be at higher risk for stress-related problems if you do not get enough sleep, are in poor health, lack emotional support, or have a mental health disorder such as anxiety or depression. Identify the source of your stress and your reaction to it. Try talking about stressful events with family, friends, or coworkers, finding a coping method, or getting support from spiritual resources. If you need more help, talk with your health care provider about finding a support group or a mental health therapist. This information is not intended to replace advice given to you by your health care provider. Make sure you discuss any questions you have with your health care provider. Document Revised: 11/29/2022 Document Reviewed: 11/08/2020 Elsevier Patient Education  2024 Elsevier Inc.    Signed,   Reyes Pines, MD [facility], [facility] 12/27/23 10:23 AM

## 2023-12-30 ENCOUNTER — Ambulatory Visit: Payer: Self-pay | Admitting: Family Medicine

## 2024-04-06 ENCOUNTER — Ambulatory Visit: Admitting: Family Medicine

## 2024-05-25 ENCOUNTER — Ambulatory Visit: Admitting: Family Medicine

## 2024-06-17 ENCOUNTER — Ambulatory Visit: Admitting: Family Medicine
